# Patient Record
Sex: Male | Born: 1940 | Race: White | Hispanic: No | Smoking: Never smoker
Health system: Southern US, Community
[De-identification: ages and names within clinical notes are randomized; demographics above are authoritative.]

## PROBLEM LIST (undated history)

## (undated) DIAGNOSIS — I712 Thoracic aortic aneurysm, without rupture: Secondary | ICD-10-CM

## (undated) DIAGNOSIS — I7121 Aneurysm of the ascending aorta, without rupture: Secondary | ICD-10-CM

## (undated) DIAGNOSIS — Z8739 Personal history of other diseases of the musculoskeletal system and connective tissue: Secondary | ICD-10-CM

## (undated) DIAGNOSIS — R9431 Abnormal electrocardiogram [ECG] [EKG]: Secondary | ICD-10-CM

## (undated) DIAGNOSIS — Z87898 Personal history of other specified conditions: Secondary | ICD-10-CM

## (undated) DIAGNOSIS — F1020 Alcohol dependence, uncomplicated: Secondary | ICD-10-CM

## (undated) DIAGNOSIS — R600 Localized edema: Secondary | ICD-10-CM

## (undated) DIAGNOSIS — M109 Gout, unspecified: Secondary | ICD-10-CM

## (undated) DIAGNOSIS — I10 Essential (primary) hypertension: Secondary | ICD-10-CM

## (undated) HISTORY — DX: Personal history of other diseases of the musculoskeletal system and connective tissue: Z87.39

## (undated) HISTORY — DX: Essential (primary) hypertension: I10

## (undated) HISTORY — DX: Abnormal electrocardiogram (ECG) (EKG): R94.31

## (undated) HISTORY — DX: Personal history of other specified conditions: Z87.898

## (undated) HISTORY — PX: OTHER SURGICAL HISTORY: SHX169

## (undated) HISTORY — DX: Localized edema: R60.0

---

## 2004-01-09 ENCOUNTER — Ambulatory Visit: Payer: Self-pay | Admitting: Family Medicine

## 2004-01-13 ENCOUNTER — Ambulatory Visit: Payer: Self-pay | Admitting: Family Medicine

## 2004-02-12 ENCOUNTER — Ambulatory Visit: Payer: Self-pay | Admitting: Family Medicine

## 2004-04-09 ENCOUNTER — Ambulatory Visit: Payer: Self-pay | Admitting: *Deleted

## 2004-04-13 ENCOUNTER — Ambulatory Visit: Payer: Self-pay | Admitting: Family Medicine

## 2004-04-22 ENCOUNTER — Ambulatory Visit: Payer: Self-pay | Admitting: Family Medicine

## 2005-09-27 ENCOUNTER — Encounter: Admission: RE | Admit: 2005-09-27 | Discharge: 2005-09-27 | Payer: Self-pay | Admitting: Specialist

## 2015-06-24 DIAGNOSIS — Z961 Presence of intraocular lens: Secondary | ICD-10-CM | POA: Diagnosis not present

## 2015-06-24 DIAGNOSIS — H26493 Other secondary cataract, bilateral: Secondary | ICD-10-CM | POA: Diagnosis not present

## 2015-06-24 DIAGNOSIS — H353121 Nonexudative age-related macular degeneration, left eye, early dry stage: Secondary | ICD-10-CM | POA: Diagnosis not present

## 2015-06-24 DIAGNOSIS — H353111 Nonexudative age-related macular degeneration, right eye, early dry stage: Secondary | ICD-10-CM | POA: Diagnosis not present

## 2016-08-29 ENCOUNTER — Ambulatory Visit
Admission: RE | Admit: 2016-08-29 | Discharge: 2016-08-29 | Disposition: A | Discharge status: Home / Self Care | Payer: Self-pay | Source: Ambulatory Visit | Attending: Internal Medicine | Admitting: Internal Medicine

## 2016-08-29 ENCOUNTER — Other Ambulatory Visit: Payer: Self-pay | Admitting: Internal Medicine

## 2016-08-29 DIAGNOSIS — R52 Pain, unspecified: Secondary | ICD-10-CM

## 2016-09-13 DIAGNOSIS — L03119 Cellulitis of unspecified part of limb: Secondary | ICD-10-CM | POA: Diagnosis not present

## 2016-09-13 DIAGNOSIS — M109 Gout, unspecified: Secondary | ICD-10-CM | POA: Diagnosis not present

## 2016-09-15 ENCOUNTER — Encounter: Payer: PPO | Attending: Surgery | Admitting: Surgery

## 2016-09-15 DIAGNOSIS — I10 Essential (primary) hypertension: Secondary | ICD-10-CM | POA: Insufficient documentation

## 2016-09-15 DIAGNOSIS — M109 Gout, unspecified: Secondary | ICD-10-CM | POA: Insufficient documentation

## 2016-09-15 DIAGNOSIS — S9002XA Contusion of left ankle, initial encounter: Secondary | ICD-10-CM | POA: Diagnosis not present

## 2016-09-15 DIAGNOSIS — M70862 Other soft tissue disorders related to use, overuse and pressure, left lower leg: Secondary | ICD-10-CM | POA: Insufficient documentation

## 2016-09-15 DIAGNOSIS — Z87891 Personal history of nicotine dependence: Secondary | ICD-10-CM | POA: Diagnosis not present

## 2016-09-15 DIAGNOSIS — S81812A Laceration without foreign body, left lower leg, initial encounter: Secondary | ICD-10-CM | POA: Diagnosis not present

## 2016-09-15 DIAGNOSIS — L03116 Cellulitis of left lower limb: Secondary | ICD-10-CM | POA: Diagnosis not present

## 2016-09-15 DIAGNOSIS — L97322 Non-pressure chronic ulcer of left ankle with fat layer exposed: Secondary | ICD-10-CM | POA: Diagnosis not present

## 2016-09-15 DIAGNOSIS — X58XXXA Exposure to other specified factors, initial encounter: Secondary | ICD-10-CM | POA: Insufficient documentation

## 2016-09-15 DIAGNOSIS — I890 Lymphedema, not elsewhere classified: Secondary | ICD-10-CM | POA: Insufficient documentation

## 2016-09-17 NOTE — Progress Notes (Signed)
[name], [name] ([id number]) Visit Report for 09/15/2016 Abuse/Suicide Risk Screen Details Patient Name: [name], [name] Date of Service: 09/15/2016 10:30 AM Medical Record Number: [id number] Patient Account Number: [id number] Date of Birth/Sex: April 26, 1940 [age] y.o. Male) Treating RN: Phillis HaggisPinkerton, Debi Primary Care Pristine Gladhill: Georgann HousekeeperHUSAIN, KARRAR Other Clinician: Referring Gearldean Lomanto: Smiley HousemanMELOY, ALEXANDER Treating Batoul Limes/Extender: Rudene ReBritto, Errol Weeks in Treatment: 0 Abuse/Suicide Risk Screen Items Answer ABUSE/SUICIDE RISK SCREEN: Has anyone close to you tried to hurt or harm you recentlyo No Do you feel uncomfortable with anyone in your familyo No Has anyone forced you do things that you didnot want to doo No Do you have any thoughts of harming yourselfo No Patient displays signs or symptoms of abuse and/or neglect. No Electronic Signature(s) Signed: 09/15/2016 4:08:27 PM By: Alejandro MullingPinkerton, Debra Entered By: Alejandro MullingPinkerton, Debra on 09/15/2016 11:18:10 [place], [name] ([id number]) -------------------------------------------------------------------------------- Activities of Daily Living Details Patient Name: [name], [name] Date of Service: 09/15/2016 10:30 AM Medical Record Number: [id number] Patient Account Number: [id number] Date of Birth/Sex: April 26, 1940 [age] y.o. Male) Treating RN: Phillis HaggisPinkerton, Debi Primary Care Alannie Amodio: Georgann HousekeeperHUSAIN, KARRAR Other Clinician: Referring Logan Vegh: Smiley HousemanMELOY, ALEXANDER Treating Lynnwood Beckford/Extender: Rudene ReBritto, Errol Weeks in Treatment: 0 Activities of Daily Living Items Answer Activities of Daily Living (Please select one for each item) Drive Automobile Completely Able Take Medications Completely Able Use Telephone Completely Able Care for Appearance Completely Able Use Toilet Completely Able Bath / Shower Completely Able Dress Self Completely Able Feed Self Completely Able Walk Completely Able Get In / Out Bed Completely Able Housework Completely Able Prepare Meals Completely Able Handle  Money Completely Able Shop for Self Completely Able Electronic Signature(s) Signed: 09/15/2016 4:08:27 PM By: Alejandro MullingPinkerton, Debra Entered By: Alejandro MullingPinkerton, Debra on 09/15/2016 11:18:26 [place]GH[place], [name] ([id number]) -------------------------------------------------------------------------------- Education Assessment Details Patient Name: [name], [name] Date of Service: 09/15/2016 10:30 AM Medical Record Number: [id number] Patient Account Number: [id number] Date of Birth/Sex: April 26, 1940 [age] y.o. Male) Treating RN: Phillis HaggisPinkerton, Debi Primary Care Dayle Sherpa: Georgann HousekeeperHUSAIN, KARRAR Other Clinician: Referring Geron Mulford: Smiley HousemanMELOY, ALEXANDER Treating Cherilynn Schomburg/Extender: Rudene ReBritto, Errol Weeks in Treatment: 0 Primary Learner Assessed: Patient Learning Preferences/Education Level/Primary Language Learning Preference: Explanation, Printed Material Highest Education Level: College or Above Preferred Language: English Cognitive Barrier Assessment/Beliefs Language Barrier: No Translator Needed: No Memory Deficit: No Emotional Barrier: No Cultural/Religious Beliefs Affecting Medical No Care: Physical Barrier Assessment Impaired Vision: Yes Glasses Impaired Hearing: No Decreased Hand dexterity: No Knowledge/Comprehension Assessment Knowledge Level: Medium Comprehension Level: Medium Ability to understand written Medium instructions: Ability to understand verbal Medium instructions: Motivation Assessment Anxiety Level: Calm Cooperation: Cooperative Education Importance: Acknowledges Need Interest in Health Problems: Asks Questions Perception: Coherent Willingness to Engage in Self- Medium Management Activities: Readiness to Engage in Self- Medium Management Activities: Electronic Signature(s) [place]HUGHES, [place]MIKE ([id number]) Signed: 09/15/2016 4:08:27 PM By: Alejandro MullingPinkerton, Debra Entered By: Alejandro MullingPinkerton, Debra on 09/15/2016 11:19:01 [place]HUGHES, [name]  ([id number]) -------------------------------------------------------------------------------- Fall Risk Assessment Details Patient Name: [name], [name] Date of Service: 09/15/2016 10:30 AM Medical Record Number: [id number] Patient Account Number: [id number] Date of Birth/Sex: April 26, 1940 [age] y.o. Male) Treating RN: Phillis HaggisPinkerton, Debi Primary Care Eliese Kerwood: Georgann HousekeeperHUSAIN, KARRAR Other Clinician: Referring Hisae Decoursey: Smiley HousemanMELOY, ALEXANDER Treating Leylani Duley/Extender: Rudene ReBritto, Errol Weeks in Treatment: 0 Fall Risk Assessment Items Have you had 2 or more falls in the last 12 monthso 0 Yes Have you had any fall that resulted in injury in the last 12 monthso 0 Yes FALL RISK ASSESSMENT: History of falling - immediate or within 3 months 25 Yes Secondary diagnosis 0 No Ambulatory aid None/bed rest/wheelchair/nurse 0 No Crutches/cane/walker 0 No Furniture 0 No IV Access/Saline Lock 0 No  Gait/Training Normal/bed rest/immobile 0 No Weak 10 Yes Impaired 0 No Mental Status Oriented to own ability 0 No Electronic Signature(s) Signed: 09/15/2016 4:08:27 PM By: Alejandro Mulling Entered By: Alejandro Mulling on 09/15/2016 11:19:37 [place], [name] ([id number]) -------------------------------------------------------------------------------- Foot Assessment Details Patient Name: [name] Date of Service: 09/15/2016 10:30 AM Medical Record Number: [id number] Patient Account Number: [id number] Date of Birth/Sex: May 10, 1940 (76 y.o. Male) Treating RN: Phillis Haggis Primary Care Dalyn Kjos: Georgann Housekeeper Other Clinician: Referring Frantz Quattrone: Smiley Houseman Treating Esaias Cleavenger/Extender: Rudene Re in Treatment: 0 Foot Assessment Items Site Locations + = Sensation present, - = Sensation absent, C = Callus, U = Ulcer R = Redness, W = Warmth, M = Maceration, PU = Pre-ulcerative lesion F = Fissure, S = Swelling, D = Dryness Assessment Right: Left: Other Deformity: No No Prior Foot Ulcer: No No Prior Amputation:  No No Charcot Joint: No No Ambulatory Status: Ambulatory Without Help Gait: Steady Electronic Signature(s) Signed: 09/15/2016 4:08:27 PM By: Alejandro Mulling Entered By: Alejandro Mulling on 09/15/2016 11:22:50 [place], [name] ([id number]) -------------------------------------------------------------------------------- Nutrition Risk Assessment Details Patient Name: [name] Date of Service: 09/15/2016 10:30 AM Medical Record Number: [id number] Patient Account Number: [id number] Date of Birth/Sex: March 19, 1941 (76 y.o. Male) Treating RN: Phillis Haggis Primary Care Trystian Crisanto: Georgann Housekeeper Other Clinician: Referring Clancy Mullarkey: Smiley Houseman Treating Lyvonne Cassell/Extender: Rudene Re in Treatment: 0 Height (in): 72 Weight (lbs): 236 Body Mass Index (BMI): 32 Nutrition Risk Assessment Items NUTRITION RISK SCREEN: I have an illness or condition that made me change the kind and/or 2 Yes amount of food I eat I eat fewer than two meals per day 0 No I eat few fruits and vegetables, or milk products 0 No I have three or more drinks of beer, liquor or wine almost every day 0 No I have tooth or mouth problems that make it hard for me to eat 0 No I don't always have enough money to buy the food I need 0 No I eat alone most of the time 0 No I take three or more different prescribed or over-the-counter drugs a 0 No day Without wanting to, I have lost or gained 10 pounds in the last six 0 No months I am not always physically able to shop, cook and/or feed myself 0 No Nutrition Protocols Good Risk Protocol Moderate Risk Protocol Electronic Signature(s) Signed: 09/15/2016 4:08:27 PM By: Alejandro Mulling Entered By: Alejandro Mulling on 09/15/2016 11:20:06

## 2016-09-17 NOTE — Progress Notes (Signed)
VICTORY, STROLLO ([id number]) Visit Report for 09/15/2016 Chief Complaint Document Details Patient Name: [name], [name] Date of Service: 09/15/2016 10:30 AM Medical Record Number: [id number] Patient Account Number: [id number] Date of Birth/Sex: 09-Apr-1941 (75 y.o. Male) Treating RN: Phillis Haggis Primary Care Provider: Georgann Housekeeper Other Clinician: Referring Provider: Smiley Houseman Treating Provider/Extender: Rudene Re in Treatment: 0 Information Obtained from: Patient Chief Complaint Patient seen for complaints of Non-Healing Wound left lower lateral leg for 3 weeks Electronic Signature(s) Signed: 09/15/2016 12:43:58 PM By: Evlyn Kanner MD, FACS Entered By: Evlyn Kanner on 09/15/2016 12:43:58 [place], [name] ([id number]) -------------------------------------------------------------------------------- Debridement Details Patient Name: [name] Date of Service: 09/15/2016 10:30 AM Medical Record Number: [id number] Patient Account Number: [id number] Date of Birth/Sex: 1940-12-18 (76 y.o. Male) Treating RN: Phillis Haggis Primary Care Provider: Georgann Housekeeper Other Clinician: Referring Provider: Smiley Houseman Treating Provider/Extender: Rudene Re in Treatment: 0 Debridement Performed for Wound #1 Left,Lateral Lower Leg Assessment: Performed By: Physician Evlyn Kanner, MD Debridement: Debridement Pre-procedure Verification/Time Out Yes - 11:44 Taken: Start Time: 11:45 Pain Control: Lidocaine 4% Topical Solution Level: Skin/Subcutaneous Tissue Total Area Debrided (L x 2.5 (cm) x 1.7 (cm) = 4.25 (cm) W): Tissue and other Viable, Non-Viable, Exudate, Fibrin/Slough, Subcutaneous material debrided: Instrument: Forceps, Scissors Bleeding: Large Hemostasis Achieved: Silver Nitrate End Time: 11:51 Procedural Pain: 0 Post Procedural Pain: 0 Response to Treatment: Procedure was tolerated well Post Debridement Measurements of Total Wound Length: (cm)  2.5 Width: (cm) 1.7 Depth: (cm) 0.8 Volume: (cm) 2.67 Character of Wound/Ulcer Post Requires Further Debridement Debridement: Post Procedure Diagnosis Same as Pre-procedure Notes Sharp debridement was done with a forcep and scissors and the eschar was removed. Large amount of organized clot was removed and some brisk bleeding from the skin edges was controlled with Silver nitrate. The wound was packed with silver alginate rope and a light compression was applied in place Undermining 3cm at 12 o'clock. ESPEN, BETHEL ([id number]) Electronic Signature(s) Signed: 09/15/2016 12:43:40 PM By: Evlyn Kanner MD, FACS Signed: 09/15/2016 4:08:27 PM By: Alejandro Mulling Entered By: Evlyn Kanner on 09/15/2016 12:43:40 [place], [name] ([id number]) -------------------------------------------------------------------------------- HPI Details Patient Name: [name] Date of Service: 09/15/2016 10:30 AM Medical Record Number: [id number] Patient Account Number: [id number] Date of Birth/Sex: 1941-02-22 (76 y.o. Male) Treating RN: Phillis Haggis Primary Care Provider: Georgann Housekeeper Other Clinician: Referring Provider: Smiley Houseman Treating Provider/Extender: Rudene Re in Treatment: 0 History of Present Illness Location: left lower lateral leg Quality: Patient reports experiencing a sharp pain to affected area(s). Severity: Patient states wound are getting worse. Duration: Patient has had the wound for < 4 weeks prior to presenting for treatment Timing: Pain in wound is Intermittent (comes and goes Context: The wound occurred when the patient tripped and had a fall and injured his left leg Modifying Factors: Other treatment(s) tried include:had gone to an urgent care couple of times a day taken an x-ray and was put on antibiotics Associated Signs and Symptoms: Patient reports having increase swelling and redness to the leg HPI Description: 76 year old gentleman who had a injury to the  left ankle and foot 3 weeks ago was seen recently in the ER after he had swelling and redness of the left ankle. He is also known to have a flareup of gout recently. After he was examined in the urgent care he was placed on Keflex and Bactrim and referred to the wound center. A recent x-ray of the left ankle did not show any fractures or abnormality. The patient doesn't take very good care  of his health has never been worked up for chronic lymphedema which she's had and sees a physician about once a year. He has not been aware that he has bilateral lower extremity lymphedema. he does not smoke. He has never had a workup for his lymphedema Electronic Signature(s) Signed: 09/15/2016 12:46:20 PM By: Evlyn Kanner MD, FACS Previous Signature: 09/15/2016 11:18:09 AM Version By: Evlyn Kanner MD, FACS Entered By: Evlyn Kanner on 09/15/2016 12:46:20 [place], SEAMAN ([id number]) -------------------------------------------------------------------------------- Physical Exam Details Patient Name: [name] Date of Service: 09/15/2016 10:30 AM Medical Record Number: [id number] Patient Account Number: [id number] Date of Birth/Sex: August 10, 1940 (76 y.o. Male) Treating RN: Phillis Haggis Primary Care Provider: Georgann Housekeeper Other Clinician: Referring Provider: Smiley Houseman Treating Provider/Extender: Rudene Re in Treatment: 0 Constitutional . Pulse regular. Respirations normal and unlabored. Afebrile. . Eyes Nonicteric. Reactive to light. Ears, Nose, Mouth, and Throat Lips, teeth, and gums WNL.Marland Kitchen Moist mucosa without lesions. Neck supple and nontender. No palpable supraclavicular or cervical adenopathy. Normal sized without goiter. Respiratory WNL. No retractions.. Cardiovascular Pedal Pulses WNL. ABI was noncompressible. he has bilateral stage I lymphedema.. Chest Breasts symmetical and no nipple discharge.. Breast tissue WNL, no masses, lumps, or tenderness.. Gastrointestinal  (GI) Abdomen without masses or tenderness.. No liver or spleen enlargement or tenderness.. Lymphatic No adneopathy. No adenopathy. No adenopathy. Musculoskeletal Adexa without tenderness or enlargement.. Digits and nails w/o clubbing, cyanosis, infection, petechiae, ischemia, or inflammatory conditions.. Integumentary (Hair, Skin) No suspicious lesions. No crepitus or fluctuance. No peri-wound warmth or erythema. No masses.Marland Kitchen Psychiatric Judgement and insight Intact.. No evidence of depression, anxiety, or agitation.. Notes The left lower leg laterally, a bit superior to his lateral malleolus has a thick eschar with surrounding cellulitis. Sharp debridement was done with a forcep and scissors and the eschar was removed. Large amount of organized clot was removed and some brisk bleeding from the skin edges was controlled with Silver nitrate. The wound was packed with silver alginate rope and a light compression was applied in place Undermining 3cm at 12 o'clock. Electronic Signature(s) [place], [place] ([id number]) Signed: 09/15/2016 12:47:42 PM By: Evlyn Kanner MD, FACS Entered By: Evlyn Kanner on 09/15/2016 12:47:42 [place], [name] ([id number]) -------------------------------------------------------------------------------- Physician Orders Details Patient Name: [name] Date of Service: 09/15/2016 10:30 AM Medical Record Number: [id number] Patient Account Number: [id number] Date of Birth/Sex: Dec 22, 1940 (76 y.o. Male) Treating RN: Phillis Haggis Primary Care Provider: Georgann Housekeeper Other Clinician: Referring Provider: Smiley Houseman Treating Provider/Extender: Rudene Re in Treatment: 0 Verbal / Phone Orders: Yes ClinicianAshok Cordia, Debi Read Back and Verified: Yes Diagnosis Coding Wound Cleansing Wound #1 Left,Lateral Lower Leg o Clean wound with Normal Saline. o Cleanse wound with mild soap and water Anesthetic Wound #1 Left,Lateral Lower Leg o Topical  Lidocaine 4% cream applied to wound bed prior to debridement - for clinic use Primary Wound Dressing Wound #1 Left,Lateral Lower Leg o Aquacel Ag - rope, pack into undermining lightly Secondary Dressing Wound #1 Left,Lateral Lower Leg o ABD pad o Dry Gauze Dressing Change Frequency Wound #1 Left,Lateral Lower Leg o Three times weekly Follow-up Appointments Wound #1 Left,Lateral Lower Leg o Return Appointment in 1 week. Edema Control Wound #1 Left,Lateral Lower Leg o Kerlix and Coban - Left Lower Extremity - 3cm from toes to 3 cm from knee unna to anchor o Elevate legs to the level of the heart and pump ankles as often as possible Additional Orders / Instructions o Increase protein intake. o Other: - Make an appointment to see your  primary care physician. Trevor LackHUGHES, [name] ([id number]) Home Health Wound #1 Left,Lateral Lower Leg o Initiate Home Health for Skilled Nursing o Home Health Nurse may visit PRN to address patientos wound care needs. o FACE TO FACE ENCOUNTER: MEDICARE and MEDICAID PATIENTS: I certify that this patient is under my care and that I had a face-to-face encounter that meets the physician face-to-face encounter requirements with this patient on this date. The encounter with the patient was in whole or in part for the following MEDICAL CONDITION: (primary reason for Home Healthcare) MEDICAL NECESSITY: I certify, that based on my findings, NURSING services are a medically necessary home health service. HOME BOUND STATUS: I certify that my clinical findings support that this patient is homebound (i.e., Due to illness or injury, pt requires aid of supportive devices such as crutches, cane, wheelchairs, walkers, the use of special transportation or the assistance of another person to leave their place of residence. There is a normal inability to leave the home and doing so requires considerable and taxing effort. Other absences are for medical  reasons / religious services and are infrequent or of short duration when for other reasons). o If current dressing causes regression in wound condition, may D/C ordered dressing product/s and apply Normal Saline Moist Dressing daily until next Wound Healing Center / Other MD appointment. Notify Wound Healing Center of regression in wound condition at [phone]. o Please direct any NON-WOUND related issues/requests for orders to patient's Primary Care Physician Medications-please add to medication list. Wound #1 Left,Lateral Lower Leg o P.O. Antibiotics - Continue taking your oral antibiotics as prescribed. Electronic Signature(s) Signed: 09/15/2016 4:08:27 PM By: Alejandro MullingPinkerton, Debra Signed: 09/15/2016 4:20:02 PM By: Evlyn KannerBritto, Raymon Schlarb MD, FACS Entered By: Alejandro MullingPinkerton, Debra on 09/15/2016 12:38:44 [name] ([id number]) -------------------------------------------------------------------------------- Problem List Details Patient Name: Trevor LackHUGHES, [name] Date of Service: 09/15/2016 10:30 AM Medical Record Number: [id number] Patient Account Number: [id number] Date of Birth/Sex: 10/13/1940 [age] y.o. Male) Treating RN: Phillis HaggisPinkerton, Debi Primary Care Provider: Georgann HousekeeperHUSAIN, KARRAR Other Clinician: Referring Provider: Smiley HousemanMELOY, ALEXANDER Treating Provider/Extender: Rudene ReBritto, Zyan Coby Weeks in Treatment: 0 Active Problems ICD-10 Encounter Code Description Active Date Diagnosis [phone] Laceration without foreign body, left lower leg, initial 09/15/2016 Yes encounter M70.862 Other soft tissue disorders related to use, overuse and 09/15/2016 Yes pressure, left lower leg I89.0 Lymphedema, not elsewhere classified 09/15/2016 Yes L03.116 Cellulitis of left lower limb 09/15/2016 Yes 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 Inactive Problems Resolved Problems Electronic Signature(s) Signed: 09/15/2016 12:51:21 PM By: Evlyn KannerBritto, Emmalie Haigh MD, FACS Previous Signature: 09/15/2016  12:41:58 PM Version By: Evlyn KannerBritto, Millette Halberstam MD, FACS Entered By: Evlyn KannerBritto, Dulcie Gammon on 09/15/2016 12:51:21 [name] ([id number]) -------------------------------------------------------------------------------- Progress Note Details Patient Name: Trevor LackHUGHES, [name] Date of Service: 09/15/2016 10:30 AM Medical Record Number: [id number] Patient Account Number: [id number] Date of Birth/Sex: 10/13/1940 [age] y.o. Male) Treating RN: Phillis HaggisPinkerton, Debi Primary Care Provider: Georgann HousekeeperHUSAIN, KARRAR Other Clinician: Referring Provider: Smiley HousemanMELOY, ALEXANDER Treating Provider/Extender: Rudene ReBritto, Delbert Vu Weeks in Treatment: 0 Subjective Chief Complaint Information obtained from Patient Patient seen for complaints of Non-Healing Wound left lower lateral leg for 3 weeks History of Present Illness (HPI) The following HPI elements were documented for the patient's wound: Location: left lower lateral leg Quality: Patient reports experiencing a sharp pain to affected area(s). Severity: Patient states wound are getting worse. Duration: Patient has had the wound for < 4 weeks prior to presenting for treatment Timing: Pain in wound is Intermittent (comes and goes Context: The wound occurred when the patient tripped and had  a fall and injured his left leg Modifying Factors: Other treatment(s) tried include:had gone to an urgent care couple of times a day taken an x-ray and was put on antibiotics Associated Signs and Symptoms: Patient reports having increase swelling and redness to the leg 76 year old gentleman who had a injury to the left ankle and foot 3 weeks ago was seen recently in the ER after he had swelling and redness of the left ankle. He is also known to have a flareup of gout recently. After he was examined in the urgent care he was placed on Keflex and Bactrim and referred to the wound center. A recent x-ray of the left ankle did not show any fractures or abnormality. The patient doesn't take very good care of his health has  never been worked up for chronic lymphedema which she's had and sees a physician about once a year. He has not been aware that he has bilateral lower extremity lymphedema. he does not smoke. He has never had a workup for his lymphedema Wound History Patient presents with 1 open wound that has been present for approximately 3 weeks. Patient has been treating wound in the following manner: nothing. Laboratory tests have not been performed in the last month. Patient reportedly has not tested positive for an antibiotic resistant organism. Patient reportedly has not tested positive for osteomyelitis. Patient reportedly has not had testing performed to evaluate circulation in the legs. Patient experiences the following problems associated with their wounds: swelling. Patient History Information obtained from Patient. Allergies [place], [name] ([id number]) NKDA Family History Cancer - Siblings, Father, No family history of Diabetes, Heart Disease, Hypertension. Social History Former smoker - as a teenager, Marital Status - Married, Alcohol Use - Daily, Drug Use - No History, Caffeine Use - Daily. Medical History Eyes Patient has history of Cataracts - surgery Cardiovascular Patient has history of Hypertension Musculoskeletal Patient has history of Gout Review of Systems (ROS) Constitutional Symptoms (General Health) The patient has no complaints or symptoms. Eyes Complains or has symptoms of Glasses / Contacts. Ear/Nose/Mouth/Throat The patient has no complaints or symptoms. Hematologic/Lymphatic The patient has no complaints or symptoms. Respiratory The patient has no complaints or symptoms. Cardiovascular tachycardia Gastrointestinal The patient has no complaints or symptoms. Endocrine The patient has no complaints or symptoms. Genitourinary The patient has no complaints or symptoms. Immunological The patient has no complaints or symptoms. Integumentary (Skin) Complains or  has symptoms of Wounds. Neurologic The patient has no complaints or symptoms. Oncologic The patient has no complaints or symptoms. Psychiatric The patient has no complaints or symptoms. [place], [name] ([id number]) Medications Bactrim DS 800 mg-160 mg tablet oral 1 1 tablet oral every 12 hours for 10 days Keflex 500 mg capsule oral 1 1 capsule oral every six hours for 10 days Objective Constitutional Pulse regular. Respirations normal and unlabored. Afebrile. Vitals Time Taken: 11:11 AM, Height: 72 in, Source: Stated, Weight: 236 lbs, Source: Measured, BMI: 32, Temperature: 98.4 F, Pulse: 67 bpm, Respiratory Rate: 18 breaths/min, Blood Pressure: 155/77 mmHg. Eyes Nonicteric. Reactive to light. Ears, Nose, Mouth, and Throat Lips, teeth, and gums WNL.Marland Kitchen Moist mucosa without lesions. Neck supple and nontender. No palpable supraclavicular or cervical adenopathy. Normal sized without goiter. Respiratory WNL. No retractions.. Cardiovascular Pedal Pulses WNL. ABI was noncompressible. he has bilateral stage I lymphedema.. Chest Breasts symmetical and no nipple discharge.. Breast tissue WNL, no masses, lumps, or tenderness.. Gastrointestinal (GI) Abdomen without masses or tenderness.. No liver or spleen enlargement or tenderness.. Lymphatic No adneopathy.  No adenopathy. No adenopathy. Musculoskeletal Adexa without tenderness or enlargement.. Digits and nails w/o clubbing, cyanosis, infection, petechiae, ischemia, or inflammatory conditions.Marland Kitchen Psychiatric Judgement and insight Intact.. No evidence of depression, anxiety, or agitation.. General Notes: The left lower leg laterally, a bit superior to his lateral malleolus has a thick eschar with [place], [name] ([id number]) surrounding cellulitis. Sharp debridement was done with a forcep and scissors and the eschar was removed. Large amount of organized clot was removed and some brisk bleeding from the skin edges was controlled with Silver  nitrate. The wound was packed with silver alginate rope and a light compression was applied in place Undermining 3cm at 12 o'clock. Integumentary (Hair, Skin) No suspicious lesions. No crepitus or fluctuance. No peri-wound warmth or erythema. No masses.. Wound #1 status is Open. Original cause of wound was Trauma. The wound is located on the Left,Lateral Lower Leg. The wound measures 2.5cm length x 1.7cm width x 0.1cm depth; 3.338cm^2 area and 0.334cm^3 volume. There is no tunneling or undermining noted. There is a large amount of serosanguineous drainage noted. The wound margin is distinct with the outline attached to the wound base. There is no granulation within the wound bed. There is a large (67-100%) amount of necrotic tissue within the wound bed including Eschar and Adherent Slough. Periwound temperature was noted as No Abnormality. The periwound has tenderness on palpation. 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 An x-ray done earlier did not show any acute pathology but there was cellulitis on examination and he was treated with antibiotics which he is still taking orally. After a thorough review today I have recommended: 1. Packing the wound with silver alginate rope and applying a light Kerlix and Coban dressing 2. Elevation and exercise. 3. Complete his course of antibiotics 4. If discharge from the wound is perfuse either  blood or purulent material he should report to the ER immediately. 5. Regular visits to the wound center I have answered all his questions and he will be compliant [place], [name] ([id number]) Procedures Wound #1 Pre-procedure diagnosis of Wound #1 is a Trauma, Other located on the Left,Lateral Lower Leg . There was a Skin/Subcutaneous Tissue Debridement ([id number]) debridement with total area of 4.25 sq cm performed by Evlyn Kanner, MD. with the following instrument(s): Forceps and Scissors to remove Viable and Non-Viable tissue/material including Exudate, Fibrin/Slough, and Subcutaneous after achieving pain control using Lidocaine 4% Topical Solution. A time out was conducted at 11:44, prior to the start of the procedure. A Large amount of bleeding was controlled with Silver Nitrate. The procedure was tolerated well with a pain level of 0 throughout and a pain level of 0 following the procedure. Post Debridement Measurements: 2.5cm length x 1.7cm width x 0.8cm depth; 2.67cm^3 volume. Character of Wound/Ulcer Post Debridement requires further debridement. Post procedure Diagnosis Wound #1: Same as Pre-Procedure General Notes: Sharp debridement was done with a forcep and scissors and the eschar was removed. Large amount of organized clot was removed and some brisk  bleeding from the skin edges was controlled with Silver nitrate. The wound was packed with silver alginate rope and a light compression was applied in place Undermining 3cm at 12 o'clock.. Plan Wound Cleansing: Wound #1 Left,Lateral Lower Leg: Clean wound with Normal Saline. Cleanse wound with mild soap and water Anesthetic: Wound #1 Left,Lateral Lower Leg: Topical Lidocaine 4% cream applied to wound bed prior to debridement - for clinic use Primary Wound Dressing: Wound #1 Left,Lateral Lower Leg: Aquacel Ag - rope, pack into undermining lightly Secondary Dressing: Wound #1 Left,Lateral Lower Leg: ABD pad Dry  Gauze Dressing Change Frequency: Wound #1 Left,Lateral Lower Leg: Three times weekly Follow-up Appointments: Wound #1 Left,Lateral Lower Leg: Return Appointment in 1 week. Edema Control: Wound #1 Left,Lateral Lower Leg: Kerlix and Coban - Left Lower Extremity - 3cm from toes to 3 cm from knee unna to anchor Elevate legs to the level of the heart and pump ankles as often as possible Additional Orders / InstructionsZaquan Duffner, [name] ([id number]) Increase protein intake. Other: - Make an appointment to see your primary care physician. Home Health: Wound #1 Left,Lateral Lower Leg: Initiate Home Health for Skilled Nursing Home Health Nurse may visit PRN to address patient s wound care needs. FACE TO FACE ENCOUNTER: MEDICARE and MEDICAID PATIENTS: I certify that this patient is under my care and that I had a face-to-face encounter that meets the physician face-to-face encounter requirements with this patient on this date. The encounter with the patient was in whole or in part for the following MEDICAL CONDITION: (primary reason for Home Healthcare) MEDICAL NECESSITY: I certify, that based on my findings, NURSING services are a medically necessary home health service. HOME BOUND STATUS: I certify that my clinical findings support that this patient is homebound (i.e., Due to illness or injury, pt requires aid of supportive devices such as crutches, cane, wheelchairs, walkers, the use of special transportation or the assistance of another person to leave their place of residence. There is a normal inability to leave the home and doing so requires considerable and taxing effort. Other absences are for medical reasons / religious services and are infrequent or of short duration when for other reasons). If current dressing causes regression in wound condition, may D/C ordered dressing product/s and apply Normal Saline Moist Dressing daily until next Wound Healing Center / Other MD appointment. Notify  Wound Healing Center of regression in wound condition at [phone]. Please direct any NON-WOUND related issues/requests for orders to patient's Primary Care Physician Medications-please add to medication list.: Wound #1 Left,Lateral Lower Leg: P.O. Antibiotics - Continue taking your oral antibiotics as prescribed. this 76 year old gentleman who does not take very good care of his health has had a injury to his left lower extremity which after review today shows significant amount of soft tissue damage with a large hematoma and significant cellulitis. An x-ray done earlier did not show any acute pathology but there was cellulitis on examination and he was treated with antibiotics which he is still taking orally. After a thorough review today I have recommended: 1. Packing the wound with silver alginate rope and applying a light Kerlix and Coban dressing 2. Elevation and exercise. 3. Complete his course of antibiotics 4. If discharge from the wound is perfuse either blood or purulent material he should report to the ER immediately. 5. Regular visits to the wound center I have answered all his questions and he will be compliant Electronic Signature(s) Signed: 09/15/2016 12:52:23 PM By: Evlyn Kanner MD, FACS Previous  Signature: 09/15/2016 12:50:09 PM Version By: Evlyn Kanner MD, FACS [place], [name] ([id number]) Entered By: Evlyn Kanner on 09/15/2016 12:52:23 [place], Kathlene November ([id number]) -------------------------------------------------------------------------------- ROS/PFSH Details Patient Name: [name] Date of Service: 09/15/2016 10:30 AM Medical Record Number: [id number] Patient Account Number: [id number] Date of Birth/Sex: 1940/12/25 (76 y.o. Male) Treating RN: Phillis Haggis Primary Care Provider: Georgann Housekeeper Other Clinician: Referring Provider: Smiley Houseman Treating Provider/Extender: Rudene Re in Treatment: 0 Information Obtained From Patient Wound  History Do you currently have one or more open woundso Yes How many open wounds do you currently haveo 1 Approximately how long have you had your woundso 3 weeks How have you been treating your wound(s) until nowo nothing Has your wound(s) ever healed and then re-openedo No Have you had any lab work done in the past montho No Have you tested positive for an antibiotic resistant organism (MRSA, VRE)o No Have you tested positive for osteomyelitis (bone infection)o No Have you had any tests for circulation on your legso No Have you had other problems associated with your woundso Swelling Eyes Complaints and Symptoms: Positive for: Glasses / Contacts Medical History: Positive for: Cataracts - surgery Integumentary (Skin) Complaints and Symptoms: Positive for: Wounds Constitutional Symptoms (General Health) Complaints and Symptoms: No Complaints or Symptoms Ear/Nose/Mouth/Throat Complaints and Symptoms: No Complaints or Symptoms Hematologic/Lymphatic Complaints and Symptoms: No Complaints or Symptoms Gebert, [name] ([id number]) Respiratory Complaints and Symptoms: No Complaints or Symptoms Cardiovascular Complaints and Symptoms: Review of System Notes: tachycardia Medical History: Positive for: Hypertension Gastrointestinal Complaints and Symptoms: No Complaints or Symptoms Endocrine Complaints and Symptoms: No Complaints or Symptoms Genitourinary Complaints and Symptoms: No Complaints or Symptoms Immunological Complaints and Symptoms: No Complaints or Symptoms Musculoskeletal Medical History: Positive for: Gout Neurologic Complaints and Symptoms: No Complaints or Symptoms Oncologic Complaints and Symptoms: No Complaints or Symptoms Psychiatric Complaints and Symptoms: No Complaints or Symptoms Wooding, [name] ([id number]) HBO Extended History Items Eyes: Cataracts Immunizations Pneumococcal Vaccine: Received Pneumococcal Vaccination: Yes Family and Social  History Cancer: Yes - Siblings, Father; Diabetes: No; Heart Disease: No; Hypertension: No; Former smoker - as a teenager; Marital Status - Married; Alcohol Use: Daily; Drug Use: No History; Caffeine Use: Daily; Financial Concerns: No; Food, Clothing or Shelter Needs: No; Support System Lacking: No; Transportation Concerns: No; Advanced Directives: No; Patient does not want information on Advanced Directives; Do not resuscitate: No; Living Will: No; Medical Power of Attorney: No Physician Affirmation I have reviewed and agree with the above information. Electronic Signature(s) Signed: 09/15/2016 4:08:27 PM By: Alejandro Mulling Signed: 09/15/2016 4:20:02 PM By: Evlyn Kanner MD, FACS Entered By: Evlyn Kanner on 09/15/2016 12:39:10 [facility], [facility] ([id number]) -------------------------------------------------------------------------------- SuperBill Details Patient Name: [name] Date of Service: 09/15/2016 Medical Record Number: [id number] Patient Account Number: [id number] Date of Birth/Sex: 1941/01/24 (76 y.o. Male) Treating RN: Phillis Haggis Primary Care Provider: Georgann Housekeeper Other Clinician: Referring Provider: Smiley Houseman Treating Provider/Extender: Rudene Re in Treatment: 0 Diagnosis Coding ICD-10 Codes Code Description [phone] Laceration without foreign body, left lower leg, initial encounter [phone] Other soft tissue disorders related to use, overuse and pressure, left lower leg I89.0 Lymphedema, not elsewhere classified L03.116 Cellulitis of left lower limb L97.322 Non-pressure chronic ulcer of left ankle with fat layer exposed Facility Procedures CPT4: Description Modifier Quantity Code [id number] 99213 - WOUND CARE VISIT-LEV 3 EST PT 1 CPT4: 41660630 11042 - DEB SUBQ TISSUE 20 SQ CM/< 1 ICD-10 Description Diagnosis S81.812A Laceration without foreign body, left lower leg, initial encounter Z60.109 Other soft tissue disorders related  to use, overuse and  pressure, left lower leg I89.0  Lymphedema, not elsewhere classified L03.116 Cellulitis of left lower limb Physician Procedures CPT4: Description Modifier Quantity Code [id number] 99204 - WC PHYS LEVEL 4 - NEW PT 25 1 ICD-10 Description Diagnosis S81.812A Laceration without foreign body, left lower leg, initial encounter M70.862 Other soft tissue disorders related to use, overuse  and pressure, left lower leg I89.0 Lymphedema, not elsewhere classified L03.116 Cellulitis of left lower limb CPT4: 4782956 11042 - WC PHYS SUBQ TISS 20 SQ CM 1 Copland, [name] ([id number]) Electronic Signature(s) Signed: 09/15/2016 4:08:27 PM By: Alejandro Mulling Signed: 09/15/2016 4:20:02 PM By: Evlyn Kanner MD, FACS Previous Signature: 09/15/2016 12:52:43 PM Version By: Evlyn Kanner MD, FACS Previous Signature: 09/15/2016 12:50:35 PM Version By: Evlyn Kanner MD, FACS Entered By: Alejandro Mulling on 09/15/2016 14:19:40

## 2016-09-17 NOTE — Progress Notes (Signed)
[name], [name] ([id number]) Visit Report for 09/15/2016 Allergy List Details Patient Name: [name], [name] Date of Service: 09/15/2016 10:30 AM Medical Record Number: [id number] Patient Account Number: [id number] Date of Birth/Sex: 08-14-1940 (76 y.o. Male) Treating RN: Phillis Haggis Primary Care [name]: Georgann Housekeeper Other Clinician: Referring Dua Mehler: Smiley Houseman Treating Heaven Wandell/Extender: Rudene Re in Treatment: 0 Allergies Active Allergies NKDA Allergy Notes Electronic Signature(s) Signed: 09/15/2016 4:08:27 PM By: Alejandro Mulling Entered By: Alejandro Mulling on 09/15/2016 11:12:55 [name], [name] ([id number]) -------------------------------------------------------------------------------- Arrival Information Details Patient Name: [name] Date of Service: 09/15/2016 10:30 AM Medical Record Number: [id number] Patient Account Number: [id number] Date of Birth/Sex: [birth year] (76 y.o. Male) Treating RN: Phillis Haggis Primary Care Ely Spragg: Georgann Housekeeper Other Clinician: Referring Mercades Bajaj: Smiley Houseman Treating Nazaiah Navarrete/Extender: Rudene Re in Treatment: 0 Visit Information Patient Arrived: Ambulatory Arrival Time: 11:09 Accompanied By: self Transfer Assistance: None Patient Identification Verified: Yes Secondary Verification Process Yes Completed: Patient Requires Transmission- No Based Precautions: Patient Has Alerts: Yes Patient Alerts: L ABI non- compressible Electronic Signature(s) Signed: 09/15/2016 4:08:27 PM By: Alejandro Mulling Entered By: Alejandro Mulling on 09/15/2016 11:26:14 [name], [name] ([id number]) -------------------------------------------------------------------------------- Clinic Level of Care Assessment Details Patient Name: [name] Date of Service: 09/15/2016 10:30 AM Medical Record Number: [id number] Patient Account Number: [id number] Date of Birth/Sex: Sep 23, 1940 (77 y.o. Male) Treating RN: Phillis Haggis Primary Care Ulrick Methot: Georgann Housekeeper Other Clinician: Referring Nasif Bos: Smiley Houseman Treating Sumayyah Custodio/Extender: Rudene Re in Treatment: 0 Clinic Level of Care Assessment Items TOOL 1 Quantity Score X - Use when EandM and Procedure is performed on INITIAL visit 1 0 ASSESSMENTS - Nursing Assessment / Reassessment X - General Physical Exam (combine w/ comprehensive assessment (listed just 1 20 below) when performed on new pt. evals) X - Comprehensive Assessment (HX, ROS, Risk Assessments, Wounds Hx, etc.) 1 25 ASSESSMENTS - Wound and Skin Assessment / Reassessment []  - Dermatologic / Skin Assessment (not related to wound area) 0 ASSESSMENTS - Ostomy and/or Continence Assessment and Care []  - Incontinence Assessment and Management 0 []  - Ostomy Care Assessment and Management (repouching, etc.) 0 PROCESS - Coordination of Care []  - Simple Patient / Family Education for ongoing care 0 X - Complex (extensive) Patient / Family Education for ongoing care 1 20 X - Staff obtains Chiropractor, Records, Test Results / Process Orders 1 10 X - Staff telephones HHA, Nursing Homes / Clarify orders / etc 1 10 []  - Routine Transfer to another Facility (non-emergent condition) 0 []  - Routine Hospital Admission (non-emergent condition) 0 X - New Admissions / Manufacturing engineer / Ordering NPWT, Apligraf, etc. 1 15 []  - Emergency Hospital Admission (emergent condition) 0 PROCESS - Special Needs []  - Pediatric / Minor Patient Management 0 []  - Isolation Patient Management 0 [name], [name] ([id number]) []  - Hearing / Language / Visual special needs 0 []  - Assessment of Community assistance (transportation, D/C planning, etc.) 0 []  - Additional assistance / Altered mentation 0 []  - Support Surface(s) Assessment (bed, cushion, seat, etc.) 0 INTERVENTIONS - Miscellaneous []  - External ear exam 0 []  - Patient Transfer (multiple staff / Nurse, adult / Similar devices) 0 []  - Simple  Staple / Suture removal (25 or less) 0 []  - Complex Staple / Suture removal (26 or more) 0 []  - Hypo/Hyperglycemic Management (do not check if billed separately) 0 X - Ankle / Brachial Index (ABI) - do not check if billed separately 1 15 Has the patient been seen at the hospital within the last three years: Yes Total  Score: 115 Level Of Care: New/Established - Level 3 Electronic Signature(s) Signed: 09/15/2016 4:08:27 PM By: Alejandro MullingPinkerton, Debra Entered By: Alejandro MullingPinkerton, Debra on 09/15/2016 14:19:30 SheldonHUGHES, [name] ([id number]) -------------------------------------------------------------------------------- Encounter Discharge Information Details Patient Name: Trevor LackHUGHES, [name] Date of Service: 09/15/2016 10:30 AM Medical Record Number: [id number] Patient Account Number: [id number] Date of Birth/Sex: 11/08/1940 [age] y.o. Male) Treating RN: Phillis HaggisPinkerton, Debi Primary Care Hardeep Reetz: Georgann HousekeeperHUSAIN, KARRAR Other Clinician: Referring Tamrah Victorino: Smiley HousemanMELOY, ALEXANDER Treating Elsie Sakuma/Extender: Rudene ReBritto, Errol Weeks in Treatment: 0 Encounter Discharge Information Items Discharge Pain Level: 0 Discharge Condition: Stable Ambulatory Status: Ambulatory Discharge Destination: Home Transportation: Private Auto Accompanied By: self Schedule Follow-up Appointment: Yes Medication Reconciliation completed and provided to Patient/Care No Verlisa Vara: Provided on Clinical Summary of Care: 09/15/2016 Form Type [facility] Signed: 09/15/2016 12:06:03 PM By: Gwenlyn PerkingMoore, Shelia Entered By: Gwenlyn PerkingMoore, Shelia on 09/15/2016 12:06:03 Rich SquareHUGHES, [name] -------------------------------------------------------------------------------- Lower Extremity Assessment Details Patient Name: Trevor LackHUGHES, [name] Date of Service: 09/15/2016 10:30 AM Medical Record Number: [id number] Patient Account Number: [id number] Date of Birth/Sex: 11/08/1940 [age] y.o. Male) Treating RN: Phillis HaggisPinkerton, Debi Primary Care Eufemia Prindle: Georgann HousekeeperHUSAIN,  KARRAR Other Clinician: Referring Christene Pounds: Smiley HousemanMELOY, ALEXANDER Treating Zhana Jeangilles/Extender: Rudene ReBritto, Errol Weeks in Treatment: 0 Edema Assessment Assessed: [Left: No] [Right: No] E[Left: dema] [Right: :] Calf Left: Right: Point of Measurement: 35 cm From Medial Instep 41.7 cm cm Ankle Left: Right: Point of Measurement: 13 cm From Medial Instep 29 cm cm Vascular Assessment Pulses: Dorsalis Pedis Palpable: [Left:Yes] Doppler Audible: [Left:Yes] Posterior Tibial Extremity colors, hair growth, and conditions: Extremity Color: [Left:Red] Hair Growth on Extremity: [Left:Yes] Temperature of Extremity: [Left:Warm] Capillary Refill: [Left:< 3 seconds] Toe Nail Assessment Left: Right: Thick: No Discolored: No Deformed: No Improper Length and Hygiene: Yes Notes L ABI non-compressible Electronic Signature(s) Signed: 09/15/2016 4:08:27 PM By: Alfonso EllisPinkerton, Debra [name], [name] ([id number]) Entered By: Alejandro MullingPinkerton, Debra on 09/15/2016 11:27:31 [name], [name] ([id number]) -------------------------------------------------------------------------------- Multi Wound Chart Details Patient Name: Trevor LackHUGHES, [name] Date of Service: 09/15/2016 10:30 AM Medical Record Number: [id number] Patient Account Number: [id number] Date of Birth/Sex: 11/08/1940 [age] y.o. Male) Treating RN: Phillis HaggisPinkerton, Debi Primary Care Draxton Luu: Georgann HousekeeperHUSAIN, KARRAR Other Clinician: Referring Mykal Kirchman: Smiley HousemanMELOY, ALEXANDER Treating Demian Maisel/Extender: Rudene ReBritto, Errol Weeks in Treatment: 0 Vital Signs Height(in): 72 Pulse(bpm): 67 Weight(lbs): 236 Blood Pressure 155/77 (mmHg): Body Mass Index(BMI): 32 Temperature(F): 98.4 Respiratory Rate 18 (breaths/min): Photos: [1:No Photos] [N/A:N/A] Wound Location: [1:Left Lower Leg - Lateral] [N/A:N/A] Wounding Event: [1:Trauma] [N/A:N/A] Primary Etiology: [1:Trauma, Other] [N/A:N/A] Comorbid History: [1:Cataracts, Hypertension, Gout] [N/A:N/A] Date Acquired: [1:08/25/2016] [N/A:N/A] Weeks of  Treatment: [1:0] [N/A:N/A] Wound Status: [1:Open] [N/A:N/A] Measurements L x W x D 2.5x1.7x0.1 [N/A:N/A] (cm) Area (cm) : [1:3.338] [N/A:N/A] Volume (cm) : [1:0.334] [N/A:N/A] Classification: [1:Partial Thickness] [N/A:N/A] Exudate Amount: [1:Large] [N/A:N/A] Exudate Type: [1:Serosanguineous] [N/A:N/A] Exudate Color: [1:red, brown] [N/A:N/A] Wound Margin: [1:Distinct, outline attached] [N/A:N/A] Granulation Amount: [1:None Present (0%)] [N/A:N/A] Necrotic Amount: [1:Large (67-100%)] [N/A:N/A] Necrotic Tissue: [1:Eschar, Adherent Slough] [N/A:N/A] Epithelialization: [1:None] [N/A:N/A] Debridement: [1:Debridement (75643(11042- 11047)] [N/A:N/A] Pre-procedure [1:11:44] [N/A:N/A] Verification/Time Out Taken: Pain Control: [1:Lidocaine 4% Topical Solution] [N/A:N/A] Tissue Debrided: [N/A:N/A] Fibrin/Slough, Exudates, Subcutaneous Level: Skin/Subcutaneous N/A N/A Tissue Debridement Area (sq 4.25 N/A N/A cm): Instrument: Forceps, Scissors N/A N/A Bleeding: Large N/A N/A Hemostasis Achieved: Silver Nitrate N/A N/A Procedural Pain: 0 N/A N/A Post Procedural Pain: 0 N/A N/A Debridement Treatment Procedure was tolerated N/A N/A Response: well Post Debridement 2.5x1.7x0.8 N/A N/A Measurements L x W x D (cm) Post Debridement 2.67 N/A N/A Volume: (cm) Periwound Skin Texture: No Abnormalities Noted N/A N/A Periwound Skin No Abnormalities Noted  N/A N/A Moisture: Periwound Skin Color: No Abnormalities Noted N/A N/A Temperature: No Abnormality N/A N/A Tenderness on Yes N/A N/A Palpation: Wound Preparation: Ulcer Cleansing: N/A N/A Rinsed/Irrigated with Saline Topical Anesthetic Applied: Other: lidocaine 4% Procedures Performed: Debridement N/A N/A Treatment Notes Wound #1 (Left, Lateral Lower Leg) 1. Cleansed with: Clean wound with Normal Saline 2. Anesthetic Topical Lidocaine 4% cream to wound bed prior to debridement 4. Dressing Applied: Aquacel Ag 5. Secondary Dressing  Applied ABD Pad Dry Gauze 7. Secured with Tape Notes [place], [name] ([id number]) Roland Rack to anchor, kerlix, coban Electronic Signature(s) Signed: 09/15/2016 12:42:04 PM By: Evlyn Kanner MD, FACS Entered By: Evlyn Kanner on 09/15/2016 12:42:04 [name], [name] ([id number]) -------------------------------------------------------------------------------- Multi-Disciplinary Care Plan Details Patient Name: [name] Date of Service: 09/15/2016 10:30 AM Medical Record Number: [id number] Patient Account Number: [id number] Date of Birth/Sex: Jul 25, 1940 (76 y.o. Male) Treating RN: Phillis Haggis Primary Care Naven Giambalvo: Georgann Housekeeper Other Clinician: Referring Augustin Bun: Smiley Houseman Treating Linkon Siverson/Extender: Rudene Re in Treatment: 0 Active Inactive ` Abuse / Safety / Falls / Self Care Management Nursing Diagnoses: History of Falls Potential for falls Goals: Patient will remain injury free related to falls Date Initiated: 09/15/2016 Target Resolution Date: 11/26/2016 Goal Status: Active Interventions: Assess fall risk on admission and as needed Assess impairment of mobility on admission and as needed per policy Notes: ` Nutrition Nursing Diagnoses: Imbalanced nutrition Potential for alteratiion in Nutrition/Potential for imbalanced nutrition Goals: Patient/caregiver agrees to and verbalizes understanding of need to use nutritional supplements and/or vitamins as prescribed Date Initiated: 09/15/2016 Target Resolution Date: 12/31/2016 Goal Status: Active Interventions: Assess patient nutrition upon admission and as needed per policy Notes: ` Orientation to the Wound Care Program [place], [place] ([id number]) Nursing Diagnoses: Knowledge deficit related to the wound healing center program Goals: Patient/caregiver will verbalize understanding of the Wound Healing Center Program Date Initiated: 09/15/2016 Target Resolution Date: 10/01/2016 Goal Status:  Active Interventions: Provide education on orientation to the wound center Notes: ` Pain, Acute or Chronic Nursing Diagnoses: Pain, acute or chronic: actual or potential Potential alteration in comfort, pain Goals: Patient/caregiver will verbalize adequate pain control between visits Date Initiated: 09/15/2016 Target Resolution Date: 12/31/2016 Goal Status: Active Interventions: Complete pain assessment as per visit requirements Notes: ` Wound/Skin Impairment Nursing Diagnoses: Impaired tissue integrity Knowledge deficit related to ulceration/compromised skin integrity Goals: Ulcer/skin breakdown will have a volume reduction of 80% by week 12 Date Initiated: 09/15/2016 Target Resolution Date: 12/24/2016 Goal Status: Active Interventions: Assess patient/caregiver ability to perform ulcer/skin care regimen upon admission and as needed Notes: [name], [name] ([id number]) Electronic Signature(s) Signed: 09/15/2016 4:08:27 PM By: Alejandro Mulling Entered By: Alejandro Mulling on 09/15/2016 11:40:22 [name], [name] ([id number]) -------------------------------------------------------------------------------- Pain Assessment Details Patient Name: [name] Date of Service: 09/15/2016 10:30 AM Medical Record Number: [id number] Patient Account Number: [id number] Date of Birth/Sex: 24-Nov-1940 (76 y.o. Male) Treating RN: Phillis Haggis Primary Care Trevor Hilligoss: Georgann Housekeeper Other Clinician: Referring Alexandra Lipps: Smiley Houseman Treating Madisyn Mawhinney/Extender: Rudene Re in Treatment: 0 Active Problems Location of Pain Severity and Description of Pain Patient Has Paino Yes Site Locations Pain Location: Pain in Ulcers Rate the pain. Current Pain Level: 5 Character of Pain Describe the Pain: Stabbing, Tender, Throbbing Pain Management and Medication Current Pain Management: Electronic Signature(s) Signed: 09/15/2016 4:08:27 PM By: Alejandro Mulling Entered By: Alejandro Mulling on  09/15/2016 11:11:31 [name], [name] ([id number]) -------------------------------------------------------------------------------- Patient/Caregiver Education Details Patient Name: [name] Date of Service: 09/15/2016 10:30 AM Medical Record Number: [id number] Patient Account Number: [id number] Date of Birth/Gender:  [birth year] (76 y.o. Male) Treating RN: Phillis Haggis Primary Care Physician: Georgann Housekeeper Other Clinician: Referring Physician: Smiley Houseman Treating Physician/Extender: Rudene Re in Treatment: 0 Education Assessment Education Provided To: Patient Education Topics Provided Welcome To The [facility]: Handouts: Welcome To The [facility] Methods: Explain/Verbal Responses: State content correctly Wound/Skin Impairment: Handouts: Other: change dressing as ordered Methods: Demonstration, Explain/Verbal Responses: State content correctly Electronic Signature(s) Signed: 09/15/2016 4:08:27 PM By: Alejandro Mulling Entered By: Alejandro Mulling on 09/15/2016 11:41:30 [place], [name] ([id number]) -------------------------------------------------------------------------------- Wound Assessment Details Patient Name: [name] Date of Service: 09/15/2016 10:30 AM Medical Record Number: [id number] Patient Account Number: [id number] Date of Birth/Sex: 1940/07/17 (76 y.o. Male) Treating RN: Phillis Haggis Primary Care Symphanie Cederberg: Georgann Housekeeper Other Clinician: Referring Holy Battenfield: Smiley Houseman Treating Latysha Thackston/Extender: Rudene Re in Treatment: 0 Wound Status Wound Number: 1 Primary Etiology: Trauma, Other Wound Location: Left Lower Leg - Lateral Wound Status: Open Wounding Event: Trauma Comorbid History: Cataracts, Hypertension, Gout Date Acquired: 08/25/2016 Weeks Of Treatment: 0 Clustered Wound: No Photos Photo Uploaded By: Alejandro Mulling on 09/15/2016 16:02:01 Wound Measurements Length: (cm) 2.5 Width: (cm) 1.7 Depth: (cm)  0.1 Area: (cm) 3.338 Volume: (cm) 0.334 % Reduction in Area: % Reduction in Volume: Epithelialization: None Tunneling: No Undermining: No Wound Description Classification: Partial Thickness Foul Odor Afte Wound Margin: Distinct, outline attached Slough/Fibrino Exudate Amount: Large Exudate Type: Serosanguineous Exudate Color: red, brown r Cleansing: No No Wound Bed Granulation Amount: None Present (0%) Necrotic Amount: Large (67-100%) Necrotic Quality: Eschar, Adherent Slough Periwound Skin Texture [name], [name] ([id number]) Texture Color No Abnormalities Noted: No No Abnormalities Noted: No Moisture Temperature / Pain No Abnormalities Noted: No Temperature: No Abnormality Tenderness on Palpation: Yes Wound Preparation Ulcer Cleansing: Rinsed/Irrigated with Saline Topical Anesthetic Applied: Other: lidocaine 4%, Treatment Notes Wound #1 (Left, Lateral Lower Leg) 1. Cleansed with: Clean wound with Normal Saline 2. Anesthetic Topical Lidocaine 4% cream to wound bed prior to debridement 4. Dressing Applied: Aquacel Ag 5. Secondary Dressing Applied ABD Pad Dry Gauze 7. Secured with Tape Notes unna to anchor, kerlix, Theatre manager) Signed: 09/15/2016 4:08:27 PM By: Alejandro Mulling Entered By: Alejandro Mulling on 09/15/2016 11:30:30 [name], [name] ([id number]) -------------------------------------------------------------------------------- Vitals Details Patient Name: [name] Date of Service: 09/15/2016 10:30 AM Medical Record Number: [id number] Patient Account Number: [id number] Date of Birth/Sex: February 02, 1941 (76 y.o. Male) Treating RN: Phillis Haggis Primary Care Shawnia Vizcarrondo: Georgann Housekeeper Other Clinician: Referring Zayley Arras: Smiley Houseman Treating Yasheka Fossett/Extender: Rudene Re in Treatment: 0 Vital Signs Time Taken: 11:11 Temperature (F): 98.4 Height (in): 72 Pulse (bpm): 67 Source: Stated Respiratory Rate (breaths/min):  18 Weight (lbs): 236 Blood Pressure (mmHg): 155/77 Source: Measured Reference Range: 80 - 120 mg / dl Body Mass Index (BMI): 32 Electronic Signature(s) Signed: 09/15/2016 4:08:27 PM By: Alejandro Mulling Entered By: Alejandro Mulling on 09/15/2016 11:12:01

## 2016-09-20 DIAGNOSIS — L97923 Non-pressure chronic ulcer of unspecified part of left lower leg with necrosis of muscle: Secondary | ICD-10-CM | POA: Diagnosis not present

## 2016-09-20 DIAGNOSIS — M25572 Pain in left ankle and joints of left foot: Secondary | ICD-10-CM | POA: Diagnosis not present

## 2016-09-22 ENCOUNTER — Encounter: Payer: PPO | Admitting: Surgery

## 2016-09-22 DIAGNOSIS — S81802A Unspecified open wound, left lower leg, initial encounter: Secondary | ICD-10-CM | POA: Diagnosis not present

## 2016-09-22 DIAGNOSIS — S81812A Laceration without foreign body, left lower leg, initial encounter: Secondary | ICD-10-CM | POA: Diagnosis not present

## 2016-09-23 NOTE — Progress Notes (Signed)
[name], [name] ([id number]) Visit Report for 09/22/2016 Arrival Information Details Patient Name: [name], [name] Date of Service: 09/22/2016 12:30 PM Medical Record Number: [id number] Patient Account Number: [id number] Date of Birth/Sex: 02/12/1941 [age] y.o. Male) Treating RN: Phillis HaggisPinkerton, Debi Primary Care Damian Hofstra: Georgann HousekeeperHUSAIN, Trevor Other Clinician: Referring Italia Wolfert: Georgann HousekeeperHUSAIN, Trevor Treating Armistead Sult/Extender: Rudene ReBritto, Errol Weeks in Treatment: 1 Visit Information History Since Last Visit All ordered tests and consults were completed: No Patient Arrived: Ambulatory Added or deleted any medications: No Arrival Time: 12:36 Any new allergies or adverse reactions: No Accompanied By: self Had a fall or experienced change in No Transfer Assistance: None activities of daily living that may affect Patient Identification Verified: Yes risk of falls: Secondary Verification Process Yes Signs or symptoms of abuse/neglect since last No Completed: visito Patient Requires Transmission- No Hospitalized since last visit: No Based Precautions: Has Dressing in Place as Prescribed: Yes Patient Has Alerts: Yes Pain Present Now: No Patient Alerts: L ABI non- compressible Electronic Signature(s) Signed: 09/22/2016 4:16:11 PM By: Alejandro MullingPinkerton, Debra Entered By: Alejandro MullingPinkerton, Debra on 09/22/2016 12:37:10 [place] HeightsHUGHES, [name] -------------------------------------------------------------------------------- Clinic Level of Care Assessment Details Patient Name: [name], [name] Date of Service: 09/22/2016 12:30 PM Medical Record Number: [id number] Patient Account Number: [id number] Date of Birth/Sex: 02/12/1941 [age] y.o. Male) Treating RN: Phillis HaggisPinkerton, Debi Primary Care Mahima Hottle: Georgann HousekeeperHUSAIN, Trevor Other Clinician: Referring Kindsey Eblin: Georgann HousekeeperHUSAIN, Trevor Treating Cordell Guercio/Extender: Rudene ReBritto, Errol Weeks in Treatment: 1 Clinic Level of Care Assessment Items TOOL 4 Quantity Score X - Use when only an EandM is performed on  FOLLOW-UP visit 1 0 ASSESSMENTS - Nursing Assessment / Reassessment X - Reassessment of Co-morbidities (includes updates in patient status) 1 10 X - Reassessment of Adherence to Treatment Plan 1 5 ASSESSMENTS - Wound and Skin Assessment / Reassessment X - Simple Wound Assessment / Reassessment - one wound 1 5 []  - Complex Wound Assessment / Reassessment - multiple wounds 0 []  - Dermatologic / Skin Assessment (not related to wound area) 0 ASSESSMENTS - Focused Assessment X - Circumferential Edema Measurements - multi extremities 1 5 []  - Nutritional Assessment / Counseling / Intervention 0 []  - Lower Extremity Assessment (monofilament, tuning fork, pulses) 0 []  - Peripheral Arterial Disease Assessment (using hand held doppler) 0 ASSESSMENTS - Ostomy and/or Continence Assessment and Care []  - Incontinence Assessment and Management 0 []  - Ostomy Care Assessment and Management (repouching, etc.) 0 PROCESS - Coordination of Care []  - Simple Patient / Family Education for ongoing care 0 X - Complex (extensive) Patient / Family Education for ongoing care 1 20 X - Staff obtains ChiropractorConsents, Records, Test Results / Process Orders 1 10 X - Staff telephones HHA, Nursing Homes / Clarify orders / etc 1 10 []  - Routine Transfer to another Facility (non-emergent condition) 0 [name], [name] ([id number]) []  - Routine Hospital Admission (non-emergent condition) 0 []  - New Admissions / Manufacturing engineernsurance Authorizations / Ordering NPWT, Apligraf, etc. 0 []  - Emergency Hospital Admission (emergent condition) 0 X - Simple Discharge Coordination 1 10 []  - Complex (extensive) Discharge Coordination 0 PROCESS - Special Needs []  - Pediatric / Minor Patient Management 0 []  - Isolation Patient Management 0 []  - Hearing / Language / Visual special needs 0 []  - Assessment of Community assistance (transportation, D/C planning, etc.) 0 []  - Additional assistance / Altered mentation 0 []  - Support Surface(s) Assessment (bed,  cushion, seat, etc.) 0 INTERVENTIONS - Wound Cleansing / Measurement X - Simple Wound Cleansing - one wound 1 5 []  - Complex Wound Cleansing - multiple wounds 0 X -  Wound Imaging (photographs - any number of wounds) 1 5 []  - Wound Tracing (instead of photographs) 0 X - Simple Wound Measurement - one wound 1 5 []  - Complex Wound Measurement - multiple wounds 0 INTERVENTIONS - Wound Dressings []  - Small Wound Dressing one or multiple wounds 0 []  - Medium Wound Dressing one or multiple wounds 0 X - Large Wound Dressing one or multiple wounds 1 20 X - Application of Medications - topical 1 5 []  - Application of Medications - injection 0 INTERVENTIONS - Miscellaneous []  - External ear exam 0 [name], [name] ([id number]) []  - Specimen Collection (cultures, biopsies, blood, body fluids, etc.) 0 []  - Specimen(s) / Culture(s) sent or taken to Lab for analysis 0 []  - Patient Transfer (multiple staff / Michiel Sites Lift / Similar devices) 0 []  - Simple Staple / Suture removal (25 or less) 0 []  - Complex Staple / Suture removal (26 or more) 0 []  - Hypo / Hyperglycemic Management (close monitor of Blood Glucose) 0 []  - Ankle / Brachial Index (ABI) - do not check if billed separately 0 X - Vital Signs 1 5 Has the patient been seen at the hospital within the last three years: Yes Total Score: 120 Level Of Care: New/Established - Level 4 Electronic Signature(s) Signed: 09/22/2016 4:16:11 PM By: Alejandro Mulling Entered By: Alejandro Mulling on 09/22/2016 13:17:37 [name], [name] ([id number]) -------------------------------------------------------------------------------- Encounter Discharge Information Details Patient Name: [name] Date of Service: 09/22/2016 12:30 PM Medical Record Number: [id number] Patient Account Number: [id number] Date of Birth/Sex: Mar 17, 1941 (76 y.o. Male) Treating RN: Phillis Haggis Primary Care Monroe Toure: Georgann Housekeeper Other Clinician: Referring Yassin Scales: Georgann Housekeeper Treating  Lavelle Akel/Extender: Rudene Re in Treatment: 1 Encounter Discharge Information Items Discharge Pain Level: 0 Discharge Condition: Stable Ambulatory Status: Ambulatory Discharge Destination: Home Transportation: Private Auto Accompanied By: self Schedule Follow-up Appointment: Yes Medication Reconciliation completed and provided to Patient/Care No Om [place]: Provided on Clinical Summary of Care: 09/22/2016 Form Type Recipient Paper Patient [facility] Electronic Signature(s) Signed: 09/22/2016 1:03:48 PM By: Gwenlyn Perking Entered By: Gwenlyn Perking on 09/22/2016 13:03:48 [name], [name] ([id number]) -------------------------------------------------------------------------------- Lower Extremity Assessment Details Patient Name: [name] Date of Service: 09/22/2016 12:30 PM Medical Record Number: [id number] Patient Account Number: [id number] Date of Birth/Sex: 1940/06/11 (76 y.o. Male) Treating RN: Phillis Haggis Primary Care Estes Lehner: Georgann Housekeeper Other Clinician: Referring Mariaclara Spear: Georgann Housekeeper Treating Mylez Venable/Extender: Rudene Re in Treatment: 1 Edema Assessment Assessed: [Left: No] [Right: No] E[Left: dema] [Right: :] Calf Left: Right: Point of Measurement: 35 cm From Medial Instep 40.5 cm cm Ankle Left: Right: Point of Measurement: 13 cm From Medial Instep 27 cm cm Vascular Assessment Pulses: Dorsalis Pedis Palpable: [Left:Yes] Posterior Tibial Extremity colors, hair growth, and conditions: Extremity Color: [Left:Red] Temperature of Extremity: [Left:Warm] Capillary Refill: [Left:< 3 seconds] Electronic Signature(s) Signed: 09/22/2016 4:16:11 PM By: Alejandro Mulling Entered By: Alejandro Mulling on 09/22/2016 12:46:46 [name], [name] ([id number]) -------------------------------------------------------------------------------- Multi Wound Chart Details Patient Name: [name] Date of Service: 09/22/2016 12:30 PM Medical Record Number:  [id number] Patient Account Number: [id number] Date of Birth/Sex: Aug 17, 1940 (77 y.o. Male) Treating RN: Phillis Haggis Primary Care Andrienne Havener: Georgann Housekeeper Other Clinician: Referring Desare Duddy: Georgann Housekeeper Treating Deloyd Handy/Extender: Rudene Re in Treatment: 1 Vital Signs Height(in): 72 Pulse(bpm): 61 Weight(lbs): 236 Blood Pressure 134/61 (mmHg): Body Mass Index(BMI): 32 Temperature(F): 97.9 Respiratory Rate 18 (breaths/min): Photos: [1:No Photos] [N/A:N/A] Wound Location: [1:Left Lower Leg - Lateral] [N/A:N/A] Wounding Event: [1:Trauma] [N/A:N/A] Primary Etiology: [1:Trauma, Other] [N/A:N/A] Comorbid History: [1:Cataracts, Hypertension, Gout] [N/A:N/A] Date  Acquired: [1:08/25/2016] [N/A:N/A] Weeks of Treatment: [1:1] [N/A:N/A] Wound Status: [1:Open] [N/A:N/A] Measurements L x W x D 2.6x1.8x0.8 [N/A:N/A] (cm) Area (cm) : [1:3.676] [N/A:N/A] Volume (cm) : [1:2.941] [N/A:N/A] % Reduction in Area: [1:-10.10%] [N/A:N/A] % Reduction in Volume: -780.50% [N/A:N/A] Starting Position 1 11 (o'clock): Ending Position 1 [1:1] (o'clock): Maximum Distance 1 1.5 (cm): Undermining: [1:Yes] [N/A:N/A] Classification: [1:Partial Thickness] [N/A:N/A] Exudate Amount: [1:Large] [N/A:N/A] Exudate Type: [1:Serosanguineous] [N/A:N/A] Exudate Color: [1:red, brown] [N/A:N/A] Wound Margin: [1:Distinct, outline attached] [N/A:N/A] Granulation Amount: [1:Large (67-100%)] [N/A:N/A] Necrotic Amount: [1:Small (1-33%)] [N/A:N/A] Epithelialization: None N/A N/A Periwound Skin Texture: No Abnormalities Noted N/A N/A Periwound Skin No Abnormalities Noted N/A N/A Moisture: Periwound Skin Color: No Abnormalities Noted N/A N/A Temperature: No Abnormality N/A N/A Tenderness on Yes N/A N/A Palpation: Wound Preparation: Ulcer Cleansing: N/A N/A Rinsed/Irrigated with Saline Topical Anesthetic Applied: Other: lidocaine 4% Treatment Notes Wound #1 (Left, Lateral Lower Leg) 1.  Cleansed with: Clean wound with Normal Saline 2. Anesthetic Topical Lidocaine 4% cream to wound bed prior to debridement 3. Peri-wound Care: Moisturizing lotion 4. Dressing Applied: Aquacel Ag 5. Secondary Dressing Applied ABD Pad Dry Gauze 7. Secured with Tape Notes unna to anchor, kerlix, Theatre manager) Signed: 09/22/2016 1:11:11 PM By: Evlyn Kanner MD, FACS Entered By: Evlyn Kanner on 09/22/2016 13:11:11 [name], [name] ([id number]) -------------------------------------------------------------------------------- Multi-Disciplinary Care Plan Details Patient Name: [name] Date of Service: 09/22/2016 12:30 PM Medical Record Number: [id number] Patient Account Number: [id number] Date of Birth/Sex: May 18, 1940 (76 y.o. Male) Treating RN: Phillis Haggis Primary Care Eleni Frank: Georgann Housekeeper Other Clinician: Referring Sly Parlee: Georgann Housekeeper Treating Deana Krock/Extender: Rudene Re in Treatment: 1 Active Inactive ` Abuse / Safety / Falls / Self Care Management Nursing Diagnoses: History of Falls Potential for falls Goals: Patient will remain injury free related to falls Date Initiated: 09/15/2016 Target Resolution Date: 11/26/2016 Goal Status: Active Interventions: Assess fall risk on admission and as needed Assess impairment of mobility on admission and as needed per policy Notes: ` Nutrition Nursing Diagnoses: Imbalanced nutrition Potential for alteratiion in Nutrition/Potential for imbalanced nutrition Goals: Patient/caregiver agrees to and verbalizes understanding of need to use nutritional supplements and/or vitamins as prescribed Date Initiated: 09/15/2016 Target Resolution Date: 12/31/2016 Goal Status: Active Interventions: Assess patient nutrition upon admission and as needed per policy Notes: ` Orientation to the Wound Care Program [place], [place] ([id number]) Nursing Diagnoses: Knowledge deficit related to the wound healing center  program Goals: Patient/caregiver will verbalize understanding of the Wound Healing Center Program Date Initiated: 09/15/2016 Target Resolution Date: 10/01/2016 Goal Status: Active Interventions: Provide education on orientation to the wound center Notes: ` Pain, Acute or Chronic Nursing Diagnoses: Pain, acute or chronic: actual or potential Potential alteration in comfort, pain Goals: Patient/caregiver will verbalize adequate pain control between visits Date Initiated: 09/15/2016 Target Resolution Date: 12/31/2016 Goal Status: Active Interventions: Complete pain assessment as per visit requirements Notes: ` Wound/Skin Impairment Nursing Diagnoses: Impaired tissue integrity Knowledge deficit related to ulceration/compromised skin integrity Goals: Ulcer/skin breakdown will have a volume reduction of 80% by week 12 Date Initiated: 09/15/2016 Target Resolution Date: 12/24/2016 Goal Status: Active Interventions: Assess patient/caregiver ability to perform ulcer/skin care regimen upon admission and as needed Notes: [name], [name] ([id number]) Electronic Signature(s) Signed: 09/22/2016 4:16:11 PM By: Alejandro Mulling Entered By: Alejandro Mulling on 09/22/2016 12:48:38 [place], [name] ([id number]) -------------------------------------------------------------------------------- Pain Assessment Details Patient Name: [name] Date of Service: 09/22/2016 12:30 PM Medical Record Number: [id number] Patient Account Number: [id number] Date of Birth/Sex: [birth year] (76 y.o. Male) Treating RN: Ashok Cordia, Sinda Du  Primary Care Cay Kath: Georgann Housekeeper Other Clinician: Referring Renata Gambino: Georgann Housekeeper Treating Antaniya Venuti/Extender: Rudene Re in Treatment: 1 Active Problems Location of Pain Severity and Description of Pain Patient Has Paino No Site Locations With Dressing Change: No Pain Management and Medication Current Pain Management: Electronic Signature(s) Signed: 09/22/2016 4:16:11  PM By: Alejandro Mulling Entered By: Alejandro Mulling on 09/22/2016 12:37:17 [name], [name] ([id number]) -------------------------------------------------------------------------------- Patient/Caregiver Education Details Patient Name: [name] Date of Service: 09/22/2016 12:30 PM Medical Record Number: [id number] Patient Account Number: [id number] Date of Birth/Gender: July 01, 1940 (76 y.o. Male) Treating RN: Phillis Haggis Primary Care Physician: Georgann Housekeeper Other Clinician: Referring Physician: Georgann Housekeeper Treating Physician/Extender: Rudene Re in Treatment: 1 Education Assessment Education Provided To: Patient Education Topics Provided Wound/Skin Impairment: Handouts: Other: change dressing as ordered Methods: Demonstration, Explain/Verbal Responses: State content correctly Electronic Signature(s) Signed: 09/22/2016 4:16:11 PM By: Alejandro Mulling Entered By: Alejandro Mulling on 09/22/2016 12:55:28 [name], [name] ([id number]) -------------------------------------------------------------------------------- Wound Assessment Details Patient Name: [name] Date of Service: 09/22/2016 12:30 PM Medical Record Number: [id number] Patient Account Number: [id number] Date of Birth/Sex: 1940/11/18 (76 y.o. Male) Treating RN: Phillis Haggis Primary Care Shaana Acocella: Georgann Housekeeper Other Clinician: Referring Anie Juniel: Georgann Housekeeper Treating Jericha Bryden/Extender: Rudene Re in Treatment: 1 Wound Status Wound Number: 1 Primary Etiology: Trauma, Other Wound Location: Left Lower Leg - Lateral Wound Status: Open Wounding Event: Trauma Comorbid History: Cataracts, Hypertension, Gout Date Acquired: 08/25/2016 Weeks Of Treatment: 1 Clustered Wound: No Photos Photo Uploaded By: Alejandro Mulling on 09/22/2016 15:03:28 Wound Measurements Length: (cm) 2.6 Width: (cm) 1.8 Depth: (cm) 0.8 Area: (cm) 3.676 Volume: (cm) 2.941 % Reduction in Area: -10.1% % Reduction in  Volume: -780.5% Epithelialization: None Tunneling: No Undermining: Yes Starting Position (o'clock): 11 Ending Position (o'clock): 1 Maximum Distance: (cm) 1.5 Wound Description Classification: Partial Thickness Foul Odor Afte Wound Margin: Distinct, outline attached Slough/Fibrino Exudate Amount: Large Exudate Type: Serosanguineous Exudate Color: red, brown r Cleansing: No No Wound Bed Piechowski, [name] ([id number]) Granulation Amount: Large (67-100%) Necrotic Amount: Small (1-33%) Necrotic Quality: Adherent Slough Periwound Skin Texture Texture Color No Abnormalities Noted: No No Abnormalities Noted: No Moisture Temperature / Pain No Abnormalities Noted: No Temperature: No Abnormality Tenderness on Palpation: Yes Wound Preparation Ulcer Cleansing: Rinsed/Irrigated with Saline Topical Anesthetic Applied: Other: lidocaine 4%, Treatment Notes Wound #1 (Left, Lateral Lower Leg) 1. Cleansed with: Clean wound with Normal Saline 2. Anesthetic Topical Lidocaine 4% cream to wound bed prior to debridement 3. Peri-wound Care: Moisturizing lotion 4. Dressing Applied: Aquacel Ag 5. Secondary Dressing Applied ABD Pad Dry Gauze 7. Secured with Tape Notes unna to anchor, kerlix, coban Electronic Signature(s) Signed: 09/22/2016 4:16:11 PM By: Alejandro Mulling Entered By: Alejandro Mulling on 09/22/2016 12:44:39 [name], [name] ([id number]) -------------------------------------------------------------------------------- Vitals Details Patient Name: [name] Date of Service: 09/22/2016 12:30 PM Medical Record Number: [id number] Patient Account Number: [id number] Date of Birth/Sex: 1941/04/16 (76 y.o. Male) Treating RN: Phillis Haggis Primary Care Areli Jowett: Georgann Housekeeper Other Clinician: Referring Tarique Loveall: Georgann Housekeeper Treating Antoniette Peake/Extender: Rudene Re in Treatment: 1 Vital Signs Time Taken: 12:37 Temperature (F): 97.9 Height (in): 72 Pulse (bpm):  61 Weight (lbs): 236 Respiratory Rate (breaths/min): 18 Body Mass Index (BMI): 32 Blood Pressure (mmHg): 134/61 Reference Range: 80 - 120 mg / dl Electronic Signature(s) Signed: 09/22/2016 4:16:11 PM By: Alejandro Mulling Entered By: Alejandro Mulling on 09/22/2016 12:39:07

## 2016-09-24 NOTE — Progress Notes (Signed)
CORRIE, BRANNEN ([id number]) Visit Report for 09/22/2016 Chief Complaint Document Details Patient Name: LJ, MIYAMOTO Date of Service: 09/22/2016 12:30 PM Medical Record Number: [id number] Patient Account Number: [id number] Date of Birth/Sex: 08/22/1940 (76 y.o. Male) Treating RN: Phillis Haggis Primary Care Provider: Georgann Housekeeper Other Clinician: Referring Provider: Georgann Housekeeper Treating Provider/Extender: Rudene Re in Treatment: 1 Information Obtained from: Patient Chief Complaint Patient seen for complaints of Non-Healing Wound left lower lateral leg for 3 weeks Electronic Signature(s) Signed: 09/22/2016 1:11:23 PM By: Evlyn Kanner MD, FACS Entered By: Evlyn Kanner on 09/22/2016 13:11:23 [place], [name] ([id number]) -------------------------------------------------------------------------------- HPI Details Patient Name: [name] Date of Service: 09/22/2016 12:30 PM Medical Record Number: [id number] Patient Account Number: [id number] Date of Birth/Sex: 16-Aug-1940 (76 y.o. Male) Treating RN: Phillis Haggis Primary Care Provider: Georgann Housekeeper Other Clinician: Referring Provider: Georgann Housekeeper Treating Provider/Extender: Rudene Re in Treatment: 1 History of Present Illness Location: left lower lateral leg Quality: Patient reports experiencing a sharp pain to affected area(s). Severity: Patient states wound are getting worse. Duration: Patient has had the wound for < 4 weeks prior to presenting for treatment Timing: Pain in wound is Intermittent (comes and goes Context: The wound occurred when the patient tripped and had a fall and injured his left leg Modifying Factors: Other treatment(s) tried include:had gone to an urgent care couple of times a day taken an x-ray and was put on antibiotics Associated Signs and Symptoms: Patient reports having increase swelling and redness to the leg HPI Description: 76 year old gentleman who had a injury to the left ankle  and foot 3 weeks ago was seen recently in the ER after he had swelling and redness of the left ankle. He is also known to have a flareup of gout recently. After he was examined in the urgent care he was placed on Keflex and Bactrim and referred to the wound center. A recent x-ray of the left ankle did not show any fractures or abnormality. The patient doesn't take very good care of his health has never been worked up for chronic lymphedema which she's had and sees a physician about once a year. He has not been aware that he has bilateral lower extremity lymphedema. he does not smoke. He has never had a workup for his lymphedema Electronic Signature(s) Signed: 09/22/2016 1:11:28 PM By: Evlyn Kanner MD, FACS Entered By: Evlyn Kanner on 09/22/2016 13:11:28 BRANDAN, ROBICHEAUX ([id number]) -------------------------------------------------------------------------------- Physical Exam Details Patient Name: [name] Date of Service: 09/22/2016 12:30 PM Medical Record Number: [id number] Patient Account Number: [id number] Date of Birth/Sex: [birth year] (76 y.o. Male) Treating RN: Phillis Haggis Primary Care Provider: Georgann Housekeeper Other Clinician: Referring Provider: Georgann Housekeeper Treating Provider/Extender: Rudene Re in Treatment: 1 Constitutional . Pulse regular. Respirations normal and unlabored. Afebrile. . Eyes Nonicteric. Reactive to light. Ears, Nose, Mouth, and Throat Lips, teeth, and gums WNL.Marland Kitchen Moist mucosa without lesions. Neck supple and nontender. No palpable supraclavicular or cervical adenopathy. Normal sized without goiter. Respiratory WNL. No retractions.. Cardiovascular Pedal Pulses WNL. No clubbing, cyanosis or edema. Lymphatic No adneopathy. No adenopathy. No adenopathy. Musculoskeletal Adexa without tenderness or enlargement.. Digits and nails w/o clubbing, cyanosis, infection, petechiae, ischemia, or inflammatory conditions.. Integumentary (Hair, Skin) No  suspicious lesions. No crepitus or fluctuance. No peri-wound warmth or erythema. No masses.Marland Kitchen Psychiatric Judgement and insight Intact.. No evidence of depression, anxiety, or agitation.. Notes the wound has no active bleeding and there is healthy edges and base. The undermining continues to be about 2 cm at the 12:00 position.  No sharp debridement was required today. Electronic Signature(s) Signed: 09/22/2016 1:12:16 PM By: Evlyn Kanner MD, FACS Entered By: Evlyn Kanner on 09/22/2016 13:12:15 [place], Kathlene November ([id number]) -------------------------------------------------------------------------------- Physician Orders Details Patient Name: [name] Date of Service: 09/22/2016 12:30 PM Medical Record Number: [id number] Patient Account Number: [id number] Date of Birth/Sex: 02-20-1941 (76 y.o. Male) Treating RN: Phillis Haggis Primary Care Provider: Georgann Housekeeper Other Clinician: Referring Provider: Georgann Housekeeper Treating Provider/Extender: Rudene Re in Treatment: 1 Verbal / Phone Orders: Yes Clinician: Pinkerton, Debi Read Back and Verified: Yes Diagnosis Coding Wound Cleansing Wound #1 Left,Lateral Lower Leg o Clean wound with Normal Saline. o Cleanse wound with mild soap and water Anesthetic Wound #1 Left,Lateral Lower Leg o Topical Lidocaine 4% cream applied to wound bed prior to debridement - for clinic use Skin Barriers/Peri-Wound Care Wound #1 Left,Lateral Lower Leg o Moisturizing lotion - not on wound Primary Wound Dressing Wound #1 Left,Lateral Lower Leg o Aquacel Ag - rope, pack into undermining lightly Secondary Dressing Wound #1 Left,Lateral Lower Leg o ABD pad o Dry Gauze Dressing Change Frequency Wound #1 Left,Lateral Lower Leg o Three times weekly Follow-up Appointments Wound #1 Left,Lateral Lower Leg o Return Appointment in 1 week. Edema Control Wound #1 Left,Lateral Lower Leg o Kerlix and Coban - Left Lower Extremity -  3cm from toes to 3 cm from knee unna to anchor o Elevate legs to the level of the heart and pump ankles as often as possible Liera, [name] ([id number]) Additional Orders / Instructions o Increase protein intake. o Other: - Make an appointment to see your primary care physician. Home Health Wound #1 Left,Lateral Lower Leg o Initiate Home Health for Skilled Nursing o Home Health Nurse may visit PRN to address patientos wound care needs. o FACE TO FACE ENCOUNTER: MEDICARE and MEDICAID PATIENTS: I certify that this patient is under my care and that I had a face-to-face encounter that meets the physician face-to-face encounter requirements with this patient on this date. The encounter with the patient was in whole or in part for the following MEDICAL CONDITION: (primary reason for Home Healthcare) MEDICAL NECESSITY: I certify, that based on my findings, NURSING services are a medically necessary home health service. HOME BOUND STATUS: I certify that my clinical findings support that this patient is homebound (i.e., Due to illness or injury, pt requires aid of supportive devices such as crutches, cane, wheelchairs, walkers, the use of special transportation or the assistance of another person to leave their place of residence. There is a normal inability to leave the home and doing so requires considerable and taxing effort. Other absences are for medical reasons / religious services and are infrequent or of short duration when for other reasons). o If current dressing causes regression in wound condition, may D/C ordered dressing product/s and apply Normal Saline Moist Dressing daily until next Wound Healing Center / Other MD appointment. Notify Wound Healing Center of regression in wound condition at [phone]. o Please direct any NON-WOUND related issues/requests for orders to patient's Primary Care Physician Medications-please add to medication list. Wound #1 Left,Lateral  Lower Leg o P.O. Antibiotics - Continue taking your oral antibiotics as prescribed. Electronic Signature(s) Signed: 09/22/2016 4:16:11 PM By: Alejandro Mulling Signed: 09/22/2016 4:34:04 PM By: Evlyn Kanner MD, FACS Entered By: Alejandro Mulling on 09/22/2016 12:54:49 [place], [name] ([id number]) -------------------------------------------------------------------------------- Problem List Details Patient Name: [name] Date of Service: 09/22/2016 12:30 PM Medical Record Number: [id number] Patient Account Number: [id number] Date of Birth/Sex: Sep 03, 1940 (76 y.o. 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 Male) Treating RN: Phillis Haggis Primary Care Provider: Georgann Housekeeper Other Clinician: Referring Provider: Georgann Housekeeper Treating Provider/Extender: Rudene Re in Treatment: 1 Subjective Chief  Complaint Information obtained from Patient Patient seen for complaints of Non-Healing Wound left lower lateral leg for 3 weeks History of Present Illness (HPI) The following HPI elements were documented for the patient's wound: Location: left lower lateral leg Quality: Patient reports experiencing a sharp pain to affected area(s). Severity: Patient states wound are getting worse. Duration: Patient has had the wound for < 4 weeks prior to presenting for treatment Timing: Pain in wound is Intermittent (comes and goes Context: The wound occurred when the patient tripped and had a fall and injured his left leg Modifying Factors: Other treatment(s) tried include:had gone to an urgent care couple of times a day taken an x-ray and was put on antibiotics Associated Signs and Symptoms: Patient reports having increase swelling and redness to the leg 76 year old gentleman who had a injury to the left ankle and foot 3 weeks ago was seen recently in the ER after he had swelling and redness of the left ankle. He is also known to have a flareup of gout recently. After he was examined in the urgent care he was placed on Keflex and Bactrim and referred to the wound center. A recent x-ray of the left ankle did not show any fractures or abnormality. The patient doesn't take very good care of his health has never been worked up for chronic lymphedema which she's had and sees a physician about once a year. He has not been aware that he has bilateral lower extremity lymphedema. he does not smoke. He has never had a workup for his lymphedema Objective Constitutional Pulse regular. Respirations normal and unlabored. Afebrile. Vitals Time Taken: 12:37 PM, Height: 72 in, Weight: 236 lbs, BMI: 32, Temperature: 97.9 F, Pulse: 61 bpm, Respiratory Rate: 18 breaths/min, Blood Pressure: 134/61 mmHg. [place], [name] ([id number]) Eyes Nonicteric. Reactive to light. Ears, Nose, Mouth, and Throat Lips, teeth, and gums  WNL.Marland Kitchen Moist mucosa without lesions. Neck supple and nontender. No palpable supraclavicular or cervical adenopathy. Normal sized without goiter. Respiratory WNL. No retractions.. Cardiovascular Pedal Pulses WNL. No clubbing, cyanosis or edema. Lymphatic No adneopathy. No adenopathy. No adenopathy. Musculoskeletal Adexa without tenderness or enlargement.. Digits and nails w/o clubbing, cyanosis, infection, petechiae, ischemia, or inflammatory conditions.Marland Kitchen Psychiatric Judgement and insight Intact.. No evidence of depression, anxiety, or agitation.. General Notes: the wound has no active bleeding and there is healthy edges and base. The undermining continues to be about 2 cm at the 12:00 position. No sharp debridement was required today. Integumentary (Hair, Skin) No suspicious lesions. No crepitus or fluctuance. No  peri-wound warmth or erythema. No masses.. Wound #1 status is Open. Original cause of wound was Trauma. The wound is located on the Left,Lateral Lower Leg. The wound measures 2.6cm length x 1.8cm width x 0.8cm depth; 3.676cm^2 area and 2.941cm^3 volume. There is no tunneling noted, however, there is undermining starting at 11:00 and ending at 1:00 with a maximum distance of 1.5cm. There is a large amount of serosanguineous drainage noted. The wound margin is distinct with the outline attached to the wound base. There is large (67-100%) granulation within the wound bed. There is a small (1-33%) amount of necrotic tissue within the wound bed including Adherent Slough. Periwound temperature was noted as No Abnormality. The periwound has tenderness on palpation. 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 Cleanse wound with mild soap and water Anesthetic: Wound #1 Left,Lateral Lower Leg: Topical Lidocaine 4% cream applied to wound bed prior to debridement - for clinic use Skin Barriers/Peri-Wound Care: Wound #1 Left,Lateral Lower Leg: Moisturizing lotion - not on wound Primary Wound Dressing: Wound #1 Left,Lateral Lower Leg: Aquacel Ag - rope, pack into undermining lightly Secondary Dressing: Wound #1 Left,Lateral Lower Leg: ABD pad Dry Gauze Dressing Change Frequency: Wound #1 Left,Lateral Lower Leg: Three times weekly Follow-up Appointments: Wound #1 Left,Lateral Lower Leg: Return Appointment in 1 week. Edema Control: Wound #1 Left,Lateral Lower Leg: Kerlix and Coban - Left Lower Extremity - 3cm from toes to 3 cm from knee unna to anchor Elevate legs to the level of the heart and pump ankles as often as possible Additional Orders / Instructions: Increase protein intake. Other: - Make an appointment to see your primary care physician. Home Health: Wound #1 Left,Lateral Lower Leg: Initiate Home Health for Skilled Nursing Home Health Nurse may visit PRN to address patient s wound care needs. FACE TO FACE ENCOUNTER: MEDICARE and MEDICAID PATIENTS: I certify that this patient is under my care and that I had a face-to-face encounter that meets the physician face-to-face encounter NASSIR, NEIDERT ([id number]) requirements with this patient on this date. The encounter with the patient was in whole or in part for the following MEDICAL CONDITION: (primary reason for Home Healthcare) MEDICAL NECESSITY: I certify, that based on my findings, NURSING services are a medically necessary home health service. HOME BOUND STATUS: I certify that my clinical findings support that this patient is homebound (i.e., Due to illness or injury, pt requires  aid of supportive devices such as crutches, cane, wheelchairs, walkers, the use of special transportation or the assistance of another person to leave their place of residence. There is a normal inability to leave the home and doing so requires considerable and taxing effort. Other absences are for medical reasons / religious services and are infrequent or of short duration when for other reasons). If current dressing causes regression in wound condition, may D/C ordered dressing product/s and apply Normal Saline Moist Dressing daily until next Wound Healing Center / Other MD appointment. Notify Wound Healing Center of regression in wound condition at [phone]. Please direct any NON-WOUND related issues/requests for orders to patient's Primary Care Physician Medications-please add to medication list.: Wound #1  Left,Lateral Lower Leg: P.O. Antibiotics - Continue taking your oral antibiotics as prescribed. There is good resolution of his edema and his cellulitis and after reviewing the wound today I have recommended: 1. Packing the wound with silver alginate rope and applying a light Kerlix and Coban dressing 2. Elevation and exercise. 3. Complete his course of antibiotics 4. see his PCP as soon as possible as he is complaining of dyspnea and weakness 5. Regular visits to the wound center I have answered all his questions and he will be compliant Electronic Signature(s) Signed: 09/22/2016 1:13:29 PM By: Evlyn KannerBritto, Latoi Giraldo MD, FACS Entered By: Evlyn KannerBritto, Tanyiah Laurich on 09/22/2016 13:13:29 BancroftHUGHES, [name] ([id number]) -------------------------------------------------------------------------------- SuperBill Details Patient Name: Damita LackHUGHES, [name] Date of Service: 09/22/2016 Medical Record Number: [id number] Patient Account Number: [id number] Date of Birth/Sex: 05/04/1940 [age] y.o. Male) Treating RN: Phillis HaggisPinkerton, Debi Primary Care Provider: Georgann HousekeeperHUSAIN, KARRAR Other Clinician: Referring Provider: Georgann HousekeeperHUSAIN,  KARRAR Treating Provider/Extender: Rudene ReBritto, Ardie Mclennan Weeks in Treatment: 1 Diagnosis Coding ICD-10 Codes Code Description [id number] Laceration without foreign body, left lower leg, initial encounter [phone] Other soft tissue disorders related to use, overuse and pressure, left lower leg I89.0 Lymphedema, not elsewhere classified L03.116 Cellulitis of left lower limb L97.322 Non-pressure chronic ulcer of left ankle with fat layer exposed Facility Procedures CPT4 Code: [id number] Description: 99214 - WOUND CARE VISIT-LEV 4 EST PT Modifier: Quantity: 1 Physician Procedures CPT4: Description Modifier Quantity Code [id number] 99213 - WC PHYS LEVEL 3 - EST PT 1 ICD-10 Description Diagnosis S81.812A Laceration without foreign body, left lower leg, initial encounter M70.862 Other soft tissue disorders related to use, overuse and  pressure, left lower leg I89.0 Lymphedema, not elsewhere classified Electronic Signature(s) Signed: 09/22/2016 4:16:11 PM By: Alejandro MullingPinkerton, Debra Signed: 09/22/2016 4:34:04 PM By: Evlyn KannerBritto, Zaydenn Balaguer MD, FACS Previous Signature: 09/22/2016 1:13:45 PM Version By: Evlyn KannerBritto, Devonne Lalani MD, FACS Entered By: Alejandro MullingPinkerton, Debra on 09/22/2016 13:17:44

## 2016-09-26 DIAGNOSIS — Z791 Long term (current) use of non-steroidal anti-inflammatories (NSAID): Secondary | ICD-10-CM | POA: Diagnosis not present

## 2016-09-26 DIAGNOSIS — Z87891 Personal history of nicotine dependence: Secondary | ICD-10-CM | POA: Diagnosis not present

## 2016-09-26 DIAGNOSIS — M109 Gout, unspecified: Secondary | ICD-10-CM | POA: Diagnosis not present

## 2016-09-26 DIAGNOSIS — I890 Lymphedema, not elsewhere classified: Secondary | ICD-10-CM | POA: Diagnosis not present

## 2016-09-26 DIAGNOSIS — E6609 Other obesity due to excess calories: Secondary | ICD-10-CM | POA: Diagnosis not present

## 2016-09-26 DIAGNOSIS — L03116 Cellulitis of left lower limb: Secondary | ICD-10-CM | POA: Diagnosis not present

## 2016-09-26 DIAGNOSIS — Z6832 Body mass index (BMI) 32.0-32.9, adult: Secondary | ICD-10-CM | POA: Diagnosis not present

## 2016-09-26 DIAGNOSIS — I10 Essential (primary) hypertension: Secondary | ICD-10-CM | POA: Diagnosis not present

## 2016-09-26 DIAGNOSIS — S81812D Laceration without foreign body, left lower leg, subsequent encounter: Secondary | ICD-10-CM | POA: Diagnosis not present

## 2016-09-28 DIAGNOSIS — L03116 Cellulitis of left lower limb: Secondary | ICD-10-CM | POA: Diagnosis not present

## 2016-09-28 DIAGNOSIS — S81812D Laceration without foreign body, left lower leg, subsequent encounter: Secondary | ICD-10-CM | POA: Diagnosis not present

## 2016-09-28 DIAGNOSIS — I890 Lymphedema, not elsewhere classified: Secondary | ICD-10-CM | POA: Diagnosis not present

## 2016-09-29 ENCOUNTER — Encounter: Payer: PPO | Attending: Surgery | Admitting: Surgery

## 2016-09-29 DIAGNOSIS — I890 Lymphedema, not elsewhere classified: Secondary | ICD-10-CM | POA: Diagnosis not present

## 2016-09-29 DIAGNOSIS — S81812A Laceration without foreign body, left lower leg, initial encounter: Secondary | ICD-10-CM | POA: Insufficient documentation

## 2016-09-29 DIAGNOSIS — M109 Gout, unspecified: Secondary | ICD-10-CM | POA: Diagnosis not present

## 2016-09-29 DIAGNOSIS — M70862 Other soft tissue disorders related to use, overuse and pressure, left lower leg: Secondary | ICD-10-CM | POA: Diagnosis not present

## 2016-09-29 DIAGNOSIS — L03116 Cellulitis of left lower limb: Secondary | ICD-10-CM | POA: Insufficient documentation

## 2016-09-29 DIAGNOSIS — W010XXA Fall on same level from slipping, tripping and stumbling without subsequent striking against object, initial encounter: Secondary | ICD-10-CM | POA: Insufficient documentation

## 2016-09-29 DIAGNOSIS — L97322 Non-pressure chronic ulcer of left ankle with fat layer exposed: Secondary | ICD-10-CM | POA: Insufficient documentation

## 2016-09-29 DIAGNOSIS — S81802A Unspecified open wound, left lower leg, initial encounter: Secondary | ICD-10-CM | POA: Diagnosis not present

## 2016-09-30 NOTE — Progress Notes (Addendum)
VAUGHAN, GARFINKLE ([id number]) Visit Report for 09/29/2016 Chief Complaint Document Details Patient Name: [name], [name] Date of Service: 09/29/2016 2:30 PM Medical Record Number: [id number] Patient Account Number: [id number] Date of Birth/Sex: Dec 05, 1940 (76 y.o. Male) Treating RN: Phillis Haggis Primary Care Provider: Georgann Housekeeper Other Clinician: Referring Provider: Georgann Housekeeper Treating Provider/Extender: Rudene Re in Treatment: 2 Information Obtained from: Patient Chief Complaint Patient seen for complaints of Non-Healing Wound left lower lateral leg for 3 weeks Electronic Signature(s) Signed: 09/29/2016 2:59:07 PM By: Evlyn Kanner MD, FACS Entered By: Evlyn Kanner on 09/29/2016 14:59:06 [place], [name] ([id number]) -------------------------------------------------------------------------------- HPI Details Patient Name: [name] Date of Service: 09/29/2016 2:30 PM Medical Record Number: [id number] Patient Account Number: [id number] Date of Birth/Sex: [birth year] (76 y.o. Male) Treating RN: Phillis Haggis Primary Care Provider: Georgann Housekeeper Other Clinician: Referring Provider: Georgann Housekeeper Treating Provider/Extender: Rudene Re in Treatment: 2 History of Present Illness Location: left lower lateral leg Quality: Patient reports experiencing a sharp pain to affected area(s). Severity: Patient states wound are getting worse. Duration: Patient has had the wound for < 4 weeks prior to presenting for treatment Timing: Pain in wound is Intermittent (comes and goes Context: The wound occurred when the patient tripped and had a fall and injured his left leg Modifying Factors: Other treatment(s) tried include:had gone to an urgent care couple of times a day taken an x-ray and was put on antibiotics Associated Signs and Symptoms: Patient reports having increase swelling and redness to the leg HPI Description: 76 year old gentleman who had a injury to the left ankle and  foot 3 weeks ago was seen recently in the ER after he had swelling and redness of the left ankle. He is also known to have a flareup of gout recently. After he was examined in the urgent care he was placed on Keflex and Bactrim and referred to the wound center. A recent x-ray of the left ankle did not show any fractures or abnormality. The patient doesn't take very good care of his health has never been worked up for chronic lymphedema which she's had and sees a physician about once a year. He has not been aware that he has bilateral lower extremity lymphedema. he does not smoke. He has never had a workup for his lymphedema. 09/29/2016 -- the patient's insurance will not cover the snap vacuum system and he is finding it difficult to get home health as his copayment is $25 each time. He will not be able to afford the [employer]CI wound VAC either. Electronic Signature(s) Signed: 09/29/2016 3:00:27 PM By: Evlyn Kanner MD, FACS Entered By: Evlyn Kanner on 09/29/2016 15:00:26 BRAXTYN, BOJARSKI ([id number]) -------------------------------------------------------------------------------- Physical Exam Details Patient Name: [name] Date of Service: 09/29/2016 2:30 PM Medical Record Number: [id number] Patient Account Number: [id number] Date of Birth/Sex: [birth year] (76 y.o. Male) Treating RN: Phillis Haggis Primary Care Provider: Georgann Housekeeper Other Clinician: Referring Provider: Georgann Housekeeper Treating Provider/Extender: Rudene Re in Treatment: 2 Constitutional . Pulse regular. Respirations normal and unlabored. Afebrile. . Eyes Nonicteric. Reactive to light. Ears, Nose, Mouth, and Throat Lips, teeth, and gums WNL.Marland Kitchen Moist mucosa without lesions. Neck supple and nontender. No palpable supraclavicular or cervical adenopathy. Normal sized without goiter. Respiratory WNL. No retractions.. Cardiovascular Pedal Pulses WNL. No clubbing, cyanosis or edema. Lymphatic No adneopathy. No  adenopathy. No adenopathy. Musculoskeletal Adexa without tenderness or enlargement.. Digits and nails w/o clubbing, cyanosis, infection, petechiae, ischemia, or inflammatory conditions.. Integumentary (Hair, Skin) No suspicious lesions. No crepitus or fluctuance. No peri-wound warmth or  erythema. No masses.Marland Kitchen. Psychiatric Judgement and insight Intact.. No evidence of depression, anxiety, or agitation.. Notes the wound continues to have significant amount of depth especially up towards the 12:00 position and undermining continues between 2-3 cm. No sharp debridement was required today. Electronic Signature(s) Signed: 09/29/2016 3:01:25 PM By: Evlyn KannerBritto, Elcie Pelster MD, FACS Entered By: Evlyn KannerBritto, Antawn Sison on 09/29/2016 15:01:24 [place]HUGHES, Kathlene NovemberMIKE ([id number]) -------------------------------------------------------------------------------- Physician Orders Details Patient Name: Trevor LackHUGHES, [name] Date of Service: 09/29/2016 2:30 PM Medical Record Number: [id number] Patient Account Number: [id number] Date of Birth/Sex: [birth year](75 y.o. Male) Treating RN: Phillis HaggisPinkerton, Debi Primary Care Provider: Georgann HousekeeperHUSAIN, KARRAR Other Clinician: Referring Provider: Georgann HousekeeperHUSAIN, KARRAR Treating Provider/Extender: Rudene ReBritto, Charliene Inoue Weeks in Treatment: 2 Verbal / Phone Orders: Yes ClinicianAshok Cordia: Pinkerton, Debi Read Back and Verified: Yes Diagnosis Coding Wound Cleansing Wound #1 Left,Lateral Lower Leg o Clean wound with Normal Saline. o Cleanse wound with mild soap and water Anesthetic Wound #1 Left,Lateral Lower Leg o Topical Lidocaine 4% cream applied to wound bed prior to debridement - for clinic use Skin Barriers/Peri-Wound Care Wound #1 Left,Lateral Lower Leg o Moisturizing lotion - not on wound Primary Wound Dressing Wound #1 Left,Lateral Lower Leg o Aquacel Ag - rope, pack into undermining lightly Secondary Dressing Wound #1 Left,Lateral Lower Leg o ABD pad o Dry Gauze Dressing Change Frequency Wound #1 Left,Lateral  Lower Leg o Three times weekly Follow-up Appointments Wound #1 Left,Lateral Lower Leg o Return Appointment in 1 week. Edema Control Wound #1 Left,Lateral Lower Leg o Kerlix and Coban - Left Lower Extremity - 3cm from toes to 3 cm from knee unna to anchor o Elevate legs to the level of the heart and pump ankles as often as possible Foot, [name] ([id number]) Additional Orders / Instructions o Increase protein intake. o Other: - Make an appointment to see your primary care physician. Home Health Wound #1 Left,Lateral Lower Leg o Initiate Home Health for Skilled Nursing o Home Health Nurse may visit PRN to address patientos wound care needs. o FACE TO FACE ENCOUNTER: MEDICARE and MEDICAID PATIENTS: I certify that this patient is under my care and that I had a face-to-face encounter that meets the physician face-to-face encounter requirements with this patient on this date. The encounter with the patient was in whole or in part for the following MEDICAL CONDITION: (primary reason for Home Healthcare) MEDICAL NECESSITY: I certify, that based on my findings, NURSING services are a medically necessary home health service. HOME BOUND STATUS: I certify that my clinical findings support that this patient is homebound (i.e., Due to illness or injury, pt requires aid of supportive devices such as crutches, cane, wheelchairs, walkers, the use of special transportation or the assistance of another person to leave their place of residence. There is a normal inability to leave the home and doing so requires considerable and taxing effort. Other absences are for medical reasons / religious services and are infrequent or of short duration when for other reasons). o If current dressing causes regression in wound condition, may D/C ordered dressing product/s and apply Normal Saline Moist Dressing daily until next Wound Healing Center / Other MD appointment. Notify Wound Healing Center  of regression in wound condition at [phone]. o Please direct any NON-WOUND related issues/requests for orders to patient's Primary Care Physician Medications-please add to medication list. Wound #1 Left,Lateral Lower Leg o P.O. Antibiotics - Continue taking your oral antibiotics as prescribed. Electronic Signature(s) Signed: 10/05/2016 4:50:29 PM By: Alejandro MullingPinkerton, Debra Signed: 10/06/2016 8:11:19 AM By: Evlyn KannerBritto, Rondalyn Belford MD, FACS Previous Signature: 09/29/2016  3:31:51 PM Version By: Evlyn Kanner MD, FACS Previous Signature: 09/29/2016 4:11:28 PM Version By: Alejandro Mulling Entered By: Alejandro Mulling on 10/05/2016 13:21:34 [place], [name] ([id number]) -------------------------------------------------------------------------------- Problem List Details Patient Name: [name] Date of Service: 09/29/2016 2:30 PM Medical Record Number: [id number] Patient Account Number: [id number] Date of Birth/Sex: 07/16/1940 (76 y.o. 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 Male) Treating RN: Phillis Haggis Primary Care Provider: Georgann Housekeeper Other Clinician: Referring Provider: Georgann Housekeeper Treating Provider/Extender: Rudene Re in Treatment: 2 Subjective Chief Complaint Information obtained from Patient Patient seen for complaints of Non-Healing Wound left lower lateral leg for 3 weeks History of Present Illness (HPI) The following HPI elements were documented for the patient's wound: Location: left lower lateral leg Quality: Patient reports experiencing a sharp pain to affected area(s). Severity: Patient states wound are getting worse. Duration: Patient has had the wound for < 4 weeks prior to presenting for treatment Timing: Pain in wound is Intermittent (comes and goes Context: The wound occurred when the patient tripped and had a fall and injured his left leg Modifying Factors: Other treatment(s) tried include:had gone to an urgent care couple of times a day taken an x-ray and was put on antibiotics Associated Signs and Symptoms: Patient reports having increase swelling and redness to the leg 76 year old gentleman who had a injury to the left ankle and foot 3 weeks ago was seen recently in the ER after he had swelling and redness of the left ankle. He is also known to have a flareup of gout recently. After he was examined in the urgent care he was placed on Keflex and Bactrim and referred to the wound center. A recent x-ray of the left ankle did not show any fractures or abnormality. The patient doesn't take very good care of his health has never been worked up for chronic lymphedema which she's had and sees a physician about once a year. He has not been aware that he has bilateral lower extremity lymphedema. he does not smoke. He has never had a workup for his lymphedema. 09/29/2016 -- the patient's  insurance will not cover the snap vacuum system and he is finding it difficult to get home health as his copayment is $25 each time. He will not be able to afford the [employer] wound VAC either. Objective Constitutional Pulse regular. Respirations normal and unlabored. Afebrile. [place], [name] ([id number]) Vitals Time Taken: 2:39 PM, Height: 72 in, Weight: 236 lbs, BMI: 32, Temperature: 97.9 F, Pulse: 61 bpm, Respiratory Rate: 18 breaths/min, Blood Pressure: 137/61 mmHg. Eyes Nonicteric. Reactive to light. Ears, Nose, Mouth, and Throat Lips, teeth, and gums WNL.Marland Kitchen Moist mucosa without lesions. Neck supple and nontender. No palpable supraclavicular or cervical adenopathy.  Normal sized without goiter. Respiratory WNL. No retractions.. Cardiovascular Pedal Pulses WNL. No clubbing, cyanosis or edema. Lymphatic No adneopathy. No adenopathy. No adenopathy. Musculoskeletal Adexa without tenderness or enlargement.. Digits and nails w/o clubbing, cyanosis, infection, petechiae, ischemia, or inflammatory conditions.Marland Kitchen Psychiatric Judgement and insight Intact.. No evidence of depression, anxiety, or agitation.. General Notes: the wound continues to have significant amount of depth especially up towards the 12:00 position and undermining continues between 2-3 cm. No sharp debridement was required today. Integumentary (Hair, Skin) No suspicious lesions. No crepitus or fluctuance. No peri-wound warmth or erythema. No masses.. Wound #1 status is Open. Original cause of wound was Trauma. The wound is located on the Left,Lateral Lower Leg. The wound measures 2.4cm length x 2cm width x 0.6cm depth; 3.77cm^2 area and 2.262cm^3 volume. There is no tunneling noted, however, there is undermining starting at 11:00 and ending at 1:00 with a maximum distance of 1.4cm. There is a large amount of serosanguineous drainage noted. The wound margin is distinct with the outline attached to the wound base. There is large  (67-100%) granulation within the wound bed. There is a small (1-33%) amount of necrotic tissue within the wound bed including Adherent Slough. The periwound skin appearance exhibited: Erythema. The surrounding wound skin color is noted with erythema which is circumferential. Periwound temperature was noted as No Abnormality. The periwound has tenderness on palpation. 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 Cleanse wound with mild soap and water Anesthetic: Wound #1 Left,Lateral Lower Leg: Topical Lidocaine 4% cream applied to wound bed prior to debridement - for clinic use Skin Barriers/Peri-Wound Care: Wound #1 Left,Lateral Lower Leg: Moisturizing lotion - not on wound Primary Wound Dressing: Wound #1 Left,Lateral Lower Leg: Aquacel Ag - rope, pack into undermining lightly Secondary Dressing: Wound #1 Left,Lateral Lower Leg: ABD pad Dry Gauze Dressing Change Frequency: Wound #1 Left,Lateral Lower Leg: Three times weekly Follow-up Appointments: Wound #1 Left,Lateral Lower Leg: Return Appointment in 1 week. Edema Control: Wound #1 Left,Lateral Lower Leg: Kerlix and Coban - Left Lower Extremity - 3cm from toes to 3 cm from knee unna to anchor Elevate legs to the level of the heart and pump ankles as often as possible Additional Orders / Instructions: Increase protein intake. Other: - Make an appointment to see your primary care physician. Home Health: Wound #1 Left,Lateral Lower Leg: [facility] ARNOLDO, HILDRETH  ([id number]) Home Health Nurse may visit PRN to address patient s wound care needs. FACE TO FACE ENCOUNTER: MEDICARE and MEDICAID PATIENTS: I certify that this patient is under my care and that I had a face-to-face encounter that meets the physician face-to-face encounter requirements with this patient on this date. The encounter with the patient was in whole or in part for the following MEDICAL CONDITION: (primary reason for Home Healthcare) MEDICAL NECESSITY: I certify, that based on my findings, NURSING services are a medically necessary home health service. HOME BOUND STATUS: I certify that my clinical findings support that this patient is homebound (i.e., Due to illness or injury, pt requires aid of supportive devices such as crutches, cane, wheelchairs, walkers, the use  of special transportation or the assistance of another person to leave their place of residence. There is a normal inability to leave the home and doing so requires considerable and taxing effort. Other absences are for medical reasons / religious services and are infrequent or of short duration when for other reasons). If current dressing causes regression in wound condition, may D/C ordered dressing product/s and apply Normal Saline Moist Dressing daily until next Wound Healing Center / Other MD appointment. Notify Wound Healing Center of regression in wound condition at [phone]. Please direct any NON-WOUND related issues/requests for orders to patient's Primary Care Physician Medications-please add to medication list.: Wound #1 Left,Lateral Lower Leg: P.O. Antibiotics - Continue taking your oral antibiotics as prescribed. The patient's insurance will not cover the snap vacuum or the KCI vacuum as his copayment is rather high forvisits. There is good resolution of his edema and his cellulitis and after reviewing the wound today I have recommended: 1. Packing the wound with silver alginate rope and applying a light  Kerlix and Coban dressing 2. Elevation and exercise. 3. Regular visits to the wound center I have answered all his questions and he will be compliant -- I have asked him to bring a family member along so that we can place them about the wound dressing changes Electronic Signature(s) Signed: 10/06/2016 8:12:08 AM By: Evlyn Kanner MD, FACS Previous Signature: 09/29/2016 3:03:06 PM Version By: Evlyn Kanner MD, FACS Entered By: Evlyn Kanner on 10/06/2016 08:12:08 [place], Kathlene November ([id number]) -------------------------------------------------------------------------------- SuperBill Details Patient Name: [name] Date of Service: 09/29/2016 Medical Record Number: [id number] Patient Account Number: [id number] Date of Birth/Sex: May 18, 1940 (76 y.o. Male) Treating RN: Phillis Haggis Primary Care Provider: Georgann Housekeeper Other Clinician: Referring Provider: Georgann Housekeeper Treating Provider/Extender: Rudene Re in Treatment: 2 Diagnosis Coding ICD-10 Codes Code Description [phone] Laceration without foreign body, left lower leg, initial encounter [phone] Other soft tissue disorders related to use, overuse and pressure, left lower leg I89.0 Lymphedema, not elsewhere classified L03.116 Cellulitis of left lower limb L97.322 Non-pressure chronic ulcer of left ankle with fat layer exposed Facility Procedures CPT4 Code: [id number] Description: 99213 - WOUND CARE VISIT-LEV 3 EST PT Modifier: Quantity: 1 Physician Procedures CPT4: Description Modifier Quantity Code [id number] 99213 - WC PHYS LEVEL 3 - EST PT 1 ICD-10 Description Diagnosis S81.812A Laceration without foreign body, left lower leg, initial encounter M70.862 Other soft tissue disorders related to use, overuse and  pressure, left lower leg I89.0 Lymphedema, not elsewhere classified L97.322 Non-pressure chronic ulcer of left ankle with fat layer exposed Electronic Signature(s) Signed: 09/29/2016 3:31:51 PM By: Evlyn Kanner MD,  FACS Signed: 09/29/2016 4:11:28 PM By: Alejandro Mulling Previous Signature: 09/29/2016 3:03:21 PM Version By: Evlyn Kanner MD, FACS Entered By: Alejandro Mulling on 09/29/2016 15:08:38

## 2016-09-30 NOTE — Progress Notes (Signed)
STEPAN, VERRETTE ([id number]) Visit Report for 09/29/2016 Arrival Information Details Patient Name: [name], [name] Date of Service: 09/29/2016 2:30 PM Medical Record Number: [id number] Patient Account Number: [id number] Date of Birth/Sex: 1940/07/17 (76 y.o. Male) Treating RN: Phillis Haggis Primary Care Demorio Seeley: Georgann Housekeeper Other Clinician: Referring Breeonna Mone: Georgann Housekeeper Treating Dawsyn Zurn/Extender: Rudene Re in Treatment: 2 Visit Information History Since Last Visit All ordered tests and consults were completed: No Patient Arrived: [name] Added or deleted any medications: No Arrival Time: 14:35 Any new allergies or adverse reactions: No Accompanied By: self Had a fall or experienced change in No Transfer Assistance: EasyPivot Patient activities of daily living that may affect Lift risk of falls: Patient Identification Verified: Yes Signs or symptoms of abuse/neglect since last No Secondary Verification Process Yes visito Completed: Hospitalized since last visit: No Patient Requires Transmission- No Has Dressing in Place as Prescribed: Yes Based Precautions: Has Compression in Place as Prescribed: Yes Patient Has Alerts: Yes Pain Present Now: No Patient Alerts: L ABI non- compressible Electronic Signature(s) Signed: 09/29/2016 4:11:28 PM By: Alejandro Mulling Entered By: Alejandro Mulling on 09/29/2016 14:35:55 [place], [name] ([id number]) -------------------------------------------------------------------------------- Clinic Level of Care Assessment Details Patient Name: [name] Date of Service: 09/29/2016 2:30 PM Medical Record Number: [id number] Patient Account Number: [id number] Date of Birth/Sex: 1940/05/21 (76 y.o. Male) Treating RN: Phillis Haggis Primary Care Abria Vannostrand: Georgann Housekeeper Other Clinician: Referring Brette Cast: Georgann Housekeeper Treating Mariangela Heldt/Extender: Rudene Re in Treatment: 2 Clinic Level of Care Assessment Items TOOL 4 Quantity  Score X - Use when only an EandM is performed on FOLLOW-UP visit 1 0 ASSESSMENTS - Nursing Assessment / Reassessment X - Reassessment of Co-morbidities (includes updates in patient status) 1 10 X - Reassessment of Adherence to Treatment Plan 1 5 ASSESSMENTS - Wound and Skin Assessment / Reassessment X - Simple Wound Assessment / Reassessment - one wound 1 5 []  - Complex Wound Assessment / Reassessment - multiple wounds 0 []  - Dermatologic / Skin Assessment (not related to wound area) 0 ASSESSMENTS - Focused Assessment []  - Circumferential Edema Measurements - multi extremities 0 []  - Nutritional Assessment / Counseling / Intervention 0 []  - Lower Extremity Assessment (monofilament, tuning fork, pulses) 0 []  - Peripheral Arterial Disease Assessment (using hand held doppler) 0 ASSESSMENTS - Ostomy and/or Continence Assessment and Care []  - Incontinence Assessment and Management 0 []  - Ostomy Care Assessment and Management (repouching, etc.) 0 PROCESS - Coordination of Care []  - Simple Patient / Family Education for ongoing care 0 X - Complex (extensive) Patient / Family Education for ongoing care 1 20 X - Staff obtains Chiropractor, Records, Test Results / Process Orders 1 10 X - Staff telephones HHA, Nursing Homes / Clarify orders / etc 1 10 []  - Routine Transfer to another Facility (non-emergent condition) 0 [place], [name] ([id number]) []  - Routine Hospital Admission (non-emergent condition) 0 []  - New Admissions / Manufacturing engineer / Ordering NPWT, Apligraf, etc. 0 []  - Emergency Hospital Admission (emergent condition) 0 X - Simple Discharge Coordination 1 10 []  - Complex (extensive) Discharge Coordination 0 PROCESS - Special Needs []  - Pediatric / Minor Patient Management 0 []  - Isolation Patient Management 0 []  - Hearing / Language / Visual special needs 0 []  - Assessment of Community assistance (transportation, D/C planning, etc.) 0 []  - Additional assistance / Altered mentation  0 []  - Support Surface(s) Assessment (bed, cushion, seat, etc.) 0 INTERVENTIONS - Wound Cleansing / Measurement X - Simple Wound Cleansing - one wound 1 5 []  -  Complex Wound Cleansing - multiple wounds 0 X - Wound Imaging (photographs - any number of wounds) 1 5 []  - Wound Tracing (instead of photographs) 0 X - Simple Wound Measurement - one wound 1 5 []  - Complex Wound Measurement - multiple wounds 0 INTERVENTIONS - Wound Dressings []  - Small Wound Dressing one or multiple wounds 0 []  - Medium Wound Dressing one or multiple wounds 0 X - Large Wound Dressing one or multiple wounds 1 20 X - Application of Medications - topical 1 5 []  - Application of Medications - injection 0 INTERVENTIONS - Miscellaneous []  - External ear exam 0 [place], [name] ([id number]) []  - Specimen Collection (cultures, biopsies, blood, body fluids, etc.) 0 []  - Specimen(s) / Culture(s) sent or taken to Lab for analysis 0 []  - Patient Transfer (multiple staff / Michiel SitesHoyer Lift / Similar devices) 0 []  - Simple Staple / Suture removal (25 or less) 0 []  - Complex Staple / Suture removal (26 or more) 0 []  - Hypo / Hyperglycemic Management (close monitor of Blood Glucose) 0 []  - Ankle / Brachial Index (ABI) - do not check if billed separately 0 X - Vital Signs 1 5 Has the patient been seen at the hospital within the last three years: Yes Total Score: 115 Level Of Care: New/Established - Level 3 Electronic Signature(s) Signed: 09/29/2016 4:11:28 PM By: Alejandro MullingPinkerton, Debra Entered By: Alejandro MullingPinkerton, Debra on 09/29/2016 15:08:30 [place], [name] ([id number]) -------------------------------------------------------------------------------- Encounter Discharge Information Details Patient Name: Trevor LackHUGHES, [name] Date of Service: 09/29/2016 2:30 PM Medical Record Number: [id number] Patient Account Number: [id number] Date of Birth/Sex: 1941/03/31 40(76 y.o. Male) Treating RN: Phillis HaggisPinkerton, Debi Primary Care Cayden Rautio: Georgann HousekeeperHUSAIN, KARRAR Other  Clinician: Referring Correy Weidner: Georgann HousekeeperHUSAIN, KARRAR Treating Skyann Ganim/Extender: Rudene ReBritto, Errol Weeks in Treatment: 2 Encounter Discharge Information Items Discharge Pain Level: 0 Discharge Condition: Stable Ambulatory Status: Cane Discharge Destination: Home Private Transportation: Auto Accompanied By: self Schedule Follow-up Appointment: Yes Medication Reconciliation completed and No provided to Patient/Care Ameet Sandy: Clinical Summary of Care: Electronic Signature(s) Signed: 09/29/2016 4:11:28 PM By: Alejandro MullingPinkerton, Debra Entered By: Alejandro MullingPinkerton, Debra on 09/29/2016 14:50:17 [name] ([id number]) -------------------------------------------------------------------------------- Lower Extremity Assessment Details Patient Name: Trevor LackHUGHES, [name] Date of Service: 09/29/2016 2:30 PM Medical Record Number: [id number] Patient Account Number: [id number] Date of Birth/Sex: 1941/03/31 (75 y.o. Male) Treating RN: Phillis HaggisPinkerton, Debi Primary Care Sherrin Stahle: Georgann HousekeeperHUSAIN, KARRAR Other Clinician: Referring Bellah Alia: Georgann HousekeeperHUSAIN, KARRAR Treating Beryl Balz/Extender: Rudene ReBritto, Errol Weeks in Treatment: 2 Edema Assessment Assessed: [Left: No] [Right: No] E[Left: dema] [Right: :] Calf Left: Right: Point of Measurement: 35 cm From Medial Instep 40.5 cm cm Ankle Left: Right: Point of Measurement: 13 cm From Medial Instep 27 cm cm Vascular Assessment Pulses: Dorsalis Pedis Palpable: [Left:Yes] Posterior Tibial Extremity colors, hair growth, and conditions: Extremity Color: [Left:Red] Temperature of Extremity: [Left:Warm] Capillary Refill: [Left:< 3 seconds] Toe Nail Assessment Left: Right: Thick: No Discolored: No Deformed: No Improper Length and Hygiene: No Electronic Signature(s) Signed: 09/29/2016 4:11:28 PM By: Alejandro MullingPinkerton, Debra Entered By: Alejandro MullingPinkerton, Debra on 09/29/2016 14:49:24 [place], [name] ([id number]) -------------------------------------------------------------------------------- Multi Wound Chart  Details Patient Name: Trevor LackHUGHES, [name] Date of Service: 09/29/2016 2:30 PM Medical Record Number: [id number] Patient Account Number: [id number] Date of Birth/Sex: 1941/03/31 86(75 y.o. Male) Treating RN: Phillis HaggisPinkerton, Debi Primary Care Osmara Drummonds: Georgann HousekeeperHUSAIN, KARRAR Other Clinician: Referring Tiffanie Blassingame: Georgann HousekeeperHUSAIN, KARRAR Treating Reg Bircher/Extender: Rudene ReBritto, Errol Weeks in Treatment: 2 Vital Signs Height(in): 72 Pulse(bpm): 61 Weight(lbs): 236 Blood Pressure 137/61 (mmHg): Body Mass Index(BMI): 32 Temperature(F): 97.9 Respiratory Rate 18 (breaths/min): Photos: [1:No Photos] [N/A:N/A] Wound Location: [1:Left Lower Leg - Lateral] [N/A:N/A]  Wounding Event: [1:Trauma] [N/A:N/A] Primary Etiology: [1:Trauma, Other] [N/A:N/A] Comorbid History: [1:Cataracts, Hypertension, Gout] [N/A:N/A] Date Acquired: [1:08/25/2016] [N/A:N/A] Weeks of Treatment: [1:2] [N/A:N/A] Wound Status: [1:Open] [N/A:N/A] Measurements L x W x D 2.4x2x0.6 [N/A:N/A] (cm) Area (cm) : [1:3.77] [N/A:N/A] Volume (cm) : [1:2.262] [N/A:N/A] % Reduction in Area: [1:-12.90%] [N/A:N/A] % Reduction in Volume: -577.20% [N/A:N/A] Starting Position 1 11 (o'clock): Ending Position 1 [1:1] (o'clock): Maximum Distance 1 1.4 (cm): Undermining: [1:Yes] [N/A:N/A] Classification: [1:Partial Thickness] [N/A:N/A] Exudate Amount: [1:Large] [N/A:N/A] Exudate Type: [1:Serosanguineous] [N/A:N/A] Exudate Color: [1:red, brown] [N/A:N/A] Wound Margin: [1:Distinct, outline attached] [N/A:N/A] Granulation Amount: [1:Large (67-100%)] [N/A:N/A] Necrotic Amount: [1:Small (1-33%)] [N/A:N/A] Epithelialization: None N/A N/A Periwound Skin Texture: No Abnormalities Noted N/A N/A Periwound Skin No Abnormalities Noted N/A N/A Moisture: Periwound Skin Color: Erythema: Yes N/A N/A Erythema Location: Circumferential N/A N/A Temperature: No Abnormality N/A N/A Tenderness on Yes N/A N/A Palpation: Wound Preparation: Ulcer Cleansing: N/A N/A Rinsed/Irrigated  with Saline Topical Anesthetic Applied: Other: lidocaine 4% Treatment Notes Wound #1 (Left, Lateral Lower Leg) 1. Cleansed with: Clean wound with Normal Saline Cleanse wound with antibacterial soap and water 2. Anesthetic Topical Lidocaine 4% cream to wound bed prior to debridement 4. Dressing Applied: Aquacel Ag 5. Secondary Dressing Applied ABD Pad Dry Gauze 7. Secured with Tape Notes kerlix, Theatre manager) Signed: 09/29/2016 2:58:26 PM By: Evlyn Kanner MD, FACS Entered By: Evlyn Kanner on 09/29/2016 14:58:26 [place], [name] ([id number]) -------------------------------------------------------------------------------- Multi-Disciplinary Care Plan Details Patient Name: [name] Date of Service: 09/29/2016 2:30 PM Medical Record Number: [id number] Patient Account Number: [id number] Date of Birth/Sex: 1940/09/27 (76 y.o. Male) Treating RN: Phillis Haggis Primary Care Duha Abair: Georgann Housekeeper Other Clinician: Referring Narcissa Melder: Georgann Housekeeper Treating Abbygale Lapid/Extender: Rudene Re in Treatment: 2 Active Inactive ` Abuse / Safety / Falls / Self Care Management Nursing Diagnoses: History of Falls Potential for falls Goals: Patient will remain injury free related to falls Date Initiated: 09/15/2016 Target Resolution Date: 11/26/2016 Goal Status: Active Interventions: Assess fall risk on admission and as needed Assess impairment of mobility on admission and as needed per policy Notes: ` Nutrition Nursing Diagnoses: Imbalanced nutrition Potential for alteratiion in Nutrition/Potential for imbalanced nutrition Goals: Patient/caregiver agrees to and verbalizes understanding of need to use nutritional supplements and/or vitamins as prescribed Date Initiated: 09/15/2016 Target Resolution Date: 12/31/2016 Goal Status: Active Interventions: Assess patient nutrition upon admission and as needed per policy Notes: ` Orientation to the Wound Care  Program [place], [place] ([id number]) Nursing Diagnoses: Knowledge deficit related to the wound healing center program Goals: Patient/caregiver will verbalize understanding of the Wound Healing Center Program Date Initiated: 09/15/2016 Target Resolution Date: 10/01/2016 Goal Status: Active Interventions: Provide education on orientation to the wound center Notes: ` Pain, Acute or Chronic Nursing Diagnoses: Pain, acute or chronic: actual or potential Potential alteration in comfort, pain Goals: Patient/caregiver will verbalize adequate pain control between visits Date Initiated: 09/15/2016 Target Resolution Date: 12/31/2016 Goal Status: Active Interventions: Complete pain assessment as per visit requirements Notes: ` Wound/Skin Impairment Nursing Diagnoses: Impaired tissue integrity Knowledge deficit related to ulceration/compromised skin integrity Goals: Ulcer/skin breakdown will have a volume reduction of 80% by week 12 Date Initiated: 09/15/2016 Target Resolution Date: 12/24/2016 Goal Status: Active Interventions: Assess patient/caregiver ability to perform ulcer/skin care regimen upon admission and as needed Notes: JAYLAND, NULL ([id number]) Electronic Signature(s) Signed: 09/29/2016 4:11:28 PM By: Alejandro Mulling Entered By: Alejandro Mulling on 09/29/2016 14:49:48 [place], [name] ([id number]) -------------------------------------------------------------------------------- Pain Assessment Details Patient Name: [name] Date of Service: 09/29/2016 2:30 PM  Medical Record Number: [id number] Patient Account Number: [id number] Date of Birth/Sex: November 23, 1940 (76 y.o. Male) Treating RN: Ashok Cordia, Debi Primary Care Sherica Paternostro: Georgann Housekeeper Other Clinician: Referring Kayhan Boardley: Georgann Housekeeper Treating Justino Boze/Extender: Rudene Re in Treatment: 2 Active Problems Location of Pain Severity and Description of Pain Patient Has Paino No Site Locations With Dressing Change: No Pain  Management and Medication Current Pain Management: Electronic Signature(s) Signed: 09/29/2016 4:11:28 PM By: Alejandro Mulling Entered By: Alejandro Mulling on 09/29/2016 14:36:01 [place], [name] ([id number]) -------------------------------------------------------------------------------- Patient/Caregiver Education Details Patient Name: [name] Date of Service: 09/29/2016 2:30 PM Medical Record Number: [id number] Patient Account Number: [id number] Date of Birth/Gender: 1940-06-14 (76 y.o. Male) Treating RN: Phillis Haggis Primary Care Physician: Georgann Housekeeper Other Clinician: Referring Physician: Georgann Housekeeper Treating Physician/Extender: Rudene Re in Treatment: 2 Education Assessment Education Provided To: Patient Education Topics Provided Wound/Skin Impairment: Handouts: Other: change dressing as ordered Methods: Demonstration, Explain/Verbal Responses: State content correctly Electronic Signature(s) Signed: 09/29/2016 4:11:28 PM By: Alejandro Mulling Entered By: Alejandro Mulling on 09/29/2016 14:50:30 [place], [name] ([id number]) -------------------------------------------------------------------------------- Wound Assessment Details Patient Name: [name] Date of Service: 09/29/2016 2:30 PM Medical Record Number: [id number] Patient Account Number: [id number] Date of Birth/Sex: 1941/03/15 (76 y.o. Male) Treating RN: Phillis Haggis Primary Care Lilybeth Vien: Georgann Housekeeper Other Clinician: Referring Lexis Potenza: Georgann Housekeeper Treating Mylei Brackeen/Extender: Rudene Re in Treatment: 2 Wound Status Wound Number: 1 Primary Etiology: Trauma, Other Wound Location: Left Lower Leg - Lateral Wound Status: Open Wounding Event: Trauma Comorbid History: Cataracts, Hypertension, Gout Date Acquired: 08/25/2016 Weeks Of Treatment: 2 Clustered Wound: No Photos Photo Uploaded By: Alejandro Mulling on 09/29/2016 16:03:40 Wound Measurements Length: (cm) 2.4 Width: (cm) 2 Depth:  (cm) 0.6 Area: (cm) 3.77 Volume: (cm) 2.262 % Reduction in Area: -12.9% % Reduction in Volume: -577.2% Epithelialization: None Tunneling: No Undermining: Yes Starting Position (o'clock): 11 Ending Position (o'clock): 1 Maximum Distance: (cm) 1.4 Wound Description Classification: Partial Thickness Foul Odor Afte Wound Margin: Distinct, outline attached Slough/Fibrino Exudate Amount: Large Exudate Type: Serosanguineous Exudate Color: red, brown r Cleansing: No No Wound Bed [place], [name] ([id number]) Granulation Amount: Large (67-100%) Necrotic Amount: Small (1-33%) Necrotic Quality: Adherent Slough Periwound Skin Texture Texture Color No Abnormalities Noted: No No Abnormalities Noted: No Erythema: Yes Moisture Erythema Location: Circumferential No Abnormalities Noted: No Temperature / Pain Temperature: No Abnormality Tenderness on Palpation: Yes Wound Preparation Ulcer Cleansing: Rinsed/Irrigated with Saline Topical Anesthetic Applied: Other: lidocaine 4%, Treatment Notes Wound #1 (Left, Lateral Lower Leg) 1. Cleansed with: Clean wound with Normal Saline Cleanse wound with antibacterial soap and water 2. Anesthetic Topical Lidocaine 4% cream to wound bed prior to debridement 4. Dressing Applied: Aquacel Ag 5. Secondary Dressing Applied ABD Pad Dry Gauze 7. Secured with Tape Notes kerlix, coban Electronic Signature(s) Signed: 09/29/2016 4:11:28 PM By: Alejandro Mulling Entered By: Alejandro Mulling on 09/29/2016 14:45:23 [place], [name] ([id number]) -------------------------------------------------------------------------------- Vitals Details Patient Name: [name] Date of Service: 09/29/2016 2:30 PM Medical Record Number: [id number] Patient Account Number: [id number] Date of Birth/Sex: [birth year] (76 y.o. Male) Treating RN: Phillis Haggis Primary Care Kilie Rund: Georgann Housekeeper Other Clinician: Referring Ty Oshima: Georgann Housekeeper Treating Jaspreet Hollings/Extender:  Rudene Re in Treatment: 2 Vital Signs Time Taken: 14:39 Temperature (F): 97.9 Height (in): 72 Pulse (bpm): 61 Weight (lbs): 236 Respiratory Rate (breaths/min): 18 Body Mass Index (BMI): 32 Blood Pressure (mmHg): 137/61 Reference Range: 80 - 120 mg / dl Electronic Signature(s) Signed: 09/29/2016 4:11:28 PM By: Alejandro Mulling Entered By: Alejandro Mulling on 09/29/2016 14:39:29

## 2016-10-06 ENCOUNTER — Ambulatory Visit: Payer: Self-pay | Admitting: Surgery

## 2016-10-06 DIAGNOSIS — L03116 Cellulitis of left lower limb: Secondary | ICD-10-CM | POA: Diagnosis not present

## 2016-10-06 DIAGNOSIS — S81812D Laceration without foreign body, left lower leg, subsequent encounter: Secondary | ICD-10-CM | POA: Diagnosis not present

## 2016-10-06 DIAGNOSIS — I890 Lymphedema, not elsewhere classified: Secondary | ICD-10-CM | POA: Diagnosis not present

## 2016-10-07 ENCOUNTER — Encounter: Payer: PPO | Admitting: Surgery

## 2016-10-07 DIAGNOSIS — S81802A Unspecified open wound, left lower leg, initial encounter: Secondary | ICD-10-CM | POA: Diagnosis not present

## 2016-10-07 DIAGNOSIS — S81812A Laceration without foreign body, left lower leg, initial encounter: Secondary | ICD-10-CM | POA: Diagnosis not present

## 2016-10-10 DIAGNOSIS — L97329 Non-pressure chronic ulcer of left ankle with unspecified severity: Secondary | ICD-10-CM | POA: Diagnosis not present

## 2016-10-10 DIAGNOSIS — I10 Essential (primary) hypertension: Secondary | ICD-10-CM | POA: Diagnosis not present

## 2016-10-10 DIAGNOSIS — Z791 Long term (current) use of non-steroidal anti-inflammatories (NSAID): Secondary | ICD-10-CM | POA: Diagnosis not present

## 2016-10-10 DIAGNOSIS — Z87891 Personal history of nicotine dependence: Secondary | ICD-10-CM | POA: Diagnosis not present

## 2016-10-10 DIAGNOSIS — S81812D Laceration without foreign body, left lower leg, subsequent encounter: Secondary | ICD-10-CM | POA: Diagnosis not present

## 2016-10-10 DIAGNOSIS — Z87898 Personal history of other specified conditions: Secondary | ICD-10-CM | POA: Diagnosis not present

## 2016-10-10 DIAGNOSIS — R5381 Other malaise: Secondary | ICD-10-CM | POA: Diagnosis not present

## 2016-10-10 DIAGNOSIS — E6609 Other obesity due to excess calories: Secondary | ICD-10-CM | POA: Diagnosis not present

## 2016-10-10 DIAGNOSIS — I890 Lymphedema, not elsewhere classified: Secondary | ICD-10-CM | POA: Diagnosis not present

## 2016-10-10 DIAGNOSIS — Z125 Encounter for screening for malignant neoplasm of prostate: Secondary | ICD-10-CM | POA: Diagnosis not present

## 2016-10-10 DIAGNOSIS — Z7689 Persons encountering health services in other specified circumstances: Secondary | ICD-10-CM | POA: Diagnosis not present

## 2016-10-10 DIAGNOSIS — Z6832 Body mass index (BMI) 32.0-32.9, adult: Secondary | ICD-10-CM | POA: Diagnosis not present

## 2016-10-10 DIAGNOSIS — R5383 Other fatigue: Secondary | ICD-10-CM | POA: Diagnosis not present

## 2016-10-10 DIAGNOSIS — L03116 Cellulitis of left lower limb: Secondary | ICD-10-CM | POA: Diagnosis not present

## 2016-10-10 DIAGNOSIS — M109 Gout, unspecified: Secondary | ICD-10-CM | POA: Diagnosis not present

## 2016-10-10 DIAGNOSIS — Z8739 Personal history of other diseases of the musculoskeletal system and connective tissue: Secondary | ICD-10-CM | POA: Diagnosis not present

## 2016-10-10 DIAGNOSIS — R29898 Other symptoms and signs involving the musculoskeletal system: Secondary | ICD-10-CM | POA: Diagnosis not present

## 2016-10-10 NOTE — Progress Notes (Signed)
[name], [name] ([id number]) Visit Report for 10/07/2016 Chief Complaint Document Details Patient Name: [name], [name] Date of Service: 10/07/2016 12:30 PM Medical Record Number: [id number] Patient Account Number: [id number] Date of Birth/Sex: July 15, 1940 [age] y.o. Male) Treating RN: Phillis HaggisPinkerton, Debi Primary Care Provider: Georgann HousekeeperHUSAIN, KARRAR Other Clinician: Referring Provider: Georgann HousekeeperHUSAIN, KARRAR Treating Provider/Extender: Rudene ReBritto, Clerence Gubser Weeks in Treatment: 3 Information Obtained from: Patient Chief Complaint Patient seen for complaints of Non-Healing Wound left lower lateral leg for 3 weeks Electronic Signature(s) Signed: 10/07/2016 1:11:06 PM By: Evlyn KannerBritto, Edit Ricciardelli MD, FACS Entered By: Evlyn KannerBritto, Daren Yeagle on 10/07/2016 13:11:05 [name], [name] ([id number]) -------------------------------------------------------------------------------- Debridement Details Patient Name: [name], [name] Date of Service: 10/07/2016 12:30 PM Medical Record Number: [id number] Patient Account Number: [id number] Date of Birth/Sex: July 15, 1940 [age] y.o. Male) Treating RN: Phillis HaggisPinkerton, Debi Primary Care Provider: Georgann HousekeeperHUSAIN, KARRAR Other Clinician: Referring Provider: Georgann HousekeeperHUSAIN, KARRAR Treating Provider/Extender: Rudene ReBritto, Bernardina Cacho Weeks in Treatment: 3 Debridement Performed for Wound #1 Left,Lateral Lower Leg Assessment: Performed By: Physician Evlyn KannerBritto, Aashi Derrington, MD Debridement: Debridement Pre-procedure Verification/Time Out Yes - 12:51 Taken: Start Time: 12:52 Pain Control: Lidocaine 4% Topical Solution Level: Skin/Subcutaneous Tissue Total Area Debrided (L x 2.3 (cm) x 2 (cm) = 4.6 (cm) W): Tissue and other Viable, Non-Viable, Exudate, Fibrin/Slough, Subcutaneous material debrided: Instrument: Curette Bleeding: Minimum Hemostasis Achieved: Pressure End Time: 12:54 Procedural Pain: 0 Post Procedural Pain: 0 Response to Treatment: Procedure was tolerated well Post Debridement Measurements of Total Wound Length: (cm) 2.3 Width: (cm)  2 Depth: (cm) 0.6 Volume: (cm) 2.168 Character of Wound/Ulcer Post Requires Further Debridement Debridement: Post Procedure Diagnosis Same as Pre-procedure Electronic Signature(s) Signed: 10/07/2016 1:10:59 PM By: Evlyn KannerBritto, Cordel Drewes MD, FACS Signed: 10/10/2016 3:47:26 PM By: Alejandro MullingPinkerton, Debra Entered By: Evlyn KannerBritto, Juanna Pudlo on 10/07/2016 13:10:58 [name], [name] ([id number]) -------------------------------------------------------------------------------- HPI Details Patient Name: [name], [name] Date of Service: 10/07/2016 12:30 PM Medical Record Number: [id number] Patient Account Number: [id number] Date of Birth/Sex: July 15, 1940 [age] y.o. Male) Treating RN: Phillis HaggisPinkerton, Debi Primary Care Provider: Georgann HousekeeperHUSAIN, KARRAR Other Clinician: Referring Provider: Georgann HousekeeperHUSAIN, KARRAR Treating Provider/Extender: Rudene ReBritto, Bruna Dills Weeks in Treatment: 3 History of Present Illness Location: left lower lateral leg Quality: Patient reports experiencing a sharp pain to affected area(s). Severity: Patient states wound are getting worse. Duration: Patient has had the wound for < 4 weeks prior to presenting for treatment Timing: Pain in wound is Intermittent (comes and goes Context: The wound occurred when the patient tripped and had a fall and injured his left leg Modifying Factors: Other treatment(s) tried include:had gone to an urgent care couple of times a day taken an x-ray and was put on antibiotics Associated Signs and Symptoms: Patient reports having increase swelling and redness to the leg HPI Description: 76 year old gentleman who had a injury to the left ankle and foot 3 weeks ago was seen recently in the ER after he had swelling and redness of the left ankle. He is also known to have a flareup of gout recently. After he was examined in the urgent care he was placed on Keflex and Bactrim and referred to the wound center. A recent x-ray of the left ankle did not show any fractures or abnormality. The patient doesn't take very  good care of his health has never been worked up for chronic lymphedema which she's had and sees a physician about once a year. He has not been aware that he has bilateral lower extremity lymphedema. he does not smoke. He has never had a workup for his lymphedema. 09/29/2016 -- the patient's insurance will not cover the snap vacuum system and he  is finding it difficult to get home health as his copayment is $25 each time. He will not be able to afford the [employer] wound VAC either. Electronic Signature(s) Signed: 10/07/2016 1:11:12 PM By: Evlyn Kanner MD, FACS Entered By: Evlyn Kanner on 10/07/2016 13:11:12 [name], [name] ([id number]) -------------------------------------------------------------------------------- Physical Exam Details Patient Name: [name] Date of Service: 10/07/2016 12:30 PM Medical Record Number: [id number] Patient Account Number: [id number] Date of Birth/Sex: 1941-02-13 (76 y.o. Male) Treating RN: Phillis Haggis Primary Care Provider: Georgann Housekeeper Other Clinician: Referring Provider: Georgann Housekeeper Treating Provider/Extender: Rudene Re in Treatment: 3 Constitutional . Pulse regular. Respirations normal and unlabored. Afebrile. . Eyes Nonicteric. Reactive to light. Ears, Nose, Mouth, and Throat Lips, teeth, and gums WNL.Marland Kitchen Moist mucosa without lesions. Neck supple and nontender. No palpable supraclavicular or cervical adenopathy. Normal sized without goiter. Respiratory WNL. No retractions.. Breath sounds WNL, No rubs, rales, rhonchi, or wheeze.. Cardiovascular Heart rhythm and rate regular, no murmur or gallop.. Pedal Pulses WNL. No clubbing, cyanosis or edema. Chest Breasts symmetical and no nipple discharge.. Breast tissue WNL, no masses, lumps, or tenderness.. Lymphatic No adneopathy. No adenopathy. No adenopathy. Musculoskeletal Adexa without tenderness or enlargement.. Digits and nails w/o clubbing, cyanosis, infection, petechiae, ischemia, or  inflammatory conditions.. Integumentary (Hair, Skin) No suspicious lesions. No crepitus or fluctuance. No peri-wound warmth or erythema. No masses.Marland Kitchen Psychiatric Judgement and insight Intact.. No evidence of depression, anxiety, or agitation.. Notes the wound it is sharp debridement today with a #3 curet and bleeding controlled with pressure. The depth at the 12:00 position is coming in nicely. Electronic Signature(s) Signed: 10/07/2016 1:11:38 PM By: Evlyn Kanner MD, FACS Entered By: Evlyn Kanner on 10/07/2016 13:11:37 [name], [name] ([id number]) -------------------------------------------------------------------------------- Physician Orders Details Patient Name: [name] Date of Service: 10/07/2016 12:30 PM Medical Record Number: [id number] Patient Account Number: [id number] Date of Birth/Sex: [birth year] (76 y.o. Male) Treating RN: Phillis Haggis Primary Care Provider: Georgann Housekeeper Other Clinician: Referring Provider: Georgann Housekeeper Treating Provider/Extender: Rudene Re in Treatment: 3 Verbal / Phone Orders: Yes Clinician: Pinkerton, Debi Read Back and Verified: Yes Diagnosis Coding Wound Cleansing Wound #1 Left,Lateral Lower Leg o Clean wound with Normal Saline. o Cleanse wound with mild soap and water Anesthetic Wound #1 Left,Lateral Lower Leg o Topical Lidocaine 4% cream applied to wound bed prior to debridement - for clinic use Skin Barriers/Peri-Wound Care Wound #1 Left,Lateral Lower Leg o Moisturizing lotion - not on wound Primary Wound Dressing Wound #1 Left,Lateral Lower Leg o Aquacel Ag - rope, pack into undermining lightly Secondary Dressing Wound #1 Left,Lateral Lower Leg o ABD pad o Dry Gauze Dressing Change Frequency Wound #1 Left,Lateral Lower Leg o Three times weekly Follow-up Appointments Wound #1 Left,Lateral Lower Leg o Return Appointment in 1 week. Edema Control Wound #1 Left,Lateral Lower Leg o Kerlix and Coban  - Left Lower Extremity - 3cm from toes to 3 cm from knee unna to anchor o Elevate legs to the level of the heart and pump ankles as often as possible [name], [name] ([id number]) Additional Orders / Instructions o Increase protein intake. o Other: - Make an appointment to see your primary care physician. Home Health Wound #1 Left,Lateral Lower Leg o Continue Home Health Visits o Home Health Nurse may visit PRN to address patientos wound care needs. o FACE TO FACE ENCOUNTER: MEDICARE and MEDICAID PATIENTS: I certify that this patient is under my care and that I had a face-to-face encounter that meets the physician face-to-face encounter requirements with this patient on  this date. The encounter with the patient was in whole or in part for the following MEDICAL CONDITION: (primary reason for Home Healthcare) MEDICAL NECESSITY: I certify, that based on my findings, NURSING services are a medically necessary home health service. HOME BOUND STATUS: I certify that my clinical findings support that this patient is homebound (i.e., Due to illness or injury, pt requires aid of supportive devices such as crutches, cane, wheelchairs, walkers, the use of special transportation or the assistance of another person to leave their place of residence. There is a normal inability to leave the home and doing so requires considerable and taxing effort. Other absences are for medical reasons / religious services and are infrequent or of short duration when for other reasons). o If current dressing causes regression in wound condition, may D/C ordered dressing product/s and apply Normal Saline Moist Dressing daily until next Wound Healing Center / Other MD appointment. Notify Wound Healing Center of regression in wound condition at [phone]. o Please direct any NON-WOUND related issues/requests for orders to patient's Primary Care Physician Electronic Signature(s) Signed: 10/07/2016 4:13:16 PM  By: Evlyn Kanner MD, FACS Signed: 10/10/2016 3:47:26 PM By: Alejandro Mulling Entered By: Alejandro Mulling on 10/07/2016 13:10:38 [name], [name] ([id number]) -------------------------------------------------------------------------------- Problem List Details Patient Name: [name] Date of Service: 10/07/2016 12:30 PM Medical Record Number: [id number] Patient Account Number: [id number] Date of Birth/Sex: 1940-05-24 (76 y.o. 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 Male) Treating RN: Phillis Haggis Primary Care Provider: Georgann Housekeeper Other Clinician: Referring Provider: Georgann Housekeeper Treating Provider/Extender: Rudene Re in Treatment: 3 Subjective Chief Complaint Information obtained from Patient Patient seen for complaints of Non-Healing Wound left lower lateral leg for 3 weeks History of Present Illness  (HPI) The following HPI elements were documented for the patient's wound: Location: left lower lateral leg Quality: Patient reports experiencing a sharp pain to affected area(s). Severity: Patient states wound are getting worse. Duration: Patient has had the wound for < 4 weeks prior to presenting for treatment Timing: Pain in wound is Intermittent (comes and goes Context: The wound occurred when the patient tripped and had a fall and injured his left leg Modifying Factors: Other treatment(s) tried include:had gone to an urgent care couple of times a day taken an x-ray and was put on antibiotics Associated Signs and Symptoms: Patient reports having increase swelling and redness to the leg 76 year old gentleman who had a injury to the left ankle and foot 3 weeks ago was seen recently in the ER after he had swelling and redness of the left ankle. He is also known to have a flareup of gout recently. After he was examined in the urgent care he was placed on Keflex and  Bactrim and referred to the wound center. A recent x-ray of the left ankle did not show any fractures or abnormality. The patient doesn't take very good care of his health has never been worked up for chronic lymphedema which she's had and sees a physician about once a year. He has not been aware that he has bilateral lower extremity lymphedema. he does not smoke. He has never had a workup for his lymphedema. 09/29/2016 -- the patient's insurance will not cover the snap vacuum system and he is finding it difficult to get home health as his copayment is $25 each time. He will not be able to afford the [employer] wound VAC either. Objective Constitutional Pulse regular. Respirations normal and unlabored. Afebrile. [name], [name] ([id number]) Vitals Time Taken: 12:38 PM, Height: 72 in, Weight: 236 lbs, BMI: 32, Temperature: 97.7 F, Pulse: 62 bpm, Respiratory Rate: 18 breaths/min, Blood Pressure: 141/60 mmHg. Eyes Nonicteric. Reactive to  light. Ears, Nose, Mouth, and Throat Lips, teeth, and gums WNL.Marland Kitchen Moist mucosa without lesions. Neck supple and nontender. No palpable supraclavicular or cervical adenopathy. Normal sized without goiter. Respiratory WNL. No retractions.. Breath sounds WNL, No rubs, rales, rhonchi, or wheeze.. Cardiovascular Heart rhythm and rate regular, no murmur or gallop.. Pedal Pulses WNL. No clubbing, cyanosis or edema. Chest Breasts symmetical and no nipple discharge.. Breast tissue WNL, no masses, lumps, or tenderness.. Lymphatic No adneopathy. No adenopathy. No adenopathy. Musculoskeletal Adexa without tenderness or enlargement.. Digits and nails w/o clubbing, cyanosis, infection, petechiae, ischemia, or inflammatory conditions.Marland Kitchen Psychiatric Judgement and insight Intact.. No evidence of depression, anxiety, or agitation.. General Notes: the wound it is sharp debridement today with a #3 curet and bleeding controlled with pressure. The depth at the 12:00 position is coming in nicely. Integumentary (Hair, Skin) No suspicious lesions. No crepitus or fluctuance. No peri-wound warmth or erythema. No masses.. Wound #1 status is Open. Original cause of wound was Trauma. The wound is located on the Left,Lateral Lower Leg. The wound measures 2.3cm length x 2cm width x 0.6cm depth; 3.613cm^2 area and 2.168cm^3 volume. There is no tunneling noted, however, there is undermining starting at 12:00 and ending at :00 with a maximum distance of 0.7cm. There is a large amount of serosanguineous drainage noted. The wound margin is distinct with the outline attached to the wound base. There is large (67-100%) granulation within the wound bed. There is a small (1-33%) amount of necrotic tissue within the wound bed including Adherent Slough. The periwound skin appearance exhibited: Erythema. The surrounding wound skin color is noted with erythema which is circumferential. Periwound temperature was noted as No  Abnormality. The periwound has tenderness on palpation. 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 There was a Skin/Subcutaneous Tissue Debridement ([id number]) debridement with total area of 4.6 sq cm performed by Evlyn Kanner, MD. with the following instrument(s): Curette to remove Viable and Non-Viable tissue/material including Exudate, Fibrin/Slough, and Subcutaneous after achieving pain control using Lidocaine 4% Topical Solution. A time out was conducted at 12:51, prior to the start of the procedure. A Minimum amount of bleeding was controlled with Pressure. The  procedure was tolerated well with a pain level of 0 throughout and a pain level of 0 following the procedure. Post Debridement Measurements: 2.3cm length x 2cm width x 0.6cm depth; 2.168cm^3 volume. Character of Wound/Ulcer Post Debridement requires further debridement. Post procedure Diagnosis Wound #1: Same as Pre-Procedure Plan Wound Cleansing: Wound #1 Left,Lateral Lower Leg: Clean wound with Normal Saline. Cleanse wound with mild soap and water Anesthetic: Wound #1 Left,Lateral Lower Leg: Topical Lidocaine 4% cream applied to wound bed prior to debridement - for clinic use Skin Barriers/Peri-Wound Care: Wound #1 Left,Lateral Lower Leg: Moisturizing lotion - not on wound [name], [name] ([id number]) Primary Wound Dressing: Wound #1 Left,Lateral Lower Leg: Aquacel Ag - rope, pack into undermining lightly Secondary Dressing: Wound #1 Left,Lateral Lower Leg: ABD  pad Dry Gauze Dressing Change Frequency: Wound #1 Left,Lateral Lower Leg: Three times weekly Follow-up Appointments: Wound #1 Left,Lateral Lower Leg: Return Appointment in 1 week. Edema Control: Wound #1 Left,Lateral Lower Leg: Kerlix and Coban - Left Lower Extremity - 3cm from toes to 3 cm from knee unna to anchor Elevate legs to the level of the heart and pump ankles as often as possible Additional Orders / Instructions: Increase protein intake. Other: - Make an appointment to see your primary care physician. Home Health: Wound #1 Left,Lateral Lower Leg: Continue Home Health Visits Home Health Nurse may visit PRN to address patient s wound care needs. FACE TO FACE ENCOUNTER: MEDICARE and MEDICAID PATIENTS: I certify that this patient is under my care and that I had a face-to-face encounter that meets the physician face-to-face encounter requirements with this patient on this date. The encounter with the patient was in whole or in part for the following MEDICAL CONDITION: (primary reason for Home Healthcare) MEDICAL NECESSITY: I certify, that based on my findings, NURSING services are a medically necessary home health service. HOME BOUND STATUS: I certify that my clinical findings support that this patient is homebound (i.e., Due to illness or injury, pt requires aid of supportive devices such as crutches, cane, wheelchairs, walkers, the use of special transportation or the assistance of another person to leave their place of residence. There is a normal inability to leave the home and doing so requires considerable and taxing effort. Other absences are for medical reasons / religious services and are infrequent or of short duration when for other reasons). If current dressing causes regression in wound condition, may D/C ordered dressing product/s and apply Normal Saline Moist Dressing daily until next Wound Healing Center / Other MD appointment. Notify Wound Healing Center of  regression in wound condition at [phone]. Please direct any NON-WOUND related issues/requests for orders to patient's Primary Care Physician he has sorted out hisvisits at home and has been having regular dressing changes. I have recommended: 1. Packing the wound with silver alginate rope and applying a light Kerlix and Coban dressing 2. Elevation and exercise. 3. Regular visits to the wound center [name], [name] ([id number]) Electronic Signature(s) Signed: 10/07/2016 1:12:38 PM By: Evlyn Kanner MD, FACS Entered By: Evlyn Kanner on 10/07/2016 13:12:38 [place], [name] ([id number]) -------------------------------------------------------------------------------- SuperBill Details Patient Name: [name] Date of Service: 10/07/2016 Medical Record Number: [id number] Patient Account Number: [id number] Date of Birth/Sex: November 11, 1940 (76 y.o. Male) Treating RN: Phillis Haggis Primary Care Provider: Georgann Housekeeper Other Clinician: Referring Provider: Georgann Housekeeper Treating Provider/Extender: Rudene Re in Treatment: 3 Diagnosis Coding ICD-10 Codes Code Description [phone] Laceration without foreign body, left lower leg, initial encounter [phone] Other soft tissue disorders related to use, overuse  and pressure, left lower leg I89.0 Lymphedema, not elsewhere classified L03.116 Cellulitis of left lower limb L97.322 Non-pressure chronic ulcer of left ankle with fat layer exposed Facility Procedures CPT4: Description Modifier Quantity Code [id number] 11042 - DEB SUBQ TISSUE 20 SQ CM/< 1 ICD-10 Description Diagnosis S81.812A Laceration without foreign body, left lower leg, initial encounter M70.862 Other soft tissue disorders related to use, overuse  and pressure, left lower leg I89.0 Lymphedema, not elsewhere classified Physician Procedures CPT4: Description Modifier Quantity Code [id number] 11042 - WC PHYS SUBQ TISS 20 SQ CM 1 ICD-10 Description Diagnosis S81.812A Laceration without  foreign body, left lower leg, initial encounter M70.862 Other soft tissue disorders related to use, overuse and  pressure, left lower leg I89.0 Lymphedema, not elsewhere classified Electronic Signature(s) Signed: 10/07/2016 1:12:51 PM By: Evlyn Kanner MD, FACS Entered By: Evlyn Kanner on 10/07/2016 13:12:51

## 2016-10-10 NOTE — Progress Notes (Signed)
GAREY, ALLEVA ([id number]) Visit Report for 10/07/2016 Arrival Information Details Patient Name: [name], [name] Date of Service: 10/07/2016 12:30 PM Medical Record Number: [id number] Patient Account Number: [id number] Date of Birth/Sex: 04/29/1940 (76 y.o. Male) Treating RN: Phillis Haggis Primary Care Clever Geraldo: Georgann Housekeeper Other Clinician: Referring Rebekha Diveley: Georgann Housekeeper Treating Melvia Matousek/Extender: Rudene Re in Treatment: 3 Visit Information History Since Last Visit All ordered tests and consults were completed: No Patient Arrived: [name] Added or deleted any medications: No Arrival Time: 12:36 Any new allergies or adverse reactions: No Accompanied By: self Had a fall or experienced change in No Transfer Assistance: EasyPivot Patient activities of daily living that may affect Lift risk of falls: Patient Identification Verified: Yes Signs or symptoms of abuse/neglect since last No Secondary Verification Process Yes visito Completed: Hospitalized since last visit: No Patient Requires Transmission- No Has Dressing in Place as Prescribed: Yes Based Precautions: Has Compression in Place as Prescribed: Yes Patient Has Alerts: Yes Pain Present Now: No Patient Alerts: L ABI non- compressible Electronic Signature(s) Signed: 10/10/2016 3:47:26 PM By: Alejandro Mulling Entered By: Alejandro Mulling on 10/07/2016 12:38:24 [place], [name] ([id number]) -------------------------------------------------------------------------------- Encounter Discharge Information Details Patient Name: [name] Date of Service: 10/07/2016 12:30 PM Medical Record Number: [id number] Patient Account Number: [id number] Date of Birth/Sex: 19-Jun-1940 (76 y.o. Male) Treating RN: Phillis Haggis Primary Care Bransen Fassnacht: Georgann Housekeeper Other Clinician: Referring Langston Summerfield: Georgann Housekeeper Treating Shiro Ellerman/Extender: Rudene Re in Treatment: 3 Encounter Discharge Information Items Discharge Pain  Level: 0 Discharge Condition: Stable Ambulatory Status: Cane Discharge Destination: Home Transportation: Private Auto Accompanied By: self Schedule Follow-up Appointment: Yes Medication Reconciliation completed No and provided to Patient/Care Elige Shouse: Provided on Clinical Summary of Care: 10/07/2016 Form Type Recipient Paper Patient MH Electronic Signature(s) Signed: 10/07/2016 1:04:37 PM By: Gwenlyn Perking Entered By: Gwenlyn Perking on 10/07/2016 13:04:36 [place], [name] ([id number]) -------------------------------------------------------------------------------- Lower Extremity Assessment Details Patient Name: [name] Date of Service: 10/07/2016 12:30 PM Medical Record Number: [id number] Patient Account Number: [id number] Date of Birth/Sex: [birth year] (75 y.o. Male) Treating RN: Phillis Haggis Primary Care Billie Trager: Georgann Housekeeper Other Clinician: Referring Matheu Ploeger: Georgann Housekeeper Treating Geet Hosking/Extender: Rudene Re in Treatment: 3 Edema Assessment Assessed: [Left: No] [Right: No] E[Left: dema] [Right: :] Calf Left: Right: Point of Measurement: 35 cm From Medial Instep 40.4 cm cm Ankle Left: Right: Point of Measurement: 13 cm From Medial Instep 27 cm cm Vascular Assessment Pulses: Dorsalis Pedis Palpable: [Left:Yes] Posterior Tibial Extremity colors, hair growth, and conditions: Extremity Color: [Left:Normal] Temperature of Extremity: [Left:Warm] Capillary Refill: [Left:< 3 seconds] Toe Nail Assessment Left: Right: Thick: No Discolored: No Deformed: No Improper Length and Hygiene: No Electronic Signature(s) Signed: 10/10/2016 3:47:26 PM By: Alejandro Mulling Entered By: Alejandro Mulling on 10/07/2016 12:48:06 [place], [name] ([id number]) -------------------------------------------------------------------------------- Multi Wound Chart Details Patient Name: [name] Date of Service: 10/07/2016 12:30 PM Medical Record Number: [id number] Patient  Account Number: [id number] Date of Birth/Sex: May 07, 1940 (76 y.o. Male) Treating RN: Phillis Haggis Primary Care Aarik Blank: Georgann Housekeeper Other Clinician: Referring Kraig Genis: Georgann Housekeeper Treating Enyah Moman/Extender: Rudene Re in Treatment: 3 Vital Signs Height(in): 72 Pulse(bpm): 62 Weight(lbs): 236 Blood Pressure 141/60 (mmHg): Body Mass Index(BMI): 32 Temperature(F): 97.7 Respiratory Rate 18 (breaths/min): Photos: [1:No Photos] [N/A:N/A] Wound Location: [1:Left Lower Leg - Lateral] [N/A:N/A] Wounding Event: [1:Trauma] [N/A:N/A] Primary Etiology: [1:Trauma, Other] [N/A:N/A] Comorbid History: [1:Cataracts, Hypertension, Gout] [N/A:N/A] Date Acquired: [1:08/25/2016] [N/A:N/A] Weeks of Treatment: [1:3] [N/A:N/A] Wound Status: [1:Open] [N/A:N/A] Measurements L x W x D 2.3x2x0.6 [N/A:N/A] (cm) Area (cm) : [1:3.613] [N/A:N/A] Volume (  cm) : [1:2.168] [N/A:N/A] % Reduction in Area: [1:-8.20%] [N/A:N/A] % Reduction in Volume: -549.10% [N/A:N/A] Starting Position 1 12 (o'clock): Ending Position 1 (o'clock): Maximum Distance 1 0.7 (cm): Undermining: [1:Yes] [N/A:N/A] Classification: [1:Partial Thickness] [N/A:N/A] Exudate Amount: [1:Large] [N/A:N/A] Exudate Type: [1:Serosanguineous] [N/A:N/A] Exudate Color: [1:red, brown] [N/A:N/A] Wound Margin: [1:Distinct, outline attached] [N/A:N/A] Granulation Amount: [1:Large (67-100%)] [N/A:N/A] Necrotic Amount: [1:Small (1-33%)] [N/A:N/A] Epithelialization: None N/A N/A Debridement: Debridement ([id number](11042- N/A N/A 11047) Pre-procedure 12:51 N/A N/A Verification/Time Out Taken: Pain Control: Lidocaine 4% Topical N/A N/A Solution Tissue Debrided: Fibrin/Slough, Exudates, N/A N/A Subcutaneous Level: Skin/Subcutaneous N/A N/A Tissue Debridement Area (sq 4.6 N/A N/A cm): Instrument: Curette N/A N/A Bleeding: Minimum N/A N/A Hemostasis Achieved: Pressure N/A N/A Procedural Pain: 0 N/A N/A Post Procedural Pain: 0 N/A  N/A Debridement Treatment Procedure was tolerated N/A N/A Response: well Post Debridement 2.3x2x0.6 N/A N/A Measurements L x W x D (cm) Post Debridement 2.168 N/A N/A Volume: (cm) Periwound Skin Texture: No Abnormalities Noted N/A N/A Periwound Skin No Abnormalities Noted N/A N/A Moisture: Periwound Skin Color: Erythema: Yes N/A N/A Erythema Location: Circumferential N/A N/A Temperature: No Abnormality N/A N/A Tenderness on Yes N/A N/A Palpation: Wound Preparation: Ulcer Cleansing: N/A N/A Rinsed/Irrigated with Saline Topical Anesthetic Applied: Other: lidocaine 4% Procedures Performed: Debridement N/A N/A Treatment Notes Wound #1 (Left, Lateral Lower Leg) 1. Cleansed with: Clean wound with Normal Saline Cleanse wound with antibacterial soap and water 2. Anesthetic [place]ar[place], [name] ([id number]) Topical Lidocaine 4% cream to wound bed prior to debridement 4. Dressing Applied: Aquacel Ag 5. Secondary Dressing Applied ABD Pad Dry Gauze 7. Secured with Tape Notes kerlix, coban, unna to Ecologistanchor Electronic Signature(s) Signed: 10/07/2016 1:10:50 PM By: Evlyn KannerBritto, Errol MD, FACS Entered By: Evlyn KannerBritto, Errol on 10/07/2016 13:10:50 [place], [name] ([id number]) -------------------------------------------------------------------------------- Multi-Disciplinary Care Plan Details Patient Name: Trevor LackHUGHES, [name] Date of Service: 10/07/2016 12:30 PM Medical Record Number: [id number] Patient Account Number: [id number] Date of Birth/Sex: [birth year](75 y.o. Male) Treating RN: Phillis HaggisPinkerton, Debi Primary Care [place] Wanner: Georgann HousekeeperHUSAIN, KARRAR Other Clinician: Referring Delawrence Fridman: Georgann HousekeeperHUSAIN, KARRAR Treating Kiriana Worthington/Extender: Rudene ReBritto, Errol Weeks in Treatment: 3 Active Inactive ` Abuse / Safety / Falls / Self Care Management Nursing Diagnoses: History of Falls Potential for falls Goals: Patient will remain injury free related to falls Date Initiated: 09/15/2016 Target Resolution Date: 11/26/2016 Goal Status:  Active Interventions: Assess fall risk on admission and as needed Assess impairment of mobility on admission and as needed per policy Notes: ` Nutrition Nursing Diagnoses: Imbalanced nutrition Potential for alteratiion in Nutrition/Potential for imbalanced nutrition Goals: Patient/caregiver agrees to and verbalizes understanding of need to use nutritional supplements and/or vitamins as prescribed Date Initiated: 09/15/2016 Target Resolution Date: 12/31/2016 Goal Status: Active Interventions: Assess patient nutrition upon admission and as needed per policy Notes: ` Orientation to the Wound Care Program [place], [place] ([id number]) Nursing Diagnoses: Knowledge deficit related to the wound healing center program Goals: Patient/caregiver will verbalize understanding of the Wound Healing Center Program Date Initiated: 09/15/2016 Target Resolution Date: 10/01/2016 Goal Status: Active Interventions: Provide education on orientation to the wound center Notes: ` Pain, Acute or Chronic Nursing Diagnoses: Pain, acute or chronic: actual or potential Potential alteration in comfort, pain Goals: Patient/caregiver will verbalize adequate pain control between visits Date Initiated: 09/15/2016 Target Resolution Date: 12/31/2016 Goal Status: Active Interventions: Complete pain assessment as per visit requirements Notes: ` Wound/Skin Impairment Nursing Diagnoses: Impaired tissue integrity Knowledge deficit related to ulceration/compromised skin integrity Goals: Ulcer/skin breakdown will have a volume reduction of 80% by week 12 Date Initiated:  09/15/2016 Target Resolution Date: 12/24/2016 Goal Status: Active Interventions: Assess patient/caregiver ability to perform ulcer/skin care regimen upon admission and as needed Notes: OUMAR, MARCOTT ([id number]) Electronic Signature(s) Signed: 10/10/2016 3:47:26 PM By: Alejandro Mulling Entered By: Alejandro Mulling on 10/07/2016 12:50:42 [place], [name]  ([id number]) -------------------------------------------------------------------------------- Pain Assessment Details Patient Name: [name] Date of Service: 10/07/2016 12:30 PM Medical Record Number: [id number] Patient Account Number: [id number] Date of Birth/Sex: 1940-09-13 (75 y.o. Male) Treating RN: Phillis Haggis Primary Care Henriette Hesser: Georgann Housekeeper Other Clinician: Referring Marvene Strohm: Georgann Housekeeper Treating Korbin Notaro/Extender: Rudene Re in Treatment: 3 Active Problems Location of Pain Severity and Description of Pain Patient Has Paino No Site Locations With Dressing Change: No Pain Management and Medication Current Pain Management: Electronic Signature(s) Signed: 10/10/2016 3:47:26 PM By: Alejandro Mulling Entered By: Alejandro Mulling on 10/07/2016 12:38:30 [place], [name] ([id number]) -------------------------------------------------------------------------------- Patient/Caregiver Education Details Patient Name: [name] Date of Service: 10/07/2016 12:30 PM Medical Record Number: [id number] Patient Account Number: [id number] Date of Birth/Gender: [birth year] (76 y.o. Male) Treating RN: Phillis Haggis Primary Care Physician: Georgann Housekeeper Other Clinician: Referring Physician: Georgann Housekeeper Treating Physician/Extender: Rudene Re in Treatment: 3 Education Assessment Education Provided To: Patient Education Topics Provided Wound/Skin Impairment: Handouts: Other: change dressing as ordered Methods: Demonstration, Explain/Verbal Responses: State content correctly Electronic Signature(s) Signed: 10/10/2016 3:47:26 PM By: Alejandro Mulling Entered By: Alejandro Mulling on 10/07/2016 12:54:40 [place], [name] ([id number]) -------------------------------------------------------------------------------- Wound Assessment Details Patient Name: [name] Date of Service: 10/07/2016 12:30 PM Medical Record Number: [id number] Patient Account Number:  [id number] Date of Birth/Sex: August 17, 1940 (76 y.o. Male) Treating RN: Phillis Haggis Primary Care Amma Crear: Georgann Housekeeper Other Clinician: Referring Melton Walls: Georgann Housekeeper Treating Edelmiro Innocent/Extender: Rudene Re in Treatment: 3 Wound Status Wound Number: 1 Primary Etiology: Trauma, Other Wound Location: Left Lower Leg - Lateral Wound Status: Open Wounding Event: Trauma Comorbid History: Cataracts, Hypertension, Gout Date Acquired: 08/25/2016 Weeks Of Treatment: 3 Clustered Wound: No Photos Photo Uploaded By: Alejandro Mulling on 10/07/2016 13:57:03 Wound Measurements Length: (cm) 2.3 Width: (cm) 2 Depth: (cm) 0.6 Area: (cm) 3.613 Volume: (cm) 2.168 % Reduction in Area: -8.2% % Reduction in Volume: -549.1% Epithelialization: None Tunneling: No Undermining: Yes Starting Position (o'clock): 12 Maximum Distance: (cm) 0.7 Wound Description Classification: Partial Thickness Foul Odor Aft Wound Margin: Distinct, outline attached Slough/Fibrin Exudate Amount: Large Exudate Type: Serosanguineous Exudate Color: red, brown er Cleansing: No o No Wound Bed Granulation Amount: Large (67-100%) [place], [name] ([id number]) Necrotic Amount: Small (1-33%) Necrotic Quality: Adherent Slough Periwound Skin Texture Texture Color No Abnormalities Noted: No No Abnormalities Noted: No Erythema: Yes Moisture Erythema Location: Circumferential No Abnormalities Noted: No Temperature / Pain Temperature: No Abnormality Tenderness on Palpation: Yes Wound Preparation Ulcer Cleansing: Rinsed/Irrigated with Saline Topical Anesthetic Applied: Other: lidocaine 4%, Treatment Notes Wound #1 (Left, Lateral Lower Leg) 1. Cleansed with: Clean wound with Normal Saline Cleanse wound with antibacterial soap and water 2. Anesthetic Topical Lidocaine 4% cream to wound bed prior to debridement 4. Dressing Applied: Aquacel Ag 5. Secondary Dressing Applied ABD Pad Dry Gauze 7. Secured  with Tape Notes kerlix, coban, unna to Ecologist) Signed: 10/10/2016 3:47:26 PM By: Alejandro Mulling Entered By: Alejandro Mulling on 10/07/2016 12:44:32 [place], [name] ([id number]) -------------------------------------------------------------------------------- Vitals Details Patient Name: [name] Date of Service: 10/07/2016 12:30 PM Medical Record Number: [id number] Patient Account Number: [id number] Date of Birth/Sex: Jun 04, 1940 (77 y.o. Male) Treating RN: Phillis Haggis Primary Care Toma Erichsen: Georgann Housekeeper Other Clinician: Referring Beryl Balz: Georgann Housekeeper Treating Naylani Bradner/Extender: Rudene Re in Treatment: 3  Vital Signs Time Taken: 12:38 Temperature (F): 97.7 Height (in): 72 Pulse (bpm): 62 Weight (lbs): 236 Respiratory Rate (breaths/min): 18 Body Mass Index (BMI): 32 Blood Pressure (mmHg): 141/60 Reference Range: 80 - 120 mg / dl Electronic Signature(s) Signed: 10/10/2016 3:47:26 PM By: Alejandro Mulling Entered By: Alejandro Mulling on 10/07/2016 12:41:05

## 2016-10-11 DIAGNOSIS — L03116 Cellulitis of left lower limb: Secondary | ICD-10-CM | POA: Diagnosis not present

## 2016-10-11 DIAGNOSIS — I890 Lymphedema, not elsewhere classified: Secondary | ICD-10-CM | POA: Diagnosis not present

## 2016-10-11 DIAGNOSIS — S81812D Laceration without foreign body, left lower leg, subsequent encounter: Secondary | ICD-10-CM | POA: Diagnosis not present

## 2016-10-12 DIAGNOSIS — Z87891 Personal history of nicotine dependence: Secondary | ICD-10-CM | POA: Diagnosis not present

## 2016-10-12 DIAGNOSIS — Z6832 Body mass index (BMI) 32.0-32.9, adult: Secondary | ICD-10-CM | POA: Diagnosis not present

## 2016-10-12 DIAGNOSIS — I890 Lymphedema, not elsewhere classified: Secondary | ICD-10-CM | POA: Diagnosis not present

## 2016-10-12 DIAGNOSIS — E6609 Other obesity due to excess calories: Secondary | ICD-10-CM | POA: Diagnosis not present

## 2016-10-12 DIAGNOSIS — S81812D Laceration without foreign body, left lower leg, subsequent encounter: Secondary | ICD-10-CM | POA: Diagnosis not present

## 2016-10-12 DIAGNOSIS — M109 Gout, unspecified: Secondary | ICD-10-CM | POA: Diagnosis not present

## 2016-10-12 DIAGNOSIS — I10 Essential (primary) hypertension: Secondary | ICD-10-CM | POA: Diagnosis not present

## 2016-10-12 DIAGNOSIS — Z791 Long term (current) use of non-steroidal anti-inflammatories (NSAID): Secondary | ICD-10-CM | POA: Diagnosis not present

## 2016-10-12 DIAGNOSIS — L03116 Cellulitis of left lower limb: Secondary | ICD-10-CM | POA: Diagnosis not present

## 2016-10-14 ENCOUNTER — Encounter: Payer: PPO | Admitting: Physician Assistant

## 2016-10-14 DIAGNOSIS — S91002A Unspecified open wound, left ankle, initial encounter: Secondary | ICD-10-CM | POA: Diagnosis not present

## 2016-10-14 DIAGNOSIS — S81812A Laceration without foreign body, left lower leg, initial encounter: Secondary | ICD-10-CM | POA: Diagnosis not present

## 2016-10-14 DIAGNOSIS — S81811A Laceration without foreign body, right lower leg, initial encounter: Secondary | ICD-10-CM | POA: Diagnosis not present

## 2016-10-17 DIAGNOSIS — Z6832 Body mass index (BMI) 32.0-32.9, adult: Secondary | ICD-10-CM | POA: Diagnosis not present

## 2016-10-17 DIAGNOSIS — M109 Gout, unspecified: Secondary | ICD-10-CM | POA: Diagnosis not present

## 2016-10-17 DIAGNOSIS — S81812D Laceration without foreign body, left lower leg, subsequent encounter: Secondary | ICD-10-CM | POA: Diagnosis not present

## 2016-10-17 DIAGNOSIS — Z791 Long term (current) use of non-steroidal anti-inflammatories (NSAID): Secondary | ICD-10-CM | POA: Diagnosis not present

## 2016-10-17 DIAGNOSIS — Z87891 Personal history of nicotine dependence: Secondary | ICD-10-CM | POA: Diagnosis not present

## 2016-10-17 DIAGNOSIS — L03116 Cellulitis of left lower limb: Secondary | ICD-10-CM | POA: Diagnosis not present

## 2016-10-17 DIAGNOSIS — E6609 Other obesity due to excess calories: Secondary | ICD-10-CM | POA: Diagnosis not present

## 2016-10-17 DIAGNOSIS — I10 Essential (primary) hypertension: Secondary | ICD-10-CM | POA: Diagnosis not present

## 2016-10-17 DIAGNOSIS — I890 Lymphedema, not elsewhere classified: Secondary | ICD-10-CM | POA: Diagnosis not present

## 2016-10-18 DIAGNOSIS — S81812D Laceration without foreign body, left lower leg, subsequent encounter: Secondary | ICD-10-CM | POA: Diagnosis not present

## 2016-10-18 DIAGNOSIS — L03116 Cellulitis of left lower limb: Secondary | ICD-10-CM | POA: Diagnosis not present

## 2016-10-18 DIAGNOSIS — I890 Lymphedema, not elsewhere classified: Secondary | ICD-10-CM | POA: Diagnosis not present

## 2016-10-18 NOTE — Progress Notes (Signed)
[name], [name] ([id number]) Visit Report for 10/14/2016 Arrival Information Details Patient Name: [name], [name] Date of Service: 10/14/2016 2:30 PM Medical Record Number: [id number] Patient Account Number: [id number] Date of Birth/Sex: Dec 20, 1940 (76 y.o. Male) Treating RN: Phillis Haggis Primary Care Lysha Schrade: Georgann Housekeeper Other Clinician: Referring Simrat Kendrick: Georgann Housekeeper Treating Khaleel Beckom/Extender: Linwood Dibbles, HOYT Weeks in Treatment: 4 Visit Information History Since Last Visit All ordered tests and consults were completed: No Patient Arrived: [name] Added or deleted any medications: No Arrival Time: 14:40 Any new allergies or adverse reactions: No Accompanied By: self Had a fall or experienced change in No Transfer Assistance: None activities of daily living that may affect Patient Identification Verified: Yes risk of falls: Secondary Verification Process Yes Signs or symptoms of abuse/neglect since last No Completed: visito Patient Requires Transmission- No Hospitalized since last visit: No Based Precautions: Has Dressing in Place as Prescribed: Yes Patient Has Alerts: Yes Has Compression in Place as Prescribed: Yes Patient Alerts: L ABI non- Pain Present Now: No compressible R ABI non- compressible Electronic Signature(s) Signed: 10/17/2016 4:29:00 PM By: Alejandro Mulling Entered By: Alejandro Mulling on 10/14/2016 14:40:48 [name], [name] ([id number]) -------------------------------------------------------------------------------- Clinic Level of Care Assessment Details Patient Name: [name] Date of Service: 10/14/2016 2:30 PM Medical Record Number: [id number] Patient Account Number: [id number] Date of Birth/Sex: Sep 10, 1940 (76 y.o. Male) Treating RN: Phillis Haggis Primary Care Naketa Daddario: Georgann Housekeeper Other Clinician: Referring Wilma Michaelson: Georgann Housekeeper Treating Jackey Housey/Extender: Linwood Dibbles, HOYT Weeks in Treatment: 4 Clinic Level of Care Assessment Items TOOL  4 Quantity Score X - Use when only an EandM is performed on FOLLOW-UP visit 1 0 ASSESSMENTS - Nursing Assessment / Reassessment X - Reassessment of Co-morbidities (includes updates in patient status) 1 10 X - Reassessment of Adherence to Treatment Plan 1 5 ASSESSMENTS - Wound and Skin Assessment / Reassessment []  - Simple Wound Assessment / Reassessment - one wound 0 X - Complex Wound Assessment / Reassessment - multiple wounds 2 5 []  - Dermatologic / Skin Assessment (not related to wound area) 0 ASSESSMENTS - Focused Assessment []  - Circumferential Edema Measurements - multi extremities 0 []  - Nutritional Assessment / Counseling / Intervention 0 []  - Lower Extremity Assessment (monofilament, tuning fork, pulses) 0 []  - Peripheral Arterial Disease Assessment (using hand held doppler) 0 ASSESSMENTS - Ostomy and/or Continence Assessment and Care []  - Incontinence Assessment and Management 0 []  - Ostomy Care Assessment and Management (repouching, etc.) 0 PROCESS - Coordination of Care []  - Simple Patient / Family Education for ongoing care 0 X - Complex (extensive) Patient / Family Education for ongoing care 1 20 X - Staff obtains Chiropractor, Records, Test Results / Process Orders 1 10 X - Staff telephones HHA, Nursing Homes / Clarify orders / etc 1 10 []  - Routine Transfer to another Facility (non-emergent condition) 0 [name], [name] ([id number]) []  - Routine Hospital Admission (non-emergent condition) 0 []  - New Admissions / Manufacturing engineer / Ordering NPWT, Apligraf, etc. 0 []  - Emergency Hospital Admission (emergent condition) 0 X - Simple Discharge Coordination 1 10 []  - Complex (extensive) Discharge Coordination 0 PROCESS - Special Needs []  - Pediatric / Minor Patient Management 0 []  - Isolation Patient Management 0 []  - Hearing / Language / Visual special needs 0 []  - Assessment of Community assistance (transportation, D/C planning, etc.) 0 []  - Additional assistance /  Altered mentation 0 []  - Support Surface(s) Assessment (bed, cushion, seat, etc.) 0 INTERVENTIONS - Wound Cleansing / Measurement []  - Simple Wound Cleansing - one wound  0 X - Complex Wound Cleansing - multiple wounds 2 5 []  - Wound Imaging (photographs - any number of wounds) 0 []  - Wound Tracing (instead of photographs) 0 X - Simple Wound Measurement - one wound 1 5 []  - Complex Wound Measurement - multiple wounds 0 INTERVENTIONS - Wound Dressings []  - Small Wound Dressing one or multiple wounds 0 []  - Medium Wound Dressing one or multiple wounds 0 X - Large Wound Dressing one or multiple wounds 2 20 X - Application of Medications - topical 1 5 []  - Application of Medications - injection 0 INTERVENTIONS - Miscellaneous []  - External ear exam 0 [name], [name] ([id number]) []  - Specimen Collection (cultures, biopsies, blood, body fluids, etc.) 0 []  - Specimen(s) / Culture(s) sent or taken to Lab for analysis 0 []  - Patient Transfer (multiple staff / Nurse, adult / Similar devices) 0 []  - Simple Staple / Suture removal (25 or less) 0 []  - Complex Staple / Suture removal (26 or more) 0 []  - Hypo / Hyperglycemic Management (close monitor of Blood Glucose) 0 []  - Ankle / Brachial Index (ABI) - do not check if billed separately 0 X - Vital Signs 1 5 Has the patient been seen at the hospital within the last three years: Yes Total Score: 140 Level Of Care: New/Established - Level 4 Electronic Signature(s) Signed: 10/17/2016 4:29:00 PM By: Alejandro Mulling Entered By: Alejandro Mulling on 10/14/2016 16:53:11 [name], [name] ([id number]) -------------------------------------------------------------------------------- Encounter Discharge Information Details Patient Name: [name] Date of Service: 10/14/2016 2:30 PM Medical Record Number: [id number] Patient Account Number: [id number] Date of Birth/Sex: 1940/11/26 (76 y.o. Male) Treating RN: Phillis Haggis Primary Care Carrington Olazabal: Georgann Housekeeper Other Clinician: Referring Raymundo Rout: Georgann Housekeeper Treating Ayce Pietrzyk/Extender: Linwood Dibbles, HOYT Weeks in Treatment: 4 Encounter Discharge Information Items Discharge Pain Level: 0 Discharge Condition: Stable Ambulatory Status: Cane Discharge Destination: Home Transportation: Private Auto Accompanied By: self Schedule Follow-up Appointment: Yes Medication Reconciliation completed No and provided to Patient/Care Damonique Brunelle: Provided on Clinical Summary of Care: 10/14/2016 Form Type Recipient Paper Patient [facility] Electronic Signature(s) Signed: 10/14/2016 3:26:11 PM By: Gwenlyn Perking Entered By: Gwenlyn Perking on 10/14/2016 15:26:11 Holly, [name] ([id number]) -------------------------------------------------------------------------------- Lower Extremity Assessment Details Patient Name: [name] Date of Service: 10/14/2016 2:30 PM Medical Record Number: [id number] Patient Account Number: [id number] Date of Birth/Sex: [birth year] (76 y.o. Male) Treating RN: Phillis Haggis Primary Care Darlisha Kelm: Georgann Housekeeper Other Clinician: Referring Kilynn Fitzsimmons: Georgann Housekeeper Treating Aiko Belko/Extender: Linwood Dibbles, HOYT Weeks in Treatment: 4 Edema Assessment Assessed: [Left: No] [Right: No] Edema: [Left: Yes] [Right: Yes] Calf Left: Right: Point of Measurement: 35 cm From Medial Instep 40.3 cm 40 cm Ankle Left: Right: Point of Measurement: 13 cm From Medial Instep 27 cm 32.1 cm Vascular Assessment Pulses: Dorsalis Pedis Palpable: [Left:Yes] [Right:Yes] Doppler Audible: [Right:Yes] Posterior Tibial Extremity colors, hair growth, and conditions: Extremity Color: [Left:Normal] [Right:Normal] Temperature of Extremity: [Left:Warm] Capillary Refill: [Left:< 3 seconds] Toe Nail Assessment Left: Right: Thick: No No Discolored: No No Deformed: No No Improper Length and Hygiene: No No Electronic Signature(s) Signed: 10/17/2016 4:29:00 PM By: Alejandro Mulling Entered By: Alejandro Mulling  on 10/14/2016 14:56:25 [place], [name] ([id number]) -------------------------------------------------------------------------------- Multi Wound Chart Details Patient Name: [name] Date of Service: 10/14/2016 2:30 PM Medical Record Number: [id number] Patient Account Number: [id number] Date of Birth/Sex: 1940/05/23 (76 y.o. Male) Treating RN: Phillis Haggis Primary Care Alaster Asfaw: Georgann Housekeeper Other Clinician: Referring Donye Dauenhauer: Georgann Housekeeper Treating Sunaina Ferrando/Extender: STONE III, HOYT Weeks in Treatment: 4 Vital Signs Height(in): 72 Pulse(bpm): 57 Weight(lbs):  236 Blood Pressure 114/57 (mmHg): Body Mass Index(BMI): 32 Temperature(F): 97.5 Respiratory Rate 18 (breaths/min): Photos: [1:No Photos] [2:No Photos] [N/A:N/A] Wound Location: [1:Left Lower Leg - Lateral] [2:Right Lower Leg - Anterior N/A] Wounding Event: [1:Trauma] [2:Trauma] [N/A:N/A] Primary Etiology: [1:Trauma, Other] [2:Venous Leg Ulcer] [N/A:N/A] Secondary Etiology: [1:N/A] [2:Trauma, Other] [N/A:N/A] Comorbid History: [1:Cataracts, Hypertension, Gout] [2:Cataracts, Hypertension, N/A Gout] Date Acquired: [1:08/25/2016] [2:10/10/2016] [N/A:N/A] Weeks of Treatment: [1:4] [2:0] [N/A:N/A] Wound Status: [1:Open] [2:Open] [N/A:N/A] Measurements L x W x D 2.7x1.8x0.7 [2:4.8x0.6x0.1] [N/A:N/A] (cm) Area (cm) : [1:3.817] [2:2.262] [N/A:N/A] Volume (cm) : [1:2.672] [2:0.226] [N/A:N/A] % Reduction in Area: [1:-14.30%] [2:N/A] [N/A:N/A] % Reduction in Volume: -700.00% [2:N/A] [N/A:N/A] Classification: [1:Partial Thickness] [2:Partial Thickness] [N/A:N/A] Exudate Amount: [1:Large] [2:Large] [N/A:N/A] Exudate Type: [1:Serosanguineous] [2:Serosanguineous] [N/A:N/A] Exudate Color: [1:red, brown] [2:red, brown] [N/A:N/A] Wound Margin: [1:Distinct, outline attached] [2:Distinct, outline attached] [N/A:N/A] Granulation Amount: [1:Large (67-100%)] [2:None Present (0%)] [N/A:N/A] Necrotic Amount: [1:Small (1-33%)] [2:Large  (67-100%)] [N/A:N/A] Epithelialization: [1:None] [2:None] [N/A:N/A] Periwound Skin Texture: No Abnormalities Noted [2:No Abnormalities Noted] [N/A:N/A] Periwound Skin [1:No Abnormalities Noted] [2:No Abnormalities Noted] [N/A:N/A] Moisture: Periwound Skin Color: Erythema: Yes [2:Erythema: Yes] [N/A:N/A] Erythema Location: [1:Circumferential] [2:Circumferential] [N/A:N/A] Temperature: No Abnormality No Abnormality N/A Tenderness on Yes Yes N/A Palpation: Wound Preparation: Ulcer Cleansing: Ulcer Cleansing: N/A Rinsed/Irrigated with Rinsed/Irrigated with Saline Saline Topical Anesthetic Topical Anesthetic Applied: Other: lidocaine Applied: Other: lidocaine 4% 4% Treatment Notes Electronic Signature(s) Signed: 10/17/2016 4:29:00 PM By: Alejandro Mulling Entered By: Alejandro Mulling on 10/14/2016 15:07:46 [place], [name] ([id number]) -------------------------------------------------------------------------------- Multi-Disciplinary Care Plan Details Patient Name: [name] Date of Service: 10/14/2016 2:30 PM Medical Record Number: [id number] Patient Account Number: [id number] Date of Birth/Sex: 1941/04/20 (76 y.o. Male) Treating RN: Phillis Haggis Primary Care Sydelle Sherfield: Georgann Housekeeper Other Clinician: Referring Marcea Rojek: Georgann Housekeeper Treating Nikolay Demetriou/Extender: Linwood Dibbles, HOYT Weeks in Treatment: 4 Active Inactive ` Abuse / Safety / Falls / Self Care Management Nursing Diagnoses: History of Falls Potential for falls Goals: Patient will remain injury free related to falls Date Initiated: 09/15/2016 Target Resolution Date: 11/26/2016 Goal Status: Active Interventions: Assess fall risk on admission and as needed Assess impairment of mobility on admission and as needed per policy Notes: ` Nutrition Nursing Diagnoses: Imbalanced nutrition Potential for alteratiion in Nutrition/Potential for imbalanced nutrition Goals: Patient/caregiver agrees to and verbalizes understanding  of need to use nutritional supplements and/or vitamins as prescribed Date Initiated: 09/15/2016 Target Resolution Date: 12/31/2016 Goal Status: Active Interventions: Assess patient nutrition upon admission and as needed per policy Notes: ` Orientation to the Wound Care Program [place], [place] ([id number]) Nursing Diagnoses: Knowledge deficit related to the wound healing center program Goals: Patient/caregiver will verbalize understanding of the Wound Healing Center Program Date Initiated: 09/15/2016 Target Resolution Date: 10/01/2016 Goal Status: Active Interventions: Provide education on orientation to the wound center Notes: ` Pain, Acute or Chronic Nursing Diagnoses: Pain, acute or chronic: actual or potential Potential alteration in comfort, pain Goals: Patient/caregiver will verbalize adequate pain control between visits Date Initiated: 09/15/2016 Target Resolution Date: 12/31/2016 Goal Status: Active Interventions: Complete pain assessment as per visit requirements Notes: ` Wound/Skin Impairment Nursing Diagnoses: Impaired tissue integrity Knowledge deficit related to ulceration/compromised skin integrity Goals: Ulcer/skin breakdown will have a volume reduction of 80% by week 12 Date Initiated: 09/15/2016 Target Resolution Date: 12/24/2016 Goal Status: Active Interventions: Assess patient/caregiver ability to perform ulcer/skin care regimen upon admission and as needed Notes: DANYELL, AWBREY ([id number]) Electronic Signature(s) Signed: 10/17/2016 4:29:00 PM By: Alejandro Mulling Entered By: Alejandro Mulling on 10/14/2016 15:07:37 Greggs, [name] ([id number]) --------------------------------------------------------------------------------  Pain Assessment Details Patient Name: [name], [name] Date of Service: 10/14/2016 2:30 PM Medical Record Number: [id number] Patient Account Number: [id number] Date of Birth/Sex: 07/26/1940 (76 y.o. Male) Treating RN: Phillis Haggis Primary Care  Morganna Styles: Georgann Housekeeper Other Clinician: Referring Tonyetta Berko: Georgann Housekeeper Treating Madyn Ivins/Extender: Linwood Dibbles, HOYT Weeks in Treatment: 4 Active Problems Location of Pain Severity and Description of Pain Patient Has Paino No Site Locations With Dressing Change: No Pain Management and Medication Current Pain Management: Electronic Signature(s) Signed: 10/17/2016 4:29:00 PM By: Alejandro Mulling Entered By: Alejandro Mulling on 10/14/2016 14:40:54 [place], [name] ([id number]) -------------------------------------------------------------------------------- Patient/Caregiver Education Details Patient Name: [name] Date of Service: 10/14/2016 2:30 PM Medical Record Number: [id number] Patient Account Number: [id number] Date of Birth/Gender: Jul 03, 1940 (76 y.o. Male) Treating RN: Phillis Haggis Primary Care Physician: Georgann Housekeeper Other Clinician: Referring Physician: Georgann Housekeeper Treating Physician/Extender: Skeet Simmer in Treatment: 4 Education Assessment Education Provided To: Patient Education Topics Provided Wound/Skin Impairment: Handouts: Other: change dressing as ordered Methods: Demonstration, Explain/Verbal Responses: State content correctly Electronic Signature(s) Signed: 10/17/2016 4:29:00 PM By: Alejandro Mulling Entered By: Alejandro Mulling on 10/14/2016 14:59:11 [place], [name] ([id number]) -------------------------------------------------------------------------------- Wound Assessment Details Patient Name: [name] Date of Service: 10/14/2016 2:30 PM Medical Record Number: [id number] Patient Account Number: [id number] Date of Birth/Sex: 24-Oct-1940 (76 y.o. Male) Treating RN: Phillis Haggis Primary Care Jyaire Koudelka: Georgann Housekeeper Other Clinician: Referring Gedalia Mcmillon: Georgann Housekeeper Treating Brad Mcgaughy/Extender: Linwood Dibbles, HOYT Weeks in Treatment: 4 Wound Status Wound Number: 1 Primary Etiology: Trauma, Other Wound Location: Left Lower Leg -  Lateral Wound Status: Open Wounding Event: Trauma Comorbid History: Cataracts, Hypertension, Gout Date Acquired: 08/25/2016 Weeks Of Treatment: 4 Clustered Wound: No Photos Photo Uploaded By: Alejandro Mulling on 10/14/2016 16:49:39 Wound Measurements Length: (cm) 2.7 Width: (cm) 1.8 Depth: (cm) 0.7 Area: (cm) 3.817 Volume: (cm) 2.672 % Reduction in Area: -14.3% % Reduction in Volume: -700% Epithelialization: None Tunneling: No Undermining: No Wound Description Classification: Partial Thickness Foul Odor Afte Wound Margin: Distinct, outline attached Slough/Fibrino Exudate Amount: Large Exudate Type: Serosanguineous Exudate Color: red, brown r Cleansing: No Yes Wound Bed Granulation Amount: Large (67-100%) Necrotic Amount: Small (1-33%) Necrotic Quality: Adherent Slough Periwound Skin Texture Halberg, [name] ([id number]) Texture Color No Abnormalities Noted: No No Abnormalities Noted: No Erythema: Yes Moisture Erythema Location: Circumferential No Abnormalities Noted: No Temperature / Pain Temperature: No Abnormality Tenderness on Palpation: Yes Wound Preparation Ulcer Cleansing: Rinsed/Irrigated with Saline Topical Anesthetic Applied: Other: lidocaine 4%, Treatment Notes Wound #1 (Left, Lateral Lower Leg) 1. Cleansed with: Clean wound with Normal Saline Cleanse wound with antibacterial soap and water 2. Anesthetic Topical Lidocaine 4% cream to wound bed prior to debridement 4. Dressing Applied: Aquacel Ag 5. Secondary Dressing Applied ABD Pad Dry Gauze 7. Secured with Tape Notes kerlix, Theatre manager) Signed: 10/17/2016 4:29:00 PM By: Alejandro Mulling Entered By: Alejandro Mulling on 10/14/2016 14:54:00 [place], [name] ([id number]) -------------------------------------------------------------------------------- Wound Assessment Details Patient Name: [name] Date of Service: 10/14/2016 2:30 PM Medical Record Number: [id number] Patient  Account Number: [id number] Date of Birth/Sex: July 01, 1940 (76 y.o. Male) Treating RN: Phillis Haggis Primary Care Bird Swetz: Georgann Housekeeper Other Clinician: Referring Jude Linck: Georgann Housekeeper Treating Clemence Stillings/Extender: Linwood Dibbles, HOYT Weeks in Treatment: 4 Wound Status Wound Number: 2 Primary Etiology: Venous Leg Ulcer Wound Location: Right Lower Leg - Anterior Secondary Etiology: Trauma, Other Wounding Event: Trauma Wound Status: Open Date Acquired: 10/10/2016 Comorbid History: Cataracts, Hypertension, Gout Weeks Of Treatment: 0 Clustered Wound: No Photos Photo Uploaded By: Alejandro Mulling on 10/14/2016 16:49:40 Wound Measurements  Length: (cm) 4.8 Width: (cm) 0.6 Depth: (cm) 0.1 Area: (cm) 2.262 Volume: (cm) 0.226 % Reduction in Area: % Reduction in Volume: Epithelialization: None Tunneling: No Undermining: No Wound Description Classification: Partial Thickness Foul Odor Afte Wound Margin: Distinct, outline attached Slough/Fibrino Exudate Amount: Large Exudate Type: Serosanguineous Exudate Color: red, brown r Cleansing: No Yes Wound Bed Granulation Amount: None Present (0%) Necrotic Amount: Large (67-100%) Necrotic Quality: Adherent Slough Periwound Skin Texture [place]udtson, [name] ([id number]) Texture Color No Abnormalities Noted: No No Abnormalities Noted: No Erythema: Yes Moisture Erythema Location: Circumferential No Abnormalities Noted: No Temperature / Pain Temperature: No Abnormality Tenderness on Palpation: Yes Wound Preparation Ulcer Cleansing: Rinsed/Irrigated with Saline Topical Anesthetic Applied: Other: lidocaine 4%, Treatment Notes Wound #2 (Right, Anterior Lower Leg) 1. Cleansed with: Clean wound with Normal Saline Cleanse wound with antibacterial soap and water 2. Anesthetic Topical Lidocaine 4% cream to wound bed prior to debridement 4. Dressing Applied: Aquacel Ag 5. Secondary Dressing Applied ABD Pad Dry Gauze 7. Secured  with Tape Notes kerlix, coban, drawtex Electronic Signature(s) Signed: 10/17/2016 4:29:00 PM By: Alejandro MullingPinkerton, Debra Entered By: Alejandro MullingPinkerton, Debra on 10/14/2016 14:51:43 ChiltonHUGHES, [name] ([id number]) -------------------------------------------------------------------------------- Vitals Details Patient Name: Trevor LackHUGHES, [name] Date of Service: 10/14/2016 2:30 PM Medical Record Number: [id number] Patient Account Number: [id number] Date of Birth/Sex: 04-30-1940 [age] y.o. Male) Treating RN: Phillis HaggisPinkerton, Debi Primary Care Dalis Beers: Georgann HousekeeperHUSAIN, [name] Other Clinician: Referring Keelia Graybill: Georgann HousekeeperHUSAIN, [name] Treating Valita Righter/Extender: Linwood DibblesSTONE III, HOYT Weeks in Treatment: 4 Vital Signs Time Taken: 14:42 Temperature (F): 97.5 Height (in): 72 Pulse (bpm): 57 Weight (lbs): 236 Respiratory Rate (breaths/min): 18 Body Mass Index (BMI): 32 Blood Pressure (mmHg): 114/57 Reference Range: 80 - 120 mg / dl Electronic Signature(s) Signed: 10/17/2016 4:29:00 PM By: Alejandro MullingPinkerton, Debra Entered By: Alejandro MullingPinkerton, Debra on 10/14/2016 14:42:22

## 2016-10-18 NOTE — Progress Notes (Signed)
KAO, CONRY ([id number]) Visit Report for 10/14/2016 Chief Complaint Document Details Patient Name: [name], [name] Date of Service: 10/14/2016 2:30 PM Medical Record Number: [id number] Patient Account Number: [id number] Date of Birth/Sex: [birth year] (76 y.o. Male) Treating RN: Phillis Haggis Primary Care Provider: Georgann Housekeeper Other Clinician: Referring Provider: Georgann Housekeeper Treating Provider/Extender: Linwood Dibbles, Daily Crate Weeks in Treatment: 4 Information Obtained from: Patient Chief Complaint Patient seen for complaints of Non-Healing Wound left lower lateral leg Electronic Signature(s) Signed: 10/14/2016 5:31:15 PM By: Lenda Kelp PA-C Entered By: Lenda Kelp on 10/14/2016 15:14:46 [place], [name] ([id number]) -------------------------------------------------------------------------------- HPI Details Patient Name: [name] Date of Service: 10/14/2016 2:30 PM Medical Record Number: [id number] Patient Account Number: [id number] Date of Birth/Sex: [birth year] (76 y.o. Male) Treating RN: Phillis Haggis Primary Care Provider: Georgann Housekeeper Other Clinician: Referring Provider: Georgann Housekeeper Treating Provider/Extender: Linwood Dibbles, Skilynn Durney Weeks in Treatment: 4 History of Present Illness Location: left lower lateral leg Quality: Patient reports experiencing a sharp pain to affected area(s). Severity: Patient states wound are getting worse. Duration: Patient has had the wound for < 4 weeks prior to presenting for treatment Timing: Pain in wound is Intermittent (comes and goes Context: The wound occurred when the patient tripped and had a fall and injured his left leg Modifying Factors: Other treatment(s) tried include:had gone to an urgent care couple of times a day taken an x-ray and was put on antibiotics Associated Signs and Symptoms: Patient reports having increase swelling and redness to the leg HPI Description: 76 year old gentleman who had a injury to the left ankle and foot  3 weeks ago was seen recently in the ER after he had swelling and redness of the left ankle. He is also known to have a flareup of gout recently. After he was examined in the urgent care he was placed on Keflex and Bactrim and referred to the wound center. A recent x-ray of the left ankle did not show any fractures or abnormality. The patient doesn't take very good care of his health has never been worked up for chronic lymphedema which she's had and sees a physician about once a year. He has not been aware that he has bilateral lower extremity lymphedema. he does not smoke. He has never had a workup for his lymphedema. 09/29/2016 -- the patient's insurance will not cover the snap vacuum system and he is finding it difficult to get home health as his copayment is $25 each time. He will not be able to afford the [employer] wound VAC either. 10/14/16 patient presents today for fault evaluation concerning his left lateral ankle wound. Unfortunately he has a skin tear which occurred on Monday of this week over the interior shin of his right lower extremity. Fortunately he did we approximate the flap as well as she could and I think this has done fairly well. Nonetheless he continues to have some discomfort at this site which is completely understandable. He is concerned about how well this is going to heal hopefully being that he did catch it early this will heal up quickly. He did question how long this would take I explained that I cannot give him an exact time but hopefully it will not be too long especially since the undermining seems to be filling in rapidly. Electronic Signature(s) Signed: 10/14/2016 5:31:15 PM By: Lenda Kelp PA-C Entered By: Lenda Kelp on 10/14/2016 15:17:11 [place], [name] ([id number]) -------------------------------------------------------------------------------- Physical Exam Details Patient Name: [name] Date of Service: 10/14/2016 2:30 PM Medical Record Number:  [id number] Patient Account Number: [id number] Date of Birth/Sex: [birth year] (76 y.o. Male) Treating RN: Phillis Haggis Primary Care Provider: Georgann Housekeeper Other Clinician: Referring Provider: Georgann Housekeeper Treating Provider/Extender: Linwood Dibbles, Ema Hebner Weeks in Treatment: 4 Constitutional Obese and well-hydrated in no acute distress. Respiratory normal breathing without difficulty. clear to auscultation bilaterally. Cardiovascular regular rate and rhythm with normal S1, S2. 3+ pitting edema of the right lower extremity. The left LE is improved and trace. Psychiatric this patient is able to make decisions and demonstrates good insight into disease process. Alert and Oriented x 3. pleasant and cooperative. Notes Patient's left lateral ankle wound shows decreased undermining compared to last week's evaluation. He also has a fairly good granulation bed. The new skin tear on the right anterior lower extremity appears to be fairly well approximated and there is no evidence of significant necrotic slough. He does have some discomfort however. Electronic Signature(s) Signed: 10/14/2016 5:31:15 PM By: Lenda Kelp PA-C Entered By: Lenda Kelp on 10/14/2016 15:20:49 [place], [name] ([id number]) -------------------------------------------------------------------------------- Physician Orders Details Patient Name: [name] Date of Service: 10/14/2016 2:30 PM Medical Record Number: [id number] Patient Account Number: [id number] Date of Birth/Sex: 1940/11/22 (76 y.o. Male) Treating RN: Phillis Haggis Primary Care Provider: Georgann Housekeeper Other Clinician: Referring Provider: Georgann Housekeeper Treating Provider/Extender: Linwood Dibbles, Shakerria Parran Weeks in Treatment: 4 Verbal / Phone Orders: Yes ClinicianAshok Cordia, Debi Read Back and Verified: Yes Diagnosis Coding ICD-10 Coding Code Description [phone] Laceration without foreign body, left lower leg, initial encounter [phone] Other soft tissue  disorders related to use, overuse and pressure, left lower leg I89.0 Lymphedema, not elsewhere classified L03.116 Cellulitis of left lower limb L97.322 Non-pressure chronic ulcer of left ankle with fat layer exposed Wound Cleansing Wound #1 Left,Lateral Lower Leg o Clean wound with Normal Saline. o Cleanse wound with mild soap and water Wound #2 Right,Anterior Lower Leg o Clean wound with Normal Saline. o Cleanse wound with mild soap and water Anesthetic Wound #1 Left,Lateral Lower Leg o Topical Lidocaine 4% cream applied to wound bed prior to debridement - for clinic use Wound #2 Right,Anterior Lower Leg o Topical Lidocaine 4% cream applied to wound bed prior to debridement - for clinic use Skin Barriers/Peri-Wound Care Wound #1 Left,Lateral Lower Leg o Moisturizing lotion - not on wound Wound #2 Right,Anterior Lower Leg o Moisturizing lotion - not on wound Primary Wound Dressing Wound #1 Left,Lateral Lower Leg o Aquacel Ag - rope, pack into undermining lightly [place], [name] ([id number]) Wound #2 Right,Anterior Lower Leg o Aquacel Ag Secondary Dressing Wound #1 Left,Lateral Lower Leg o ABD pad o Dry Gauze Wound #2 Right,Anterior Lower Leg o ABD pad o Dry Gauze o Drawtex Dressing Change Frequency Wound #1 Left,Lateral Lower Leg o Three times weekly Wound #2 Right,Anterior Lower Leg o Three times weekly Follow-up Appointments Wound #1 Left,Lateral Lower Leg o Return Appointment in 1 week. Wound #2 Right,Anterior Lower Leg o Return Appointment in 1 week. Edema Control Wound #1 Left,Lateral Lower Leg o Kerlix and Coban - Left Lower Extremity - 3cm from toes to 3 cm from knee unna to anchor o Elevate legs to the level of the heart and pump ankles as often as possible Wound #2 Right,Anterior Lower Leg o Kerlix and Coban - Left Lower Extremity - 3cm from toes to 3 cm from knee unna to anchor o Elevate legs to the level of the  heart and pump ankles as often as possible Additional Orders / Instructions Wound #2 Right,Anterior Lower Leg o Increase protein  intake. o Other: - Make an appointment to see your primary care physician. Home Health Wound #1 Left,Lateral Lower Leg o Continue Home Health Visits [place], [place] ([id number]) o Home Health Nurse may visit PRN to address patientos wound care needs. o FACE TO FACE ENCOUNTER: MEDICARE and MEDICAID PATIENTS: I certify that this patient is under my care and that I had a face-to-face encounter that meets the physician face-to-face encounter requirements with this patient on this date. The encounter with the patient was in whole or in part for the following MEDICAL CONDITION: (primary reason for Home Healthcare) MEDICAL NECESSITY: I certify, that based on my findings, NURSING services are a medically necessary home health service. HOME BOUND STATUS: I certify that my clinical findings support that this patient is homebound (i.e., Due to illness or injury, pt requires aid of supportive devices such as crutches, cane, wheelchairs, walkers, the use of special transportation or the assistance of another person to leave their place of residence. There is a normal inability to leave the home and doing so requires considerable and taxing effort. Other absences are for medical reasons / religious services and are infrequent or of short duration when for other reasons). o If current dressing causes regression in wound condition, may D/C ordered dressing product/s and apply Normal Saline Moist Dressing daily until next Wound Healing Center / Other MD appointment. Notify Wound Healing Center of regression in wound condition at [phone]. o Please direct any NON-WOUND related issues/requests for orders to patient's Primary Care Physician Wound #2 Right,Anterior Lower Leg o Continue Home Health Visits o Home Health Nurse may visit PRN to address patientos wound  care needs. o FACE TO FACE ENCOUNTER: MEDICARE and MEDICAID PATIENTS: I certify that this patient is under my care and that I had a face-to-face encounter that meets the physician face-to-face encounter requirements with this patient on this date. The encounter with the patient was in whole or in part for the following MEDICAL CONDITION: (primary reason for Home Healthcare) MEDICAL NECESSITY: I certify, that based on my findings, NURSING services are a medically necessary home health service. HOME BOUND STATUS: I certify that my clinical findings support that this patient is homebound (i.e., Due to illness or injury, pt requires aid of supportive devices such as crutches, cane, wheelchairs, walkers, the use of special transportation or the assistance of another person to leave their place of residence. There is a normal inability to leave the home and doing so requires considerable and taxing effort. Other absences are for medical reasons / religious services and are infrequent or of short duration when for other reasons). o If current dressing causes regression in wound condition, may D/C ordered dressing product/s and apply Normal Saline Moist Dressing daily until next Wound Healing Center / Other MD appointment. Notify Wound Healing Center of regression in wound condition at [phone]. o Please direct any NON-WOUND related issues/requests for orders to patient's Primary Care Physician Electronic Signature(s) Signed: 10/14/2016 5:31:15 PM By: Lenda Kelp PA-C Signed: 10/17/2016 4:29:00 PM By: Alejandro Mulling Entered By: Alejandro Mulling on 10/14/2016 15:22:18 [place], [name] ([id number]) -------------------------------------------------------------------------------- Problem List Details Patient Name: [name] Date of Service: 10/14/2016 2:30 PM Medical Record Number: [id number] Patient Account Number: [id number] Date of Birth/Sex: 02-13-1941 (76 y.o. Male) Treating RN:  Phillis Haggis Primary Care Provider: Georgann Housekeeper Other Clinician: Referring Provider: Georgann Housekeeper Treating Provider/Extender: Linwood Dibbles, Kaitrin Seybold Weeks in Treatment: 4 Active Problems ICD-10 Encounter Code Description Active Date Diagnosis S81.812A Laceration without foreign body, left  lower leg, initial 09/15/2016 Yes encounter M70.862 Other soft tissue disorders related to use, overuse and 09/15/2016 Yes pressure, left lower leg I89.0 Lymphedema, not elsewhere classified 09/15/2016 Yes L03.116 Cellulitis of left lower limb 09/15/2016 Yes 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 Inactive Problems Resolved Problems Electronic Signature(s) Signed: 10/14/2016 5:31:15 PM By: Lenda Kelp PA-C Entered By: Lenda Kelp on 10/14/2016 15:14:17 [place], [name] ([id number]) -------------------------------------------------------------------------------- Progress Note Details Patient Name: [name] Date of Service: 10/14/2016 2:30 PM Medical Record Number: [id number] Patient Account Number: [id number] Date of Birth/Sex: May 01, 1940 (76 y.o. Male) Treating RN: Phillis Haggis Primary Care Provider: Georgann Housekeeper Other Clinician: Referring Provider: Georgann Housekeeper Treating Provider/Extender: Linwood Dibbles, Raneem Mendolia Weeks in Treatment: 4 Subjective Chief Complaint Information obtained from Patient Patient seen for complaints of Non-Healing Wound left lower lateral leg History of Present Illness (HPI) The following HPI elements were documented for the patient's wound: Location: left lower lateral leg Quality: Patient reports experiencing a sharp pain to affected area(s). Severity: Patient states wound are getting worse. Duration: Patient has had the wound for < 4 weeks prior to presenting for treatment Timing: Pain in wound is Intermittent (comes and goes Context: The wound occurred when the patient tripped and had a fall and injured his left  leg Modifying Factors: Other treatment(s) tried include:had gone to an urgent care couple of times a day taken an x-ray and was put on antibiotics Associated Signs and Symptoms: Patient reports having increase swelling and redness to the leg 76 year old gentleman who had a injury to the left ankle and foot 3 weeks ago was seen recently in the ER after he had swelling and redness of the left ankle. He is also known to have a flareup of gout recently. After he was examined in the urgent care he was placed on Keflex and Bactrim and referred to the wound center. A recent x-ray of the left ankle did not show any fractures or abnormality. The patient doesn't take very good care of his health has never been worked up for chronic lymphedema which she's had and sees a physician about once a year. He has not been aware that he has bilateral lower extremity lymphedema. he does not smoke. He has never had a workup for his lymphedema. 09/29/2016 -- the patient's insurance will not cover the snap vacuum system and he is finding it difficult to get home health as his copayment is $25 each time. He will not be able to afford the [employer] wound VAC either. 10/14/16 patient presents today for fault evaluation concerning his left lateral ankle wound. Unfortunately he has a skin tear which occurred on Monday of this week over the interior shin of his right lower extremity. Fortunately he did we approximate the flap as well as she could and I think this has done fairly well. Nonetheless he continues to have some discomfort at this site which is completely understandable. He is concerned about how well this is going to heal hopefully being that he did catch it early this will heal up quickly. He did question how long this would take I explained that I cannot give him an exact time but hopefully it will not be too long especially since the undermining seems to be filling in rapidly. [place], [name]  ([id number]) Objective Constitutional Obese and well-hydrated in no acute distress. Vitals Time Taken: 2:42 PM, Height: 72 in, Weight: 236 lbs, BMI: 32, Temperature: 97.5 F, Pulse: 57 bpm, Respiratory Rate: 18  breaths/min, Blood Pressure: 114/57 mmHg. Respiratory normal breathing without difficulty. clear to auscultation bilaterally. Cardiovascular regular rate and rhythm with normal S1, S2. 3+ pitting edema of the right lower extremity. The left LE is improved and trace. Psychiatric this patient is able to make decisions and demonstrates good insight into disease process. Alert and Oriented x 3. pleasant and cooperative. General Notes: Patient's left lateral ankle wound shows decreased undermining compared to last week's evaluation. He also has a fairly good granulation bed. The new skin tear on the right anterior lower extremity appears to be fairly well approximated and there is no evidence of significant necrotic slough. He does have some discomfort however. Integumentary (Hair, Skin) Wound #1 status is Open. Original cause of wound was Trauma. The wound is located on the Left,Lateral Lower Leg. The wound measures 2.7cm length x 1.8cm width x 0.7cm depth; 3.817cm^2 area and 2.672cm^3 volume. There is no tunneling or undermining noted. There is a large amount of serosanguineous drainage noted. The wound margin is distinct with the outline attached to the wound base. There is large (67-100%) granulation within the wound bed. There is a small (1-33%) amount of necrotic tissue within the wound bed including Adherent Slough. The periwound skin appearance exhibited: Erythema. The surrounding wound skin color is noted with erythema which is circumferential. Periwound temperature was noted as No Abnormality. The periwound has tenderness on palpation. Wound #2 status is Open. Original cause of wound was Trauma. The wound is located on the Right,Anterior Lower Leg. The wound measures 4.8cm  length x 0.6cm width x 0.1cm depth; 2.262cm^2 area and 0.226cm^3 volume. There is no tunneling or undermining noted. There is a large amount of serosanguineous drainage noted. The wound margin is distinct with the outline attached to the wound base. There is no granulation within the wound bed. There is a large (67-100%) amount of necrotic tissue within the wound bed including Adherent Slough. The periwound skin appearance exhibited: Erythema. The surrounding wound skin color is noted with erythema which is circumferential. Periwound temperature was noted as No Abnormality. The periwound has tenderness on palpation. [place], [name] ([id number]) Assessment Active Problems ICD-10 [phone] - Laceration without foreign body, left lower leg, initial encounter [phone] - Other soft tissue disorders related to use, overuse and pressure, left lower leg I89.0 - Lymphedema, not elsewhere classified L03.116 - Cellulitis of left lower limb L97.322 - Non-pressure chronic ulcer of left ankle with fat layer exposed Plan Wound Cleansing: Wound #1 Left,Lateral Lower Leg: Clean wound with Normal Saline. Cleanse wound with mild soap and water Wound #2 Right,Anterior Lower Leg: Clean wound with Normal Saline. Cleanse wound with mild soap and water Anesthetic: Wound #1 Left,Lateral Lower Leg: Topical Lidocaine 4% cream applied to wound bed prior to debridement - for clinic use Wound #2 Right,Anterior Lower Leg: Topical Lidocaine 4% cream applied to wound bed prior to debridement - for clinic use Skin Barriers/Peri-Wound Care: Wound #1 Left,Lateral Lower Leg: Moisturizing lotion - not on wound Wound #2 Right,Anterior Lower Leg: Moisturizing lotion - not on wound Primary Wound Dressing: Wound #1 Left,Lateral Lower Leg: Aquacel Ag - rope, pack into undermining lightly Wound #2 Right,Anterior Lower Leg: Aquacel Ag Secondary Dressing: Wound #1 Left,Lateral Lower Leg: ABD pad Dry Gauze Wound #2  Right,Anterior Lower Leg: ABD pad Dry Gauze Drawtex [place], [name] ([id number]) Dressing Change Frequency: Wound #1 Left,Lateral Lower Leg: Three times weekly Wound #2 Right,Anterior Lower Leg: Three times weekly Follow-up Appointments: Wound #1 Left,Lateral Lower Leg: Return Appointment in 1 week.  Wound #2 Right,Anterior Lower Leg: Return Appointment in 1 week. Edema Control: Wound #1 Left,Lateral Lower Leg: Kerlix and Coban - Left Lower Extremity - 3cm from toes to 3 cm from knee unna to anchor Elevate legs to the level of the heart and pump ankles as often as possible Wound #2 Right,Anterior Lower Leg: Kerlix and Coban - Left Lower Extremity - 3cm from toes to 3 cm from knee unna to anchor Elevate legs to the level of the heart and pump ankles as often as possible Additional Orders / Instructions: Wound #2 Right,Anterior Lower Leg: Increase protein intake. Other: - Make an appointment to see your primary care physician. Home Health: Wound #1 Left,Lateral Lower Leg: Continue Home Health Visits Home Health Nurse may visit PRN to address patient s wound care needs. FACE TO FACE ENCOUNTER: MEDICARE and MEDICAID PATIENTS: I certify that this patient is under my care and that I had a face-to-face encounter that meets the physician face-to-face encounter requirements with this patient on this date. The encounter with the patient was in whole or in part for the following MEDICAL CONDITION: (primary reason for Home Healthcare) MEDICAL NECESSITY: I certify, that based on my findings, NURSING services are a medically necessary home health service. HOME BOUND STATUS: I certify that my clinical findings support that this patient is homebound (i.e., Due to illness or injury, pt requires aid of supportive devices such as crutches, cane, wheelchairs, walkers, the use of special transportation or the assistance of another person to leave their place of residence. There is a normal inability to  leave the home and doing so requires considerable and taxing effort. Other absences are for medical reasons / religious services and are infrequent or of short duration when for other reasons). If current dressing causes regression in wound condition, may D/C ordered dressing product/s and apply Normal Saline Moist Dressing daily until next Wound Healing Center / Other MD appointment. Notify Wound Healing Center of regression in wound condition at [phone]. Please direct any NON-WOUND related issues/requests for orders to patient's Primary Care Physician Wound #2 Right,Anterior Lower Leg: Continue Home Health Visits Home Health Nurse may visit PRN to address patient s wound care needs. FACE TO FACE ENCOUNTER: MEDICARE and MEDICAID PATIENTS: I certify that this patient is under my care and that I had a face-to-face encounter that meets the physician face-to-face encounter requirements with this patient on this date. The encounter with the patient was in whole or in part for the following MEDICAL CONDITION: (primary reason for Home Healthcare) MEDICAL NECESSITY: I certify, that based on my findings, NURSING services are a medically necessary home health service. HOME BOUND STATUS: I certify that my clinical findings support that this patient is homebound (i.e., Due to illness or injury, pt requires aid of supportive devices such as crutches, cane, wheelchairs, walkers, the use of special transportation or the assistance of another person to leave their place of residence. There is a normal inability to leave the home and doing so requires considerable and taxing effort. Other absences are [place], [name] ([id number]) for medical reasons / religious services and are infrequent or of short duration when for other reasons). If current dressing causes regression in wound condition, may D/C ordered dressing product/s and apply Normal Saline Moist Dressing daily until next Wound Healing Center / Other  MD appointment. Notify Wound Healing Center of regression in wound condition at [phone]. Please direct any NON-WOUND related issues/requests for orders to patient's Primary Care Physician I am going to  recommend that we continue with the Current wound care measures for the left lateral ankle wound and we will begin the silver alginate dressing for the right anterior shin as well as performing Kerlex in Coban wraps for this wound as well. We will see were things stand in one week. If anything worsens in the interim will contact our office for additional recommendations. Electronic Signature(s) Signed: 10/14/2016 5:31:15 PM By: Lenda KelpStone III, Marlys Stegmaier PA-C Entered By: Lenda KelpStone III, Bristyn Kulesza on 10/14/2016 16:54:42 [place], [name] ([id number]) -------------------------------------------------------------------------------- SuperBill Details Patient Name: Trevor LackHUGHES, [name] Date of Service: 10/14/2016 Medical Record Number: [id number] Patient Account Number: [id number] Date of Birth/Sex: 14-Nov-1940 [age] y.o. Male) Treating RN: Phillis HaggisPinkerton, Debi Primary Care Provider: Georgann HousekeeperHUSAIN, KARRAR Other Clinician: Referring Provider: Georgann HousekeeperHUSAIN, KARRAR Treating Provider/Extender: Linwood DibblesSTONE III, Mikele Sifuentes Weeks in Treatment: 4 Diagnosis Coding ICD-10 Codes Code Description [phone] [id number] Laceration without foreign body, left lower leg, initial encounter M70.862 Other soft tissue disorders related to use, overuse and pressure, left lower leg I89.0 Lymphedema, not elsewhere classified L03.116 Cellulitis of left lower limb L97.322 Non-pressure chronic ulcer of left ankle with fat layer exposed Facility Procedures CPT4 Code: [id number] Description: 99214 - WOUND CARE VISIT-LEV 4 EST PT Modifier: Quantity: 1 Physician Procedures CPT4: Description Modifier Quantity Code [id number] 99213 - WC PHYS LEVEL 3 - EST PT 1 ICD-10 Description Diagnosis S81.812A Laceration without foreign body, left lower leg, initial encounter M70.862 Other soft tissue disorders  related to use, overuse and  pressure, left lower leg I89.0 Lymphedema, not elsewhere classified L97.322 Non-pressure chronic ulcer of left ankle with fat layer exposed Electronic Signature(s) Signed: 10/14/2016 5:31:15 PM By: Lenda KelpStone III, Roshan Salamon PA-C Signed: 10/17/2016 4:29:00 PM By: Alejandro MullingPinkerton, Debra Entered By: Alejandro MullingPinkerton, Debra on 10/14/2016 16:53:19

## 2016-10-19 DIAGNOSIS — E6609 Other obesity due to excess calories: Secondary | ICD-10-CM | POA: Diagnosis not present

## 2016-10-19 DIAGNOSIS — Z6832 Body mass index (BMI) 32.0-32.9, adult: Secondary | ICD-10-CM | POA: Diagnosis not present

## 2016-10-19 DIAGNOSIS — Z791 Long term (current) use of non-steroidal anti-inflammatories (NSAID): Secondary | ICD-10-CM | POA: Diagnosis not present

## 2016-10-19 DIAGNOSIS — L03116 Cellulitis of left lower limb: Secondary | ICD-10-CM | POA: Diagnosis not present

## 2016-10-19 DIAGNOSIS — I890 Lymphedema, not elsewhere classified: Secondary | ICD-10-CM | POA: Diagnosis not present

## 2016-10-19 DIAGNOSIS — Z87891 Personal history of nicotine dependence: Secondary | ICD-10-CM | POA: Diagnosis not present

## 2016-10-19 DIAGNOSIS — M109 Gout, unspecified: Secondary | ICD-10-CM | POA: Diagnosis not present

## 2016-10-19 DIAGNOSIS — I10 Essential (primary) hypertension: Secondary | ICD-10-CM | POA: Diagnosis not present

## 2016-10-19 DIAGNOSIS — S81812D Laceration without foreign body, left lower leg, subsequent encounter: Secondary | ICD-10-CM | POA: Diagnosis not present

## 2016-10-21 ENCOUNTER — Encounter: Payer: PPO | Admitting: Surgery

## 2016-10-21 DIAGNOSIS — S81811A Laceration without foreign body, right lower leg, initial encounter: Secondary | ICD-10-CM | POA: Diagnosis not present

## 2016-10-21 DIAGNOSIS — S91002A Unspecified open wound, left ankle, initial encounter: Secondary | ICD-10-CM | POA: Diagnosis not present

## 2016-10-21 DIAGNOSIS — S81812A Laceration without foreign body, left lower leg, initial encounter: Secondary | ICD-10-CM | POA: Diagnosis not present

## 2016-10-23 DIAGNOSIS — Z6832 Body mass index (BMI) 32.0-32.9, adult: Secondary | ICD-10-CM | POA: Diagnosis not present

## 2016-10-23 DIAGNOSIS — E6609 Other obesity due to excess calories: Secondary | ICD-10-CM | POA: Diagnosis not present

## 2016-10-23 DIAGNOSIS — I890 Lymphedema, not elsewhere classified: Secondary | ICD-10-CM | POA: Diagnosis not present

## 2016-10-23 DIAGNOSIS — L03116 Cellulitis of left lower limb: Secondary | ICD-10-CM | POA: Diagnosis not present

## 2016-10-23 DIAGNOSIS — S81812D Laceration without foreign body, left lower leg, subsequent encounter: Secondary | ICD-10-CM | POA: Diagnosis not present

## 2016-10-23 DIAGNOSIS — M109 Gout, unspecified: Secondary | ICD-10-CM | POA: Diagnosis not present

## 2016-10-23 DIAGNOSIS — I10 Essential (primary) hypertension: Secondary | ICD-10-CM | POA: Diagnosis not present

## 2016-10-23 DIAGNOSIS — Z791 Long term (current) use of non-steroidal anti-inflammatories (NSAID): Secondary | ICD-10-CM | POA: Diagnosis not present

## 2016-10-23 DIAGNOSIS — Z87891 Personal history of nicotine dependence: Secondary | ICD-10-CM | POA: Diagnosis not present

## 2016-10-24 DIAGNOSIS — M109 Gout, unspecified: Secondary | ICD-10-CM | POA: Diagnosis not present

## 2016-10-24 DIAGNOSIS — S81812D Laceration without foreign body, left lower leg, subsequent encounter: Secondary | ICD-10-CM | POA: Diagnosis not present

## 2016-10-24 DIAGNOSIS — L03116 Cellulitis of left lower limb: Secondary | ICD-10-CM | POA: Diagnosis not present

## 2016-10-24 DIAGNOSIS — Z6832 Body mass index (BMI) 32.0-32.9, adult: Secondary | ICD-10-CM | POA: Diagnosis not present

## 2016-10-24 DIAGNOSIS — E6609 Other obesity due to excess calories: Secondary | ICD-10-CM | POA: Diagnosis not present

## 2016-10-24 DIAGNOSIS — Z791 Long term (current) use of non-steroidal anti-inflammatories (NSAID): Secondary | ICD-10-CM | POA: Diagnosis not present

## 2016-10-24 DIAGNOSIS — I10 Essential (primary) hypertension: Secondary | ICD-10-CM | POA: Diagnosis not present

## 2016-10-24 DIAGNOSIS — I890 Lymphedema, not elsewhere classified: Secondary | ICD-10-CM | POA: Diagnosis not present

## 2016-10-24 DIAGNOSIS — Z87891 Personal history of nicotine dependence: Secondary | ICD-10-CM | POA: Diagnosis not present

## 2016-10-24 NOTE — Progress Notes (Signed)
Trevor, Greene ([id number]) Visit Report for 10/21/2016 Chief Complaint Document Details Patient Name: [name], [name] Date of Service: 10/21/2016 2:30 PM Medical Record Number: [id number] Patient Account Number: [id number] Date of Birth/Sex: [birth year] (76 y.o. Male) Treating RN: Phillis Haggis Primary Care Provider: Georgann Housekeeper Other Clinician: Referring Provider: Georgann Housekeeper Treating Provider/Extender: Rudene Re in Treatment: 5 Information Obtained from: Patient Chief Complaint Patient seen for complaints of Non-Healing Wound left lower lateral leg Electronic Signature(s) Signed: 10/21/2016 2:56:17 PM By: Evlyn Kanner MD, FACS Entered By: Evlyn Kanner on 10/21/2016 14:56:17 [place]rigan, [name] ([id number]) -------------------------------------------------------------------------------- HPI Details Patient Name: [name] Date of Service: 10/21/2016 2:30 PM Medical Record Number: [id number] Patient Account Number: [id number] Date of Birth/Sex: July 08, 1940 (76 y.o. Male) Treating RN: Phillis Haggis Primary Care Provider: Georgann Housekeeper Other Clinician: Referring Provider: Georgann Housekeeper Treating Provider/Extender: Rudene Re in Treatment: 5 History of Present Illness Location: left lower lateral leg Quality: Patient reports experiencing a sharp pain to affected area(s). Severity: Patient states wound are getting worse. Duration: Patient has had the wound for < 4 weeks prior to presenting for treatment Timing: Pain in wound is Intermittent (comes and goes Context: The wound occurred when the patient tripped and had a fall and injured his left leg Modifying Factors: Other treatment(s) tried include:had gone to an urgent care couple of times a day taken an x-ray and was put on antibiotics Associated Signs and Symptoms: Patient reports having increase swelling and redness to the leg HPI Description: 76 year old gentleman who had a injury to the left ankle and foot 3  weeks ago was seen recently in the ER after he had swelling and redness of the left ankle. He is also known to have a flareup of gout recently. After he was examined in the urgent care he was placed on Keflex and Bactrim and referred to the wound center. A recent x-ray of the left ankle did not show any fractures or abnormality. The patient doesn't take very good care of his health has never been worked up for chronic lymphedema which she's had and sees a physician about once a year. He has not been aware that he has bilateral lower extremity lymphedema. he does not smoke. He has never had a workup for his lymphedema. 09/29/2016 -- the patient's insurance will not cover the snap vacuum system and he is finding it difficult to get home health as his copayment is $25 each time. He will not be able to afford the [employer] wound VAC either. 10/14/16 patient presents today for fault evaluation concerning his left lateral ankle wound. Unfortunately he has a skin tear which occurred on Monday of this week over the interior shin of his right lower extremity. Fortunately he did we approximate the flap as well as she could and I think this has done fairly well. Nonetheless he continues to have some discomfort at this site which is completely understandable. He is concerned about how well this is going to heal hopefully being that he did catch it early this will heal up quickly. He did question how long this would take I explained that I cannot give him an exact time but hopefully it will not be too long especially since the undermining seems to be filling in rapidly. 10/21/2016 -- besides the fall and the lacerated wound on his right lateral calf the rest of his health has been going okay and I have reviewed his wounds and made appropriate changes Electronic Signature(s) Signed: 10/21/2016 3:11:31 PM By: Evlyn Kanner MD,  FACS Entered By: Evlyn Kanner on 10/21/2016 15:11:30 ELVYN, KROHN  ([id number]) -------------------------------------------------------------------------------- Physical Exam Details Patient Name: Trevor, Greene Date of Service: 10/21/2016 2:30 PM Medical Record Number: [id number] Patient Account Number: [id number] Date of Birth/Sex: Aug 07, 1940 (76 y.o. Male) Treating RN: Phillis Haggis Primary Care Provider: Georgann Housekeeper Other Clinician: Referring Provider: Georgann Housekeeper Treating Provider/Extender: Rudene Re in Treatment: 5 Constitutional . Pulse regular. Respirations normal and unlabored. Afebrile. . Eyes Nonicteric. Reactive to light. Ears, Nose, Mouth, and Throat Lips, teeth, and gums WNL.Marland Kitchen Moist mucosa without lesions. Neck supple and nontender. No palpable supraclavicular or cervical adenopathy. Normal sized without goiter. Respiratory WNL. No retractions.. Breath sounds WNL, No rubs, rales, rhonchi, or wheeze.. Cardiovascular Heart rhythm and rate regular, no murmur or gallop.. Pedal Pulses WNL. No clubbing, cyanosis or edema. Chest Breasts symmetical and no nipple discharge.. Breast tissue WNL, no masses, lumps, or tenderness.. Lymphatic No adneopathy. No adenopathy. No adenopathy. Musculoskeletal Adexa without tenderness or enlargement.. Digits and nails w/o clubbing, cyanosis, infection, petechiae, ischemia, or inflammatory conditions.. Integumentary (Hair, Skin) No suspicious lesions. No crepitus or fluctuance. No peri-wound warmth or erythema. No masses.Marland Kitchen Psychiatric Judgement and insight Intact.. No evidence of depression, anxiety, or agitation.. Notes the left lateral lower extremity wound continues to be clean and no debridement was required. We will continue local care as before. The right lateral calf wound has been prepped appropriately and Steri-Stripped in place to try and bring it a little closer and hopefully hasten the healing process Electronic Signature(s) Signed: 10/21/2016 3:12:26 PM By: Evlyn Kanner MD,  FACS Entered By: Evlyn Kanner on 10/21/2016 15:12:26 [place], [name] ([id number]) -------------------------------------------------------------------------------- Physician Orders Details Patient Name: [name] Date of Service: 10/21/2016 2:30 PM Medical Record Number: [id number] Patient Account Number: [id number] Date of Birth/Sex: [birth year] (76 y.o. Male) Treating RN: Phillis Haggis Primary Care Provider: Georgann Housekeeper Other Clinician: Referring Provider: Georgann Housekeeper Treating Provider/Extender: Rudene Re in Treatment: 5 Verbal / Phone Orders: Yes Clinician: Pinkerton, Debi Read Back and Verified: Yes Diagnosis Coding Wound Cleansing Wound #1 Left,Lateral Lower Leg o Clean wound with Normal Saline. o Cleanse wound with mild soap and water Wound #2 Right,Anterior Lower Leg o Clean wound with Normal Saline. o Cleanse wound with mild soap and water Anesthetic Wound #1 Left,Lateral Lower Leg o Topical Lidocaine 4% cream applied to wound bed prior to debridement - for clinic use Wound #2 Right,Anterior Lower Leg o Topical Lidocaine 4% cream applied to wound bed prior to debridement - for clinic use Skin Barriers/Peri-Wound Care Wound #1 Left,Lateral Lower Leg o Moisturizing lotion - not on wound Wound #2 Right,Anterior Lower Leg o Moisturizing lotion - not on wound Primary Wound Dressing Wound #1 Left,Lateral Lower Leg o Aquacel Ag - rope, pack into undermining lightly Wound #2 Right,Anterior Lower Leg o Other: - steri-strips (DO NOT REMOVE) Secondary Dressing Wound #1 Left,Lateral Lower Leg o ABD pad o Dry Gauze Hale, [name] ([id number]) Wound #2 Right,Anterior Lower Leg o ABD pad o Dry Gauze o XtraSorb Dressing Change Frequency Wound #1 Left,Lateral Lower Leg o Three times weekly Wound #2 Right,Anterior Lower Leg o Other: - ********DO NOT CHANGE UNLESS IT HAS DRAINED THROUGH WRAP OR GETS LOOSE******** Follow-up  Appointments Wound #1 Left,Lateral Lower Leg o Return Appointment in 1 week. Wound #2 Right,Anterior Lower Leg o Return Appointment in 1 week. Edema Control Wound #1 Left,Lateral Lower Leg o Kerlix and Coban - Left Lower Extremity - 3cm from toes to 3 cm from knee unna to anchor o Elevate  legs to the level of the heart and pump ankles as often as possible Wound #2 Right,Anterior Lower Leg o Kerlix and Coban - Left Lower Extremity - 3cm from toes to 3 cm from knee unna to anchor o Elevate legs to the level of the heart and pump ankles as often as possible Additional Orders / Instructions Wound #1 Left,Lateral Lower Leg o Increase protein intake. Wound #2 Right,Anterior Lower Leg o Increase protein intake. Home Health Wound #1 Left,Lateral Lower Leg o Continue Home Health Visits - DO NOT CHANGE OR UNWRAP THE RIGHT LEG UNLESS IT HAS DRAINED THROUGH WRAP OR GETS LOOSE o Home Health Nurse may visit PRN to address patientos wound care needs. o FACE TO FACE ENCOUNTER: MEDICARE and MEDICAID PATIENTS: I certify that this patient is under my care and that I had a face-to-face encounter that meets the physician face-to-face encounter requirements with this patient on this date. The encounter with the patient was in [place], [place] ([id number]) whole or in part for the following MEDICAL CONDITION: (primary reason for Home Healthcare) MEDICAL NECESSITY: I certify, that based on my findings, NURSING services are a medically necessary home health service. HOME BOUND STATUS: I certify that my clinical findings support that this patient is homebound (i.e., Due to illness or injury, pt requires aid of supportive devices such as crutches, cane, wheelchairs, walkers, the use of special transportation or the assistance of another person to leave their place of residence. There is a normal inability to leave the home and doing so requires considerable and taxing effort. Other absences  are for medical reasons / religious services and are infrequent or of short duration when for other reasons). o If current dressing causes regression in wound condition, may D/C ordered dressing product/s and apply Normal Saline Moist Dressing daily until next Wound Healing Center / Other MD appointment. Notify Wound Healing Center of regression in wound condition at [phone]. o Please direct any NON-WOUND related issues/requests for orders to patient's Primary Care Physician Wound #2 Right,Anterior Lower Leg o Continue Home Health Visits - DO NOT CHANGE OR UNWRAP THE RIGHT LEG UNLESS IT HAS DRAINED THROUGH WRAP OR GETS LOOSE o Home Health Nurse may visit PRN to address patientos wound care needs. o FACE TO FACE ENCOUNTER: MEDICARE and MEDICAID PATIENTS: I certify that this patient is under my care and that I had a face-to-face encounter that meets the physician face-to-face encounter requirements with this patient on this date. The encounter with the patient was in whole or in part for the following MEDICAL CONDITION: (primary reason for Home Healthcare) MEDICAL NECESSITY: I certify, that based on my findings, NURSING services are a medically necessary home health service. HOME BOUND STATUS: I certify that my clinical findings support that this patient is homebound (i.e., Due to illness or injury, pt requires aid of supportive devices such as crutches, cane, wheelchairs, walkers, the use of special transportation or the assistance of another person to leave their place of residence. There is a normal inability to leave the home and doing so requires considerable and taxing effort. Other absences are for medical reasons / religious services and are infrequent or of short duration when for other reasons). o If current dressing causes regression in wound condition, may D/C ordered dressing product/s and apply Normal Saline Moist Dressing daily until next Wound Healing Center /  Other MD appointment. Notify Wound Healing Center of regression in wound condition at [phone]. o Please direct any NON-WOUND related issues/requests for orders to  patient's Primary Care Physician Electronic Signature(s) Signed: 10/21/2016 4:14:17 PM By: Evlyn Kanner MD, FACS Signed: 10/21/2016 4:49:46 PM By: Alejandro Mulling Entered By: Alejandro Mulling on 10/21/2016 14:52:10 [place], [name] ([id number]) -------------------------------------------------------------------------------- Problem List Details Patient Name: [name] Date of Service: 10/21/2016 2:30 PM Medical Record Number: [id number] Patient Account Number: [id number] Date of Birth/Sex: [birth year] (76 y.o. 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 Male) Treating RN:  Phillis Haggis Primary Care Provider: Georgann Housekeeper Other Clinician: Referring Provider: Georgann Housekeeper Treating Provider/Extender: Rudene Re in Treatment: 5 Subjective Chief Complaint Information obtained from Patient Patient seen for complaints of Non-Healing Wound left lower lateral leg History of Present Illness (HPI) The following HPI elements were documented for the patient's wound: Location: left lower lateral leg Quality: Patient reports experiencing a sharp pain to affected area(s). Severity: Patient states wound are getting worse. Duration: Patient has had the wound for < 4 weeks prior to presenting for treatment Timing: Pain in wound is Intermittent (comes and goes Context: The wound occurred when the patient tripped and had a fall and injured his left leg Modifying Factors: Other treatment(s) tried include:had gone to an urgent care couple of times a day taken an x-ray and was put on antibiotics Associated Signs and Symptoms: Patient reports having increase swelling and redness to the leg 76 year old gentleman who had a injury to the left ankle and foot 3 weeks ago was seen recently in the ER after he had swelling and redness of the left ankle. He is also known to have a flareup of gout recently. After he was examined in the urgent care he was placed on Keflex and Bactrim and referred to the wound center. A recent x-ray of the left ankle did not show any fractures or abnormality. The patient doesn't take very good care of his health has never been worked up for chronic lymphedema which she's had and sees a physician about once a year. He has not been aware that he has bilateral lower extremity lymphedema. he does not smoke. He has never had a workup for his lymphedema. 09/29/2016 -- the patient's insurance will not cover the snap vacuum system and he is finding it difficult to get home health as his copayment is $25 each time. He will not be able to afford the  [employer] wound VAC either. 10/14/16 patient presents today for fault evaluation concerning his left lateral ankle wound. Unfortunately he has a skin tear which occurred on Monday of this week over the interior shin of his right lower extremity. Fortunately he did we approximate the flap as well as she could and I think this has done fairly  well. Nonetheless he continues to have some discomfort at this site which is completely understandable. He is concerned about how well this is going to heal hopefully being that he did catch it early this will heal up quickly. He did question how long this would take I explained that I cannot give him an exact time but hopefully it will not be too long especially since the undermining seems to be filling in rapidly. 10/21/2016 -- besides the fall and the lacerated wound on his right lateral calf the rest of his health has Thul, [name] ([id number]) been going okay and I have reviewed his wounds and made appropriate changes Objective Constitutional Pulse regular. Respirations normal and unlabored. Afebrile. Vitals Time Taken: 2:19 PM, Height: 72 in, Weight: 236 lbs, BMI: 32, Temperature: 98.0 F, Pulse: 75 bpm, Respiratory Rate: 18 breaths/min, Blood Pressure: 170/74 mmHg. General Notes: Made Dr. Meyer Russel aware of pts BP. Eyes Nonicteric. Reactive to light. Ears, Nose, Mouth, and Throat Lips, teeth, and gums WNL.Marland Kitchen Moist mucosa without lesions. Neck supple and nontender. No palpable supraclavicular or cervical adenopathy. Normal sized without goiter. Respiratory WNL. No retractions.. Breath sounds WNL, No rubs, rales, rhonchi, or wheeze.. Cardiovascular Heart rhythm and rate regular, no murmur or gallop.. Pedal Pulses WNL. No clubbing, cyanosis or edema. Chest Breasts symmetical and no nipple discharge.. Breast tissue WNL, no masses, lumps, or tenderness.. Lymphatic No adneopathy. No adenopathy. No adenopathy. Musculoskeletal Adexa without tenderness or  enlargement.. Digits and nails w/o clubbing, cyanosis, infection, petechiae, ischemia, or inflammatory conditions.Marland Kitchen Psychiatric Judgement and insight Intact.. No evidence of depression, anxiety, or agitation.. General Notes: the left lateral lower extremity wound continues to be clean and no debridement was [place], [name] ([id number]) required. We will continue local care as before. The right lateral calf wound has been prepped appropriately and Steri-Stripped in place to try and bring it a little closer and hopefully hasten the healing process Integumentary (Hair, Skin) No suspicious lesions. No crepitus or fluctuance. No peri-wound warmth or erythema. No masses.. Wound #1 status is Open. Original cause of wound was Trauma. The wound is located on the Left,Lateral Lower Leg. The wound measures 2.8cm length x 1.8cm width x 0.4cm depth; 3.958cm^2 area and 1.583cm^3 volume. There is no tunneling noted, however, there is undermining starting at 12:00 and ending at :00 with a maximum distance of 0.7cm. There is a large amount of serosanguineous drainage noted. The wound margin is distinct with the outline attached to the wound base. There is medium (34-66%) granulation within the wound bed. There is a medium (34-66%) amount of necrotic tissue within the wound bed including Adherent Slough. The periwound skin appearance exhibited: Erythema. The surrounding wound skin color is noted with erythema which is circumferential. Periwound temperature was noted as No Abnormality. The periwound has tenderness on palpation. Wound #2 status is Open. Original cause of wound was Trauma. The wound is located on the Right,Anterior Lower Leg. The wound measures 5cm length x 0.4cm width x 0.1cm depth; 1.571cm^2 area and 0.157cm^3 volume. There is no tunneling or undermining noted. There is a large amount of serosanguineous drainage noted. The wound margin is distinct with the outline attached to the wound base. There  is medium (34-66%) granulation within the wound bed. There is a medium (34-66%) amount of necrotic tissue within the wound bed including Adherent Slough. The periwound skin appearance exhibited: Erythema. The surrounding wound skin color is noted with erythema which is circumferential. Periwound temperature was noted as No Abnormality. The periwound has tenderness  on palpation. 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 [place], [name] ([id number]) Cleanse wound with mild soap and water Wound #2 Right,Anterior Lower Leg: Clean wound with Normal Saline. Cleanse wound with mild soap and water Anesthetic: Wound #1 Left,Lateral Lower Leg: Topical Lidocaine 4% cream applied to wound bed prior to debridement - for clinic use Wound #2 Right,Anterior Lower Leg: Topical Lidocaine 4% cream applied to wound bed prior to debridement - for clinic use Skin Barriers/Peri-Wound Care: Wound #1 Left,Lateral Lower Leg: Moisturizing lotion - not on wound Wound #2 Right,Anterior Lower Leg: Moisturizing lotion - not on wound Primary Wound Dressing: Wound #1 Left,Lateral Lower Leg: Aquacel Ag - rope, pack into undermining lightly Wound #2 Right,Anterior Lower Leg: Other: - steri-strips (DO NOT REMOVE) Secondary Dressing: Wound #1 Left,Lateral Lower Leg: ABD pad Dry Gauze Wound #2 Right,Anterior Lower Leg: ABD pad Dry Gauze XtraSorb Dressing Change Frequency: Wound #1 Left,Lateral Lower Leg: Three times weekly Wound #2  Right,Anterior Lower Leg: Other: - ********DO NOT CHANGE UNLESS IT HAS DRAINED THROUGH WRAP OR GETS LOOSE******** Follow-up Appointments: Wound #1 Left,Lateral Lower Leg: Return Appointment in 1 week. Wound #2 Right,Anterior Lower Leg: Return Appointment in 1 week. Edema Control: Wound #1 Left,Lateral Lower Leg: Kerlix and Coban - Left Lower Extremity - 3cm from toes to 3 cm from knee unna to anchor Elevate legs to the level of the heart and pump ankles as often as possible Wound #2 Right,Anterior Lower Leg: Kerlix and Coban - Left Lower Extremity - 3cm from toes to 3 cm from knee unna to anchor Elevate legs to the level of the heart and pump ankles as often as possible Additional Orders / Instructions: Wound #1 Left,Lateral Lower Leg: Increase protein intake. Wound #2 Right,Anterior Lower Leg: Increase protein intake. Home Health: [facility], [facility] ([id number]) Wound #1 Left,Lateral Lower Leg: Continue Home Health Visits - DO NOT CHANGE OR UNWRAP THE RIGHT LEG UNLESS IT HAS DRAINED THROUGH WRAP OR GETS LOOSE Home Health Nurse may visit PRN to address patient s wound care needs. FACE TO FACE ENCOUNTER: MEDICARE and MEDICAID PATIENTS: I certify that this patient is under my care and that I had a face-to-face encounter that meets the physician face-to-face encounter requirements with this patient on this date. The encounter with the patient was in whole or in part for the following MEDICAL CONDITION: (primary reason for Home Healthcare) MEDICAL NECESSITY: I certify, that based on my findings, NURSING services are a medically necessary home health service. HOME BOUND STATUS: I certify that my clinical findings support that this patient is homebound (i.e., Due to illness or injury, pt requires aid of supportive devices such as crutches, cane, wheelchairs, walkers, the use of special transportation or the assistance of another person to leave their place of residence. There is a normal  inability to leave the home and doing so requires considerable and taxing effort. Other absences are for medical reasons / religious services and are infrequent or of short duration when for other reasons). If current dressing causes regression in wound condition, may D/C ordered dressing product/s and apply Normal  Saline Moist Dressing daily until next Wound Healing Center / Other MD appointment. Notify Wound Healing Center of regression in wound condition at [phone]. Please direct any NON-WOUND related issues/requests for orders to patient's Primary Care Physician Wound #2 Right,Anterior Lower Leg: Continue Home Health Visits - DO NOT CHANGE OR UNWRAP THE RIGHT LEG UNLESS IT HAS DRAINED THROUGH WRAP OR GETS LOOSE Home Health Nurse may visit PRN to address patient s wound care needs. FACE TO FACE ENCOUNTER: MEDICARE and MEDICAID PATIENTS: I certify that this patient is under my care and that I had a face-to-face encounter that meets the physician face-to-face encounter requirements with this patient on this date. The encounter with the patient was in whole or in part for the following MEDICAL CONDITION: (primary reason for Home Healthcare) MEDICAL NECESSITY: I certify, that based on my findings, NURSING services are a medically necessary home health service. HOME BOUND STATUS: I certify that my clinical findings support that this patient is homebound (i.e., Due to illness or injury, pt requires aid of supportive devices such as crutches, cane, wheelchairs, walkers, the use of special transportation or the assistance of another person to leave their place of residence. There is a normal inability to leave the home and doing so requires considerable and taxing effort. Other absences are for medical reasons / religious services and are infrequent or of short duration when for other reasons). If current dressing causes regression in wound condition, may D/C ordered dressing product/s and  apply Normal Saline Moist Dressing daily until next Wound Healing Center / Other MD appointment. Notify Wound Healing Center of regression in wound condition at [phone]. Please direct any NON-WOUND related issues/requests for orders to patient's Primary Care Physician after a thorough review today, I have recommended: 1. Packing the left lower extremity wound with silver alginate rope and applying a light Kerlix and Coban dressing. 2. the right lower extremity wound has been Steri-Stripped in place and we will apply a bolster and a Kerlix and Coban dressing which will be left intact for the week 3. Elevation and exercise. 4. we will try and get his home health order some compression stockings for him the 20- 30 mm variety Bessire, [name] ([id number]) 5. Regular visits to the wound center Electronic Signature(s) Signed: 10/21/2016 3:14:42 PM By: Evlyn Kanner MD, FACS Entered By: Evlyn Kanner on 10/21/2016 15:14:42 Yale, [name] ([id number]) -------------------------------------------------------------------------------- SuperBill Details Patient Name: [name] Date of Service: 10/21/2016 Medical Record Number: [id number] Patient Account Number: [id number] Date of Birth/Sex: 03-Jun-1940 (75 y.o. Male) Treating RN: Phillis Haggis Primary Care Provider: Georgann Housekeeper Other Clinician: Referring Provider: Georgann Housekeeper Treating Provider/Extender: Rudene Re in Treatment: 5 Diagnosis Coding ICD-10 Codes Code Description [phone] Laceration without foreign body, left lower leg, initial encounter [id number] Other soft tissue disorders related to use, overuse and pressure, left lower leg I89.0 Lymphedema, not elsewhere classified L03.116 Cellulitis of left lower limb L97.322 Non-pressure chronic ulcer of left ankle with fat layer exposed S81.811A Laceration without foreign body, right lower leg, initial encounter Facility Procedures CPT4 Code: [id number] Description: 99214 -  WOUND CARE VISIT-LEV 4 EST PT Modifier: Quantity: 1 Physician Procedures CPT4 Code Description: [id number] - WC PHYS LEVEL 3 - EST PT ICD-10 Description Diagnosis S81.812A Laceration without foreign body, left lower leg, ini I89.0 Lymphedema, not elsewhere classified S81.811A Laceration without foreign body, right lower  leg, in L97.322 Non-pressure chronic ulcer of left ankle with fat la Modifier: tial encounter itial encounte yer exposed Quantity: 1 r Electronic Signature(s)  Signed: 10/21/2016 4:49:46 PM By: Alejandro MullingPinkerton, Debra Previous Signature: 10/21/2016 3:15:17 PM Version By: Evlyn KannerBritto, Elenna Spratling MD, FACS Entered By: Alejandro MullingPinkerton, Debra on 10/21/2016 16:26:09

## 2016-10-24 NOTE — Progress Notes (Signed)
[name], [name] ([id number]) Visit Report for 10/21/2016 Arrival Information Details Patient Name: [name], [name] Date of Service: 10/21/2016 2:30 PM Medical Record Number: [id number] Patient Account Number: [id number] Date of Birth/Sex: 28-Aug-1940 (76 y.o. Male) Treating RN: Phillis Haggis Primary Care Brecken Walth: Georgann Housekeeper Other Clinician: Referring Hanna Aultman: Georgann Housekeeper Treating Dajsha Massaro/Extender: Rudene Re in Treatment: 5 Visit Information History Since Last Visit All ordered tests and consults were completed: No Patient Arrived: [name] Added or deleted any medications: No Arrival Time: 14:16 Any new allergies or adverse reactions: No Accompanied By: self Had a fall or experienced change in No Transfer Assistance: None activities of daily living that may affect Patient Identification Verified: Yes risk of falls: Secondary Verification Process Yes Signs or symptoms of abuse/neglect since last No Completed: visito Patient Requires Transmission- No Hospitalized since last visit: No Based Precautions: Has Dressing in Place as Prescribed: Yes Patient Has Alerts: Yes Has Compression in Place as Prescribed: Yes Patient Alerts: L ABI non- Pain Present Now: No compressible R ABI non- compressible Electronic Signature(s) Signed: 10/21/2016 4:49:46 PM By: Alejandro Mulling Entered By: Alejandro Mulling on 10/21/2016 14:18:41 Belitz, [name] ([id number]) -------------------------------------------------------------------------------- Clinic Level of Care Assessment Details Patient Name: [name] Date of Service: 10/21/2016 2:30 PM Medical Record Number: [id number] Patient Account Number: [id number] Date of Birth/Sex: [birth year] (76 y.o. Male) Treating RN: Phillis Haggis Primary Care Demerius Podolak: Georgann Housekeeper Other Clinician: Referring Tabor Bartram: Georgann Housekeeper Treating Aedan Geimer/Extender: Rudene Re in Treatment: 5 Clinic Level of Care Assessment Items TOOL 4  Quantity Score X - Use when only an EandM is performed on FOLLOW-UP visit 1 0 ASSESSMENTS - Nursing Assessment / Reassessment X - Reassessment of Co-morbidities (includes updates in patient status) 1 10 X - Reassessment of Adherence to Treatment Plan 1 5 ASSESSMENTS - Wound and Skin Assessment / Reassessment []  - Simple Wound Assessment / Reassessment - one wound 0 X - Complex Wound Assessment / Reassessment - multiple wounds 2 5 []  - Dermatologic / Skin Assessment (not related to wound area) 0 ASSESSMENTS - Focused Assessment []  - Circumferential Edema Measurements - multi extremities 0 []  - Nutritional Assessment / Counseling / Intervention 0 []  - Lower Extremity Assessment (monofilament, tuning fork, pulses) 0 []  - Peripheral Arterial Disease Assessment (using hand held doppler) 0 ASSESSMENTS - Ostomy and/or Continence Assessment and Care []  - Incontinence Assessment and Management 0 []  - Ostomy Care Assessment and Management (repouching, etc.) 0 PROCESS - Coordination of Care []  - Simple Patient / Family Education for ongoing care 0 X - Complex (extensive) Patient / Family Education for ongoing care 1 20 X - Staff obtains Chiropractor, Records, Test Results / Process Orders 1 10 X - Staff telephones HHA, Nursing Homes / Clarify orders / etc 1 10 []  - Routine Transfer to another Facility (non-emergent condition) 0 Vankleeck, [name] ([id number]) []  - Routine Hospital Admission (non-emergent condition) 0 []  - New Admissions / Manufacturing engineer / Ordering NPWT, Apligraf, etc. 0 []  - Emergency Hospital Admission (emergent condition) 0 X - Simple Discharge Coordination 1 10 []  - Complex (extensive) Discharge Coordination 0 PROCESS - Special Needs []  - Pediatric / Minor Patient Management 0 []  - Isolation Patient Management 0 []  - Hearing / Language / Visual special needs 0 []  - Assessment of Community assistance (transportation, D/C planning, etc.) 0 []  - Additional assistance / Altered  mentation 0 []  - Support Surface(s) Assessment (bed, cushion, seat, etc.) 0 INTERVENTIONS - Wound Cleansing / Measurement []  - Simple Wound Cleansing - one wound 0 X -  Complex Wound Cleansing - multiple wounds 2 5 X - Wound Imaging (photographs - any number of wounds) 1 5 []  - Wound Tracing (instead of photographs) 0 []  - Simple Wound Measurement - one wound 0 X - Complex Wound Measurement - multiple wounds 2 5 INTERVENTIONS - Wound Dressings []  - Small Wound Dressing one or multiple wounds 0 []  - Medium Wound Dressing one or multiple wounds 0 X - Large Wound Dressing one or multiple wounds 2 20 X - Application of Medications - topical 1 5 []  - Application of Medications - injection 0 INTERVENTIONS - Miscellaneous []  - External ear exam 0 Picariello, [name] ([id number]) []  - Specimen Collection (cultures, biopsies, blood, body fluids, etc.) 0 []  - Specimen(s) / Culture(s) sent or taken to Lab for analysis 0 []  - Patient Transfer (multiple staff / Michiel Sites Lift / Similar devices) 0 []  - Simple Staple / Suture removal (25 or less) 0 []  - Complex Staple / Suture removal (26 or more) 0 []  - Hypo / Hyperglycemic Management (close monitor of Blood Glucose) 0 []  - Ankle / Brachial Index (ABI) - do not check if billed separately 0 X - Vital Signs 1 5 Has the patient been seen at the hospital within the last three years: Yes Total Score: 150 Level Of Care: New/Established - Level 4 Electronic Signature(s) Signed: 10/21/2016 4:49:46 PM By: Alejandro Mulling Entered By: Alejandro Mulling on 10/21/2016 16:26:01 Alpha, [name] ([id number]) -------------------------------------------------------------------------------- Encounter Discharge Information Details Patient Name: [name] Date of Service: 10/21/2016 2:30 PM Medical Record Number: [id number] Patient Account Number: [id number] Date of Birth/Sex: 1940/08/10 (76 y.o. Male) Treating RN: Phillis Haggis Primary Care Kem Parcher: Georgann Housekeeper Other  Clinician: Referring Jani Moronta: Georgann Housekeeper Treating Wilmer Berryhill/Extender: Rudene Re in Treatment: 5 Encounter Discharge Information Items Discharge Pain Level: 0 Discharge Condition: Stable Ambulatory Status: Cane Discharge Destination: Home Transportation: Private Auto Accompanied By: self Schedule Follow-up Appointment: Yes Medication Reconciliation completed No and provided to Patient/Care Renelda Kilian: Provided on Clinical Summary of Care: 10/21/2016 Form Type Recipient Paper Patient [facility] Electronic Signature(s) Signed: 10/21/2016 2:57:14 PM By: Gwenlyn Perking Entered By: Gwenlyn Perking on 10/21/2016 14:57:14 [place], [name] ([id number]) -------------------------------------------------------------------------------- Lower Extremity Assessment Details Patient Name: [name] Date of Service: 10/21/2016 2:30 PM Medical Record Number: [id number] Patient Account Number: [id number] Date of Birth/Sex: 25-Jun-1940 (76 y.o. Male) Treating RN: Phillis Haggis Primary Care Latiya Navia: Georgann Housekeeper Other Clinician: Referring Darreon Lutes: Georgann Housekeeper Treating Corda Shutt/Extender: Rudene Re in Treatment: 5 Edema Assessment Assessed: [Left: No] [Right: No] E[Left: dema] [Right: :] Calf Left: Right: Point of Measurement: 35 cm From Medial Instep 36.8 cm 37.6 cm Ankle Left: Right: Point of Measurement: 13 cm From Medial Instep 26.7 cm 25.6 cm Vascular Assessment Pulses: Dorsalis Pedis Palpable: [Left:Yes] [Right:Yes] Posterior Tibial Extremity colors, hair growth, and conditions: Extremity Color: [Left:Normal] [Right:Normal] Temperature of Extremity: [Left:Warm] [Right:Warm] Capillary Refill: [Left:< 3 seconds] [Right:< 3 seconds] Toe Nail Assessment Left: Right: Thick: No No Discolored: Yes Yes Deformed: No No Improper Length and Hygiene: No No Electronic Signature(s) Signed: 10/21/2016 4:49:46 PM By: Alejandro Mulling Entered By: Alejandro Mulling on 10/21/2016  14:33:48 Piccirilli, [name] ([id number]) -------------------------------------------------------------------------------- Multi Wound Chart Details Patient Name: [name] Date of Service: 10/21/2016 2:30 PM Medical Record Number: [id number] Patient Account Number: [id number] Date of Birth/Sex: 1941-01-06 (76 y.o. Male) Treating RN: Phillis Haggis Primary Care Sharlon Pfohl: Georgann Housekeeper Other Clinician: Referring Berlyn Malina: Georgann Housekeeper Treating Claudetta Sallie/Extender: Rudene Re in Treatment: 5 Vital Signs Height(in): 72 Pulse(bpm): 75 Weight(lbs): 236 Blood Pressure 170/74 (mmHg):  Body Mass Index(BMI): 32 Temperature(F): 98.0 Respiratory Rate 18 (breaths/min): Photos: [1:No Photos] [2:No Photos] [N/A:N/A] Wound Location: [1:Left Lower Leg - Lateral] [2:Right Lower Leg - Anterior N/A] Wounding Event: [1:Trauma] [2:Trauma] [N/A:N/A] Primary Etiology: [1:Trauma, Other] [2:Venous Leg Ulcer] [N/A:N/A] Secondary Etiology: [1:N/A] [2:Trauma, Other] [N/A:N/A] Comorbid History: [1:Cataracts, Hypertension, Gout] [2:Cataracts, Hypertension, N/A Gout] Date Acquired: [1:08/25/2016] [2:10/10/2016] [N/A:N/A] Weeks of Treatment: [1:5] [2:1] [N/A:N/A] Wound Status: [1:Open] [2:Open] [N/A:N/A] Measurements L x W x D 2.8x1.8x0.4 [2:5x0.4x0.1] [N/A:N/A] (cm) Area (cm) : [1:3.958] [2:1.571] [N/A:N/A] Volume (cm) : [1:1.583] [2:0.157] [N/A:N/A] % Reduction in Area: [1:-18.60%] [2:30.50%] [N/A:N/A] % Reduction in Volume: -374.00% [2:30.50%] [N/A:N/A] Starting Position 1 12 (o'clock): Ending Position 1 (o'clock): Maximum Distance 1 0.7 (cm): Undermining: [1:Yes] [2:No] [N/A:N/A] Classification: [1:Partial Thickness] [2:Partial Thickness] [N/A:N/A] Exudate Amount: [1:Large] [2:Large] [N/A:N/A] Exudate Type: [1:Serosanguineous] [2:Serosanguineous] [N/A:N/A] Exudate Color: [1:red, brown] [2:red, brown] [N/A:N/A] Wound Margin: [1:Distinct, outline attached] [2:Distinct, outline attached]  [N/A:N/A] Granulation Amount: [1:Medium (34-66%)] [2:Medium (34-66%)] [N/A:N/A] Necrotic Amount: Medium (34-66%) Medium (34-66%) N/A Epithelialization: None None N/A Periwound Skin Texture: No Abnormalities Noted No Abnormalities Noted N/A Periwound Skin No Abnormalities Noted No Abnormalities Noted N/A Moisture: Periwound Skin Color: Erythema: Yes Erythema: Yes N/A Erythema Location: Circumferential Circumferential N/A Temperature: No Abnormality No Abnormality N/A Tenderness on Yes Yes N/A Palpation: Wound Preparation: Ulcer Cleansing: Ulcer Cleansing: N/A Rinsed/Irrigated with Rinsed/Irrigated with Saline, Other: soap and Saline, Other: soap and water water Topical Anesthetic Topical Anesthetic Applied: Other: lidocaine Applied: Other: lidocaine 4% 4% Treatment Notes Electronic Signature(s) Signed: 10/21/2016 2:56:10 PM By: Evlyn Kanner MD, FACS Entered By: Evlyn Kanner on 10/21/2016 14:56:10 [place], [name] ([id number]) -------------------------------------------------------------------------------- Multi-Disciplinary Care Plan Details Patient Name: [name] Date of Service: 10/21/2016 2:30 PM Medical Record Number: [id number] Patient Account Number: [id number] Date of Birth/Sex: 05-Dec-1940 (76 y.o. Male) Treating RN: Phillis Haggis Primary Care Chue Berkovich: Georgann Housekeeper Other Clinician: Referring Zephyr Ridley: Georgann Housekeeper Treating Haley Fuerstenberg/Extender: Rudene Re in Treatment: 5 Active Inactive ` Abuse / Safety / Falls / Self Care Management Nursing Diagnoses: History of Falls Potential for falls Goals: Patient will remain injury free related to falls Date Initiated: 09/15/2016 Target Resolution Date: 11/26/2016 Goal Status: Active Interventions: Assess fall risk on admission and as needed Assess impairment of mobility on admission and as needed per policy Notes: ` Nutrition Nursing Diagnoses: Imbalanced nutrition Potential for alteratiion in  Nutrition/Potential for imbalanced nutrition Goals: Patient/caregiver agrees to and verbalizes understanding of need to use nutritional supplements and/or vitamins as prescribed Date Initiated: 09/15/2016 Target Resolution Date: 12/31/2016 Goal Status: Active Interventions: Assess patient nutrition upon admission and as needed per policy Notes: ` Orientation to the Wound Care Program [place] [place] ([id number]) Nursing Diagnoses: Knowledge deficit related to the wound healing center program Goals: Patient/caregiver will verbalize understanding of the Wound Healing Center Program Date Initiated: 09/15/2016 Target Resolution Date: 10/01/2016 Goal Status: Active Interventions: Provide education on orientation to the wound center Notes: ` Pain, Acute or Chronic Nursing Diagnoses: Pain, acute or chronic: actual or potential Potential alteration in comfort, pain Goals: Patient/caregiver will verbalize adequate pain control between visits Date Initiated: 09/15/2016 Target Resolution Date: 12/31/2016 Goal Status: Active Interventions: Complete pain assessment as per visit requirements Notes: ` Wound/Skin Impairment Nursing Diagnoses: Impaired tissue integrity Knowledge deficit related to ulceration/compromised skin integrity Goals: Ulcer/skin breakdown will have a volume reduction of 80% by week 12 Date Initiated: 09/15/2016 Target Resolution Date: 12/24/2016 Goal Status: Active Interventions: Assess patient/caregiver ability to perform ulcer/skin care regimen upon admission and as needed Notes: Kuhner, [name] (  [id number]) Electronic Signature(s) Signed: 10/21/2016 4:49:46 PM By: Alejandro MullingPinkerton, Debra Entered By: Alejandro MullingPinkerton, Debra on 10/21/2016 14:37:40 [place], [name] ([id number]) -------------------------------------------------------------------------------- Pain Assessment Details Patient Name: Trevor LackHUGHES, [name] Date of Service: 10/21/2016 2:30 PM Medical Record Number: [id number] Patient Account  Number: [id number] Date of Birth/Sex: [birth year](75 y.o. Male) Treating RN: Phillis HaggisPinkerton, Debi Primary Care Ankit Degregorio: Georgann HousekeeperHUSAIN, KARRAR Other Clinician: Referring Veer Elamin: Georgann HousekeeperHUSAIN, KARRAR Treating Eiza Canniff/Extender: Rudene ReBritto, Errol Weeks in Treatment: 5 Active Problems Location of Pain Severity and Description of Pain Patient Has Paino No Site Locations With Dressing Change: No Pain Management and Medication Current Pain Management: Electronic Signature(s) Signed: 10/21/2016 4:49:46 PM By: Alejandro MullingPinkerton, Debra Entered By: Alejandro MullingPinkerton, Debra on 10/21/2016 14:18:47 [place], [name] ([id number]) -------------------------------------------------------------------------------- Patient/Caregiver Education Details Patient Name: Trevor LackHUGHES, [name] Date of Service: 10/21/2016 2:30 PM Medical Record Number: [id number] Patient Account Number: [id number] Date of Birth/Gender: [birth year] [age] y.o. Male) Treating RN: Phillis HaggisPinkerton, Debi Primary Care Physician: Georgann HousekeeperHUSAIN, KARRAR Other Clinician: Referring Physician: Georgann HousekeeperHUSAIN, KARRAR Treating Physician/Extender: Rudene ReBritto, Errol Weeks in Treatment: 5 Education Assessment Education Provided To: Patient Education Topics Provided Wound/Skin Impairment: Handouts: Other: change dressing as ordered Methods: Demonstration, Explain/Verbal Responses: State content correctly Electronic Signature(s) Signed: 10/21/2016 4:49:46 PM By: Alejandro MullingPinkerton, Debra Entered By: Alejandro MullingPinkerton, Debra on 10/21/2016 14:38:18 [place], [name] ([id number]) -------------------------------------------------------------------------------- Wound Assessment Details Patient Name: Trevor LackHUGHES, [name] Date of Service: 10/21/2016 2:30 PM Medical Record Number: [id number] Patient Account Number: [id number] Date of Birth/Sex: [birth year] [age] y.o. Male) Treating RN: Phillis HaggisPinkerton, Debi Primary Care Tommye Lehenbauer: Georgann HousekeeperHUSAIN, KARRAR Other Clinician: Referring Patrese Neal: Georgann HousekeeperHUSAIN, KARRAR Treating Aldon Hengst/Extender: Rudene ReBritto, Errol Weeks in Treatment:  5 Wound Status Wound Number: 1 Primary Etiology: Trauma, Other Wound Location: Left Lower Leg - Lateral Wound Status: Open Wounding Event: Trauma Comorbid History: Cataracts, Hypertension, Gout Date Acquired: 08/25/2016 Weeks Of Treatment: 5 Clustered Wound: No Photos Photo Uploaded By: Alejandro MullingPinkerton, Debra on 10/21/2016 16:29:22 Wound Measurements Length: (cm) 2.8 Width: (cm) 1.8 Depth: (cm) 0.4 Area: (cm) 3.958 Volume: (cm) 1.583 % Reduction in Area: -18.6% % Reduction in Volume: -374% Epithelialization: None Tunneling: No Undermining: Yes Starting Position (o'clock): 12 Maximum Distance: (cm) 0.7 Wound Description Classification: Partial Thickness Foul Odor Afte Wound Margin: Distinct, outline attached Slough/Fibrino Exudate Amount: Large Exudate Type: Serosanguineous Exudate Color: red, brown r Cleansing: No Yes Wound Bed Granulation Amount: Medium (34-66%) Mantel, [name] ([id number]) Necrotic Amount: Medium (34-66%) Necrotic Quality: Adherent Slough Periwound Skin Texture Texture Color No Abnormalities Noted: No No Abnormalities Noted: No Erythema: Yes Moisture Erythema Location: Circumferential No Abnormalities Noted: No Temperature / Pain Temperature: No Abnormality Tenderness on Palpation: Yes Wound Preparation Ulcer Cleansing: Rinsed/Irrigated with Saline, Other: soap and water, Topical Anesthetic Applied: Other: lidocaine 4%, Treatment Notes Wound #1 (Left, Lateral Lower Leg) 1. Cleansed with: Clean wound with Normal Saline Cleanse wound with antibacterial soap and water 2. Anesthetic Topical Lidocaine 4% cream to wound bed prior to debridement 4. Dressing Applied: Aquacel Ag 5. Secondary Dressing Applied ABD Pad Dry Gauze Kerlix/Conform 7. Secured with Tape Notes kerlix, coban Electronic Signature(s) Signed: 10/21/2016 4:49:46 PM By: Alejandro MullingPinkerton, Debra Entered By: Alejandro MullingPinkerton, Debra on 10/21/2016 14:31:42 [place]HUGHES, [name]  ([id number]) -------------------------------------------------------------------------------- Wound Assessment Details Patient Name: Trevor LackHUGHES, [name] Date of Service: 10/21/2016 2:30 PM Medical Record Number: [id number] Patient Account Number: [id number] Date of Birth/Sex: [birth year] [age] y.o. Male) Treating RN: Phillis HaggisPinkerton, Debi Primary Care Lorriane Dehart: Georgann HousekeeperHUSAIN, KARRAR Other Clinician: Referring Alaric Gladwin: Georgann HousekeeperHUSAIN, KARRAR Treating Naara Kelty/Extender: Rudene ReBritto, Errol Weeks in Treatment: 5 Wound Status Wound Number: 2 Primary Etiology: Venous Leg Ulcer Wound Location: Right Lower Leg - Anterior Secondary Etiology: Trauma, Other Wounding Event:  Trauma Wound Status: Open Date Acquired: 10/10/2016 Comorbid History: Cataracts, Hypertension, Gout Weeks Of Treatment: 1 Clustered Wound: No Photos Photo Uploaded By: Alejandro Mulling on 10/21/2016 16:29:23 Wound Measurements Length: (cm) 5 Width: (cm) 0.4 Depth: (cm) 0.1 Area: (cm) 1.571 Volume: (cm) 0.157 % Reduction in Area: 30.5% % Reduction in Volume: 30.5% Epithelialization: None Tunneling: No Undermining: No Wound Description Classification: Partial Thickness Foul Odor Afte Wound Margin: Distinct, outline attached Slough/Fibrino Exudate Amount: Large Exudate Type: Serosanguineous Exudate Color: red, brown r Cleansing: No Yes Wound Bed Granulation Amount: Medium (34-66%) Necrotic Amount: Medium (34-66%) Necrotic Quality: Adherent Slough Periwound Skin Texture [place], [name] ([id number]) Texture Color No Abnormalities Noted: No No Abnormalities Noted: No Erythema: Yes Moisture Erythema Location: Circumferential No Abnormalities Noted: No Temperature / Pain Temperature: No Abnormality Tenderness on Palpation: Yes Wound Preparation Ulcer Cleansing: Rinsed/Irrigated with Saline, Other: soap and water, Topical Anesthetic Applied: Other: lidocaine 4%, Treatment Notes Wound #2 (Right, Anterior Lower Leg) 1. Cleansed with: Clean  wound with Normal Saline Cleanse wound with antibacterial soap and water 2. Anesthetic Topical Lidocaine 4% cream to wound bed prior to debridement 4. Dressing Applied: Other dressing (specify in notes) 5. Secondary Dressing Applied ABD Pad Dry Gauze Kerlix/Conform 7. Secured with Tape Notes kerlix, coban, [employer] forces operational officer) Signed: 10/21/2016 4:49:46 PM By: Alejandro Mulling Entered By: Alejandro Mulling on 10/21/2016 14:31:57 [place], [name] ([id number]) -------------------------------------------------------------------------------- Vitals Details Patient Name: [name] Date of Service: 10/21/2016 2:30 PM Medical Record Number: [id number] Patient Account Number: [id number] Date of Birth/Sex: 09-13-1940 (76 y.o. Male) Treating RN: Phillis Haggis Primary Care Quade Ramirez: Georgann Housekeeper Other Clinician: Referring Evonte Prestage: Georgann Housekeeper Treating Sadonna Kotara/Extender: Rudene Re in Treatment: 5 Vital Signs Time Taken: 14:19 Temperature (F): 98.0 Height (in): 72 Pulse (bpm): 75 Weight (lbs): 236 Respiratory Rate (breaths/min): 18 Body Mass Index (BMI): 32 Blood Pressure (mmHg): 170/74 Reference Range: 80 - 120 mg / dl Notes Made Dr. Meyer Russel aware of pts BP. Electronic Signature(s) Signed: 10/21/2016 4:49:46 PM By: Alejandro Mulling Entered By: Alejandro Mulling on 10/21/2016 14:21:26

## 2016-10-25 DIAGNOSIS — L03116 Cellulitis of left lower limb: Secondary | ICD-10-CM | POA: Diagnosis not present

## 2016-10-25 DIAGNOSIS — S81812D Laceration without foreign body, left lower leg, subsequent encounter: Secondary | ICD-10-CM | POA: Diagnosis not present

## 2016-10-25 DIAGNOSIS — I890 Lymphedema, not elsewhere classified: Secondary | ICD-10-CM | POA: Diagnosis not present

## 2016-10-28 ENCOUNTER — Encounter: Payer: PPO | Attending: Physician Assistant | Admitting: Physician Assistant

## 2016-10-28 DIAGNOSIS — S81811A Laceration without foreign body, right lower leg, initial encounter: Secondary | ICD-10-CM | POA: Insufficient documentation

## 2016-10-28 DIAGNOSIS — L97322 Non-pressure chronic ulcer of left ankle with fat layer exposed: Secondary | ICD-10-CM | POA: Diagnosis not present

## 2016-10-28 DIAGNOSIS — L03116 Cellulitis of left lower limb: Secondary | ICD-10-CM | POA: Insufficient documentation

## 2016-10-28 DIAGNOSIS — M70862 Other soft tissue disorders related to use, overuse and pressure, left lower leg: Secondary | ICD-10-CM | POA: Insufficient documentation

## 2016-10-28 DIAGNOSIS — M109 Gout, unspecified: Secondary | ICD-10-CM | POA: Insufficient documentation

## 2016-10-28 DIAGNOSIS — S91002A Unspecified open wound, left ankle, initial encounter: Secondary | ICD-10-CM | POA: Diagnosis not present

## 2016-10-28 DIAGNOSIS — S81812A Laceration without foreign body, left lower leg, initial encounter: Secondary | ICD-10-CM | POA: Insufficient documentation

## 2016-10-28 DIAGNOSIS — W010XXA Fall on same level from slipping, tripping and stumbling without subsequent striking against object, initial encounter: Secondary | ICD-10-CM | POA: Diagnosis not present

## 2016-10-28 DIAGNOSIS — I890 Lymphedema, not elsewhere classified: Secondary | ICD-10-CM | POA: Insufficient documentation

## 2016-10-28 DIAGNOSIS — S81801A Unspecified open wound, right lower leg, initial encounter: Secondary | ICD-10-CM | POA: Diagnosis not present

## 2016-10-29 NOTE — Progress Notes (Signed)
MANINDER, DEBOER ([id number]) Visit Report for 10/28/2016 Arrival Information Details Patient Name: [name], [name] Date of Service: 10/28/2016 3:30 PM Medical Record Number: [id number] Patient Account Number: [id number] Date of Birth/Sex: Apr 26, 1940 (76 y.o. Male) Treating RN: Huel Coventry Primary Care Khristian Seals: Georgann Housekeeper Other Clinician: Referring Kashish Yglesias: Georgann Housekeeper Treating Shahad Mazurek/Extender: Linwood Dibbles, HOYT Weeks in Treatment: 6 Visit Information History Since Last Visit Added or deleted any medications: No Patient Arrived: Ambulatory Any new allergies or adverse reactions: No Arrival Time: 15:28 Had a fall or experienced change in No Accompanied By: self activities of daily living that may affect Transfer Assistance: None risk of falls: Patient Identification Verified: Yes Signs or symptoms of abuse/neglect since last No Secondary Verification Process Yes visito Completed: Hospitalized since last visit: No Patient Requires Transmission- No Has Dressing in Place as Prescribed: Yes Based Precautions: Has Compression in Place as Prescribed: Yes Patient Has Alerts: Yes Pain Present Now: No Patient Alerts: L ABI non- compressible R ABI non- compressible Electronic Signature(s) Signed: 10/28/2016 4:43:28 PM By: Elliot Gurney, BSN, RN, CWS, Kim RN, BSN Entered By: Elliot Gurney, BSN, RN, CWS, Kim on 10/28/2016 15:30:21 [place], Kathlene November ([id number]) -------------------------------------------------------------------------------- Clinic Level of Care Assessment Details Patient Name: [name] Date of Service: 10/28/2016 3:30 PM Medical Record Number: [id number] Patient Account Number: [id number] Date of Birth/Sex: 03/19/1941 (76 y.o. Male) Treating RN: Huel Coventry Primary Care Jovonta Levit: Georgann Housekeeper Other Clinician: Referring Adamary Savary: Georgann Housekeeper Treating Alpheus Stiff/Extender: Linwood Dibbles, HOYT Weeks in Treatment: 6 Clinic Level of Care Assessment Items TOOL 4 Quantity Score []  - Use when  only an EandM is performed on FOLLOW-UP visit 0 ASSESSMENTS - Nursing Assessment / Reassessment []  - Reassessment of Co-morbidities (includes updates in patient status) 0 X - Reassessment of Adherence to Treatment Plan 1 5 ASSESSMENTS - Wound and Skin Assessment / Reassessment []  - Simple Wound Assessment / Reassessment - one wound 0 X - Complex Wound Assessment / Reassessment - multiple wounds 2 5 []  - Dermatologic / Skin Assessment (not related to wound area) 0 ASSESSMENTS - Focused Assessment X - Circumferential Edema Measurements - multi extremities 1 5 []  - Nutritional Assessment / Counseling / Intervention 0 []  - Lower Extremity Assessment (monofilament, tuning fork, pulses) 0 []  - Peripheral Arterial Disease Assessment (using hand held doppler) 0 ASSESSMENTS - Ostomy and/or Continence Assessment and Care []  - Incontinence Assessment and Management 0 []  - Ostomy Care Assessment and Management (repouching, etc.) 0 PROCESS - Coordination of Care X - Simple Patient / Family Education for ongoing care 1 15 []  - Complex (extensive) Patient / Family Education for ongoing care 0 X - Staff obtains Chiropractor, Records, Test Results / Process Orders 1 10 []  - Staff telephones HHA, Nursing Homes / Clarify orders / etc 0 []  - Routine Transfer to another Facility (non-emergent condition) 0 [place], [name] ([id number]) []  - Routine Hospital Admission (non-emergent condition) 0 []  - New Admissions / Manufacturing engineer / Ordering NPWT, Apligraf, etc. 0 []  - Emergency Hospital Admission (emergent condition) 0 X - Simple Discharge Coordination 1 10 []  - Complex (extensive) Discharge Coordination 0 PROCESS - Special Needs []  - Pediatric / Minor Patient Management 0 []  - Isolation Patient Management 0 []  - Hearing / Language / Visual special needs 0 []  - Assessment of Community assistance (transportation, D/C planning, etc.) 0 []  - Additional assistance / Altered mentation 0 []  - Support  Surface(s) Assessment (bed, cushion, seat, etc.) 0 INTERVENTIONS - Wound Cleansing / Measurement []  - Simple Wound Cleansing - one wound 0 X -  Complex Wound Cleansing - multiple wounds 2 5 X - Wound Imaging (photographs - any number of wounds) 1 5 []  - Wound Tracing (instead of photographs) 0 []  - Simple Wound Measurement - one wound 0 X - Complex Wound Measurement - multiple wounds 2 5 INTERVENTIONS - Wound Dressings []  - Small Wound Dressing one or multiple wounds 0 X - Medium Wound Dressing one or multiple wounds 1 15 X - Large Wound Dressing one or multiple wounds 1 20 []  - Application of Medications - topical 0 []  - Application of Medications - injection 0 INTERVENTIONS - Miscellaneous []  - External ear exam 0 [place], [name] ([id number]) []  - Specimen Collection (cultures, biopsies, blood, body fluids, etc.) 0 []  - Specimen(s) / Culture(s) sent or taken to Lab for analysis 0 []  - Patient Transfer (multiple staff / Michiel Sites Lift / Similar devices) 0 []  - Simple Staple / Suture removal (25 or less) 0 []  - Complex Staple / Suture removal (26 or more) 0 []  - Hypo / Hyperglycemic Management (close monitor of Blood Glucose) 0 []  - Ankle / Brachial Index (ABI) - do not check if billed separately 0 X - Vital Signs 1 5 Has the patient been seen at the hospital within the last three years: Yes Total Score: 120 Level Of Care: New/Established - Level 4 Electronic Signature(s) Signed: 10/28/2016 5:05:00 PM By: Elliot Gurney, BSN, RN, CWS, Kim RN, BSN Entered By: Elliot Gurney, BSN, RN, CWS, Kim on 10/28/2016 16:59:41 [place], [name] ([id number]) -------------------------------------------------------------------------------- Encounter Discharge Information Details Patient Name: [name] Date of Service: 10/28/2016 3:30 PM Medical Record Number: [id number] Patient Account Number: [id number] Date of Birth/Sex: 1940/09/06 (75 y.o. Male) Treating RN: Huel Coventry Primary Care Tyshae Stair: Georgann Housekeeper Other  Clinician: Referring Catalena Stanhope: Georgann Housekeeper Treating Taijon Vink/Extender: Linwood Dibbles, HOYT Weeks in Treatment: 6 Encounter Discharge Information Items Schedule Follow-up Appointment: No Medication Reconciliation completed No and provided to Patient/Care Hindy Perrault: Provided on Clinical Summary of Care: 10/28/2016 Form Type Recipient Paper Patient [facility] Electronic Signature(s) Signed: 10/28/2016 4:20:39 PM By: Gwenlyn Perking Entered By: Gwenlyn Perking on 10/28/2016 16:20:39 [place], [name] ([id number]) -------------------------------------------------------------------------------- Lower Extremity Assessment Details Patient Name: [name] Date of Service: 10/28/2016 3:30 PM Medical Record Number: [id number] Patient Account Number: [id number] Date of Birth/Sex: 1940-07-09 (75 y.o. Male) Treating RN: Huel Coventry Primary Care Kimetha Trulson: Georgann Housekeeper Other Clinician: Referring Lindalee Huizinga: Georgann Housekeeper Treating Lyrica Mcclarty/Extender: Linwood Dibbles, HOYT Weeks in Treatment: 6 Edema Assessment Assessed: [Left: No] [Right: No] E[Left: dema] [Right: :] Calf Left: Right: Point of Measurement: 35 cm From Medial Instep 37 cm 37.5 cm Ankle Left: Right: Point of Measurement: 13 cm From Medial Instep 27.5 cm 26.5 cm Vascular Assessment Pulses: Dorsalis Pedis Palpable: [Left:Yes] [Right:Yes] Posterior Tibial Extremity colors, hair growth, and conditions: Extremity Color: [Left:Normal] [Right:Normal] Hair Growth on Extremity: [Left:Yes] [Right:Yes] Temperature of Extremity: [Left:Warm] [Right:Warm] Capillary Refill: [Left:< 3 seconds] [Right:< 3 seconds] Dependent Rubor: [Left:No] Blanched when Elevated: [Left:No] Lipodermatosclerosis: [Left:No] [Right:No] Toe Nail Assessment Left: Right: Thick: No No Discolored: Yes Yes Deformed: Yes Yes Improper Length and Hygiene: Yes Yes Electronic Signature(s) Signed: 10/28/2016 4:43:28 PM By: Elliot Gurney, BSN, RN, CWS, Kim RN, BSN [place], [name] ([id number]) Entered  By: Elliot Gurney, BSN, RN, CWS, Kim on 10/28/2016 15:44:18 [place], [name] ([id number]) -------------------------------------------------------------------------------- Multi Wound Chart Details Patient Name: [name] Date of Service: 10/28/2016 3:30 PM Medical Record Number: [id number] Patient Account Number: [id number] Date of Birth/Sex: [birth year] (76 y.o. Male) Treating RN: Huel Coventry Primary Care Millenia Waldvogel: Georgann Housekeeper Other Clinician: Referring Ranyah Groeneveld: Georgann Housekeeper Treating Hyde Sires/Extender:  STONE III, HOYT Weeks in Treatment: 6 Vital Signs Height(in): 72 Pulse(bpm): 73 Weight(lbs): 236 Blood Pressure 144/68 (mmHg): Body Mass Index(BMI): 32 Temperature(F): 98.1 Respiratory Rate 16 (breaths/min): Photos: [N/A:N/A] Wound Location: Left Lower Leg - Lateral Right Lower Leg - Anterior N/A Wounding Event: Trauma Trauma N/A Primary Etiology: Trauma, Other Venous Leg Ulcer N/A Secondary Etiology: N/A Trauma, Other N/A Comorbid History: Cataracts, Hypertension, Cataracts, Hypertension, N/A Gout Gout Date Acquired: 08/25/2016 10/10/2016 N/A Weeks of Treatment: 6 2 N/A Wound Status: Open Open N/A Measurements L x W x D 2.6x1.5x0.4 5x0.4x0.1 N/A (cm) Area (cm) : 3.063 1.571 N/A Volume (cm) : 1.225 0.157 N/A % Reduction in Area: 8.20% 30.50% N/A % Reduction in Volume: -266.80% 30.50% N/A Starting Position 1 10 (o'clock): Ending Position 1 2 (o'clock): Maximum Distance 1 0.5 (cm): Undermining: Yes N/A N/A Classification: Partial Thickness Partial Thickness N/A Exudate Amount: Large Large N/A Sallie, [name] ([id number]) Exudate Type: Serosanguineous Serosanguineous N/A Exudate Color: red, brown red, brown N/A Wound Margin: Epibole Distinct, outline attached N/A Granulation Amount: Medium (34-66%) Medium (34-66%) N/A Necrotic Amount: Medium (34-66%) Medium (34-66%) N/A Exposed Structures: Fat Layer (Subcutaneous N/A N/A Tissue) Exposed: Yes Fascia: No Tendon: No Muscle:  No Joint: No Bone: No Epithelialization: None None N/A Periwound Skin Texture: Induration: Yes No Abnormalities Noted N/A Periwound Skin No Abnormalities Noted No Abnormalities Noted N/A Moisture: Periwound Skin Color: Erythema: Yes Erythema: Yes N/A Erythema Location: Circumferential Circumferential N/A Temperature: No Abnormality No Abnormality N/A Tenderness on Yes Yes N/A Palpation: Wound Preparation: Ulcer Cleansing: Ulcer Cleansing: N/A Rinsed/Irrigated with Rinsed/Irrigated with Saline, Other: soap and Saline water Topical Anesthetic Topical Anesthetic Applied: None Applied: Other: lidocaine 4% Assessment Notes: N/A Covered with steri-strips N/A unable to visualize Treatment Notes Electronic Signature(s) Signed: 10/28/2016 4:43:28 PM By: Elliot Gurney, BSN, RN, CWS, Kim RN, BSN Entered By: Elliot Gurney, BSN, RN, CWS, Kim on 10/28/2016 16:03:40 [place], Kathlene November ([id number]) -------------------------------------------------------------------------------- Multi-Disciplinary Care Plan Details Patient Name: [name] Date of Service: 10/28/2016 3:30 PM Medical Record Number: [id number] Patient Account Number: [id number] Date of Birth/Sex: 10/10/1940 (76 y.o. Male) Treating RN: Huel Coventry Primary Care Dilyn Smiles: Georgann Housekeeper Other Clinician: Referring Braydon Kullman: Georgann Housekeeper Treating Faiz Weber/Extender: Linwood Dibbles, HOYT Weeks in Treatment: 6 Active Inactive ` Abuse / Safety / Falls / Self Care Management Nursing Diagnoses: History of Falls Potential for falls Goals: Patient will remain injury free related to falls Date Initiated: 09/15/2016 Target Resolution Date: 11/26/2016 Goal Status: Active Interventions: Assess fall risk on admission and as needed Assess impairment of mobility on admission and as needed per policy Notes: ` Nutrition Nursing Diagnoses: Imbalanced nutrition Potential for alteratiion in Nutrition/Potential for imbalanced nutrition Goals: Patient/caregiver  agrees to and verbalizes understanding of need to use nutritional supplements and/or vitamins as prescribed Date Initiated: 09/15/2016 Target Resolution Date: 12/31/2016 Goal Status: Active Interventions: Assess patient nutrition upon admission and as needed per policy Notes: ` Orientation to the Wound Care Program [place], [place] ([id number]) Nursing Diagnoses: Knowledge deficit related to the wound healing center program Goals: Patient/caregiver will verbalize understanding of the Wound Healing Center Program Date Initiated: 09/15/2016 Target Resolution Date: 10/01/2016 Goal Status: Active Interventions: Provide education on orientation to the wound center Notes: ` Pain, Acute or Chronic Nursing Diagnoses: Pain, acute or chronic: actual or potential Potential alteration in comfort, pain Goals: Patient/caregiver will verbalize adequate pain control between visits Date Initiated: 09/15/2016 Target Resolution Date: 12/31/2016 Goal Status: Active Interventions: Complete pain assessment as per visit requirements Notes: ` Wound/Skin Impairment Nursing Diagnoses: Impaired tissue integrity  Knowledge deficit related to ulceration/compromised skin integrity Goals: Ulcer/skin breakdown will have a volume reduction of 80% by week 12 Date Initiated: 09/15/2016 Target Resolution Date: 12/24/2016 Goal Status: Active Interventions: Assess patient/caregiver ability to perform ulcer/skin care regimen upon admission and as needed Notes: Trevor LackHUGHES, [name] ([id number]) Electronic Signature(s) Signed: 10/28/2016 4:43:28 PM By: Elliot GurneyWoody, BSN, RN, CWS, Kim RN, BSN Entered By: Elliot GurneyWoody, BSN, RN, CWS, Kim on 10/28/2016 15:44:50 [place]illHUGHES, [name] ([id number]) -------------------------------------------------------------------------------- Pain Assessment Details Patient Name: Trevor LackHUGHES, [name] Date of Service: 10/28/2016 3:30 PM Medical Record Number: [id number] Patient Account Number: [id number] Date of Birth/Sex: [birth year]  36(75 y.o. Male) Treating RN: Huel CoventryWoody, Kim Primary Care Jacinto Keil: Georgann HousekeeperHUSAIN, KARRAR Other Clinician: Referring Tianah Lonardo: Georgann HousekeeperHUSAIN, KARRAR Treating Takela Varden/Extender: Linwood DibblesSTONE III, HOYT Weeks in Treatment: 6 Active Problems Location of Pain Severity and Description of Pain Patient Has Paino No Site Locations With Dressing Change: No Pain Management and Medication Current Pain Management: Goals for Pain Management Topical or injectable lidocaine is offered to patient for acute pain when surgical debridement is performed. If needed, Patient is instructed to use over the counter pain medication for the following 24-48 hours after debridement. Wound care MDs do not prescribed pain medications. Patient has chronic pain or uncontrolled pain. Patient has been instructed to make an appointment with their Primary Care Physician for pain management. Electronic Signature(s) Signed: 10/28/2016 4:43:28 PM By: Elliot GurneyWoody, BSN, RN, CWS, Kim RN, BSN Entered By: Elliot GurneyWoody, BSN, RN, CWS, Kim on 10/28/2016 15:31:34 [place], Kathlene NovemberMIKE ([id number]) -------------------------------------------------------------------------------- Wound Assessment Details Patient Name: Trevor LackHUGHES, [name] Date of Service: 10/28/2016 3:30 PM Medical Record Number: [id number] Patient Account Number: [id number] Date of Birth/Sex: [birth year](75 y.o. Male) Treating RN: Huel CoventryWoody, Kim Primary Care Berdella Bacot: Georgann HousekeeperHUSAIN, KARRAR Other Clinician: Referring Shilo Pauwels: Georgann HousekeeperHUSAIN, KARRAR Treating Trinady Milewski/Extender: Linwood DibblesSTONE III, HOYT Weeks in Treatment: 6 Wound Status Wound Number: 1 Primary Etiology: Trauma, Other Wound Location: Left Lower Leg - Lateral Wound Status: Open Wounding Event: Trauma Comorbid History: Cataracts, Hypertension, Gout Date Acquired: 08/25/2016 Weeks Of Treatment: 6 Clustered Wound: No Photos Wound Measurements Length: (cm) 2.6 Width: (cm) 1.5 Depth: (cm) 0.4 Area: (cm) 3.063 Volume: (cm) 1.225 % Reduction in Area: 8.2% % Reduction in Volume:  -266.8% Epithelialization: None Tunneling: No Undermining: Yes Starting Position (o'clock): 10 Ending Position (o'clock): 2 Maximum Distance: (cm) 0.5 Wound Description Classification: Partial Thickness Wound Margin: Epibole Exudate Amount: Large Exudate Type: Serosanguineous Exudate Color: red, brown Foul Odor After Cleansing: No Slough/Fibrino Yes Wound Bed Granulation Amount: Medium (34-66%) Exposed Structure Necrotic Amount: Medium (34-66%) Fascia Exposed: No Necrotic Quality: Adherent Slough Fat Layer (Subcutaneous Tissue) Exposed: Yes [place], [name] ([id number]) Tendon Exposed: No Muscle Exposed: No Joint Exposed: No Bone Exposed: No Periwound Skin Texture Texture Color No Abnormalities Noted: No No Abnormalities Noted: No Induration: Yes Erythema: Yes Erythema Location: Circumferential Moisture No Abnormalities Noted: No Temperature / Pain Temperature: No Abnormality Tenderness on Palpation: Yes Wound Preparation Ulcer Cleansing: Rinsed/Irrigated with Saline, Other: soap and water, Topical Anesthetic Applied: Other: lidocaine 4%, Electronic Signature(s) Signed: 10/28/2016 4:43:28 PM By: Elliot GurneyWoody, BSN, RN, CWS, Kim RN, BSN Entered By: Elliot GurneyWoody, BSN, RN, CWS, Kim on 10/28/2016 16:02:34 [place] Kathlene NovemberMIKE ([id number]) -------------------------------------------------------------------------------- Wound Assessment Details Patient Name: Trevor LackHUGHES, [name] Date of Service: 10/28/2016 3:30 PM Medical Record Number: [id number] Patient Account Number: [id number] Date of Birth/Sex: [birth year] 2(75 y.o. Male) Treating RN: Huel CoventryWoody, Kim Primary Care Raylon Lamson: Georgann HousekeeperHUSAIN, KARRAR Other Clinician: Referring Ermal Brzozowski: Georgann HousekeeperHUSAIN, KARRAR Treating Gentry Seeber/Extender: Linwood DibblesSTONE III, HOYT Weeks in Treatment: 6 Wound Status Wound Number: 2 Primary Etiology: Venous Leg Ulcer Wound Location: Right  Lower Leg - Anterior Secondary Etiology: Trauma, Other Wounding Event: Trauma Wound Status: Open Date Acquired:  10/10/2016 Comorbid History: Cataracts, Hypertension, Gout Weeks Of Treatment: 2 Clustered Wound: No Photos Wound Measurements Length: (cm) 5 Width: (cm) 0.4 Depth: (cm) 0.1 Area: (cm) 1.571 Volume: (cm) 0.157 % Reduction in Area: 30.5% % Reduction in Volume: 30.5% Epithelialization: None Wound Description Classification: Partial Thickness Wound Margin: Distinct, outline attached Exudate Amount: Large Exudate Type: Serosanguineous Exudate Color: red, brown Foul Odor After Cleansing: No Slough/Fibrino Yes Wound Bed Granulation Amount: Medium (34-66%) Necrotic Amount: Medium (34-66%) Necrotic Quality: Adherent Slough Periwound Skin Texture Texture Color No Abnormalities Noted: No No Abnormalities Noted: No Erythema: Yes Moisture Rhudy, [name] ([id number]) No Abnormalities Noted: No Erythema Location: Circumferential Temperature / Pain Temperature: No Abnormality Tenderness on Palpation: Yes Wound Preparation Ulcer Cleansing: Rinsed/Irrigated with Saline Topical Anesthetic Applied: None Assessment Notes Covered with steri-strips unable to visualize Electronic Signature(s) Signed: 10/28/2016 4:43:28 PM By: Elliot Gurney, BSN, RN, CWS, Kim RN, BSN Entered By: Elliot Gurney, BSN, RN, CWS, Kim on 10/28/2016 15:41:14 [place], [name] ([id number]) -------------------------------------------------------------------------------- Vitals Details Patient Name: [name] Date of Service: 10/28/2016 3:30 PM Medical Record Number: [id number] Patient Account Number: [id number] Date of Birth/Sex: 1941/03/15 (76 y.o. Male) Treating RN: Huel Coventry Primary Care Kaisyn Reinhold: Georgann Housekeeper Other Clinician: Referring Chavie Kolinski: Georgann Housekeeper Treating Osceola Depaz/Extender: Linwood Dibbles, HOYT Weeks in Treatment: 6 Vital Signs Time Taken: 15:31 Temperature (F): 98.1 Height (in): 72 Pulse (bpm): 73 Weight (lbs): 236 Respiratory Rate (breaths/min): 16 Body Mass Index (BMI): 32 Blood Pressure (mmHg):  144/68 Reference Range: 80 - 120 mg / dl Electronic Signature(s) Signed: 10/28/2016 4:43:28 PM By: Elliot Gurney, BSN, RN, CWS, Kim RN, BSN Entered By: Elliot Gurney, BSN, RN, CWS, Kim on 10/28/2016 15:32:12

## 2016-10-29 NOTE — Progress Notes (Signed)
JUANDANIEL, MANFREDO ([id number]) Visit Report for 10/28/2016 Chief Complaint Document Details Patient Name: [name], [name] Date of Service: 10/28/2016 3:30 PM Medical Record Number: [id number] Patient Account Number: [id number] Date of Birth/Sex: Aug 11, 1940 (76 y.o. Male) Treating RN: Huel Coventry Primary Care Provider: Georgann Housekeeper Other Clinician: Referring Provider: Georgann Housekeeper Treating Provider/Extender: Linwood Dibbles, HOYT Weeks in Treatment: 6 Information Obtained from: Patient Chief Complaint Patient seen for complaints of Non-Healing Wound left lower lateral leg Electronic Signature(s) Signed: 10/28/2016 4:47:40 PM By: Lenda Kelp PA-C Entered By: Lenda Kelp on 10/28/2016 16:00:35 [place], [name] ([id number]) -------------------------------------------------------------------------------- HPI Details Patient Name: [name] Date of Service: 10/28/2016 3:30 PM Medical Record Number: [id number] Patient Account Number: [id number] Date of Birth/Sex: [birth year] (75 y.o. Male) Treating RN: Huel Coventry Primary Care Provider: Georgann Housekeeper Other Clinician: Referring Provider: Georgann Housekeeper Treating Provider/Extender: Linwood Dibbles, HOYT Weeks in Treatment: 6 History of Present Illness Location: left lower lateral leg Quality: Patient reports experiencing a sharp pain to affected area(s). Severity: Patient states wound are getting worse. Duration: Patient has had the wound for < 4 weeks prior to presenting for treatment Timing: Pain in wound is Intermittent (comes and goes Context: The wound occurred when the patient tripped and had a fall and injured his left leg Modifying Factors: Other treatment(s) tried include:had gone to an urgent care couple of times a day taken an x-ray and was put on antibiotics Associated Signs and Symptoms: Patient reports having increase swelling and redness to the leg HPI Description: 76 year old gentleman who had a injury to the left ankle and foot 3 weeks ago  was seen recently in the ER after he had swelling and redness of the left ankle. He is also known to have a flareup of gout recently. After he was examined in the urgent care he was placed on Keflex and Bactrim and referred to the wound center. A recent x-ray of the left ankle did not show any fractures or abnormality. The patient doesn't take very good care of his health has never been worked up for chronic lymphedema which she's had and sees a physician about once a year. He has not been aware that he has bilateral lower extremity lymphedema. he does not smoke. He has never had a workup for his lymphedema. 09/29/2016 -- the patient's insurance will not cover the snap vacuum system and he is finding it difficult to get home health as his copayment is $25 each time. He will not be able to afford the [employer] wound VAC either. 10/14/16 patient presents today for fault evaluation concerning his left lateral ankle wound. Unfortunately he has a skin tear which occurred on Monday of this week over the interior shin of his right lower extremity. Fortunately he did we approximate the flap as well as she could and I think this has done fairly well. Nonetheless he continues to have some discomfort at this site which is completely understandable. He is concerned about how well this is going to heal hopefully being that he did catch it early this will heal up quickly. He did question how long this would take I explained that I cannot give him an exact time but hopefully it will not be too long especially since the undermining seems to be filling in rapidly. 10/21/2016 -- besides the fall and the lacerated wound on his right lateral calf the rest of his health has been going okay and I have reviewed his wounds and made appropriate changes 10/28/16 on evaluation today patient appears to  be doing well in regard to the left lateral ankle wound. His right anterior shin wound still had Steri-Strips in place so it appears  to be doing well. Fortunately there's no evidence of infection. Electronic Signature(s) [place], [place] ([id number]) Signed: 10/28/2016 4:47:40 PM By: Lenda Kelp PA-C Entered By: Lenda Kelp on 10/28/2016 16:34:11 [place], [name] ([id number]) -------------------------------------------------------------------------------- Physical Exam Details Patient Name: [name] Date of Service: 10/28/2016 3:30 PM Medical Record Number: [id number] Patient Account Number: [id number] Date of Birth/Sex: [birth year] (76 y.o. Male) Treating RN: Huel Coventry Primary Care Provider: Georgann Housekeeper Other Clinician: Referring Provider: Georgann Housekeeper Treating Provider/Extender: Linwood Dibbles, HOYT Weeks in Treatment: 6 Constitutional Well-nourished and well-hydrated in no acute distress. Respiratory normal breathing without difficulty. clear to auscultation bilaterally. Cardiovascular regular rate and rhythm with normal S1, S2. Psychiatric this patient is able to make decisions and demonstrates good insight into disease process. Alert and Oriented x 3. pleasant and cooperative. Notes Patient had a net granulation bed in regard to left lower extremity and shows no evidence of infection. Steri- Strips are still in place over the right anterior shin in this appears to be healing well. Electronic Signature(s) Signed: 10/28/2016 4:47:40 PM By: Lenda Kelp PA-C Entered By: Lenda Kelp on 10/28/2016 16:34:42 [place], [name] ([id number]) -------------------------------------------------------------------------------- Physician Orders Details Patient Name: [name] Date of Service: 10/28/2016 3:30 PM Medical Record Number: [id number] Patient Account Number: [id number] Date of Birth/Sex: 11/04/1940 (76 y.o. Male) Treating RN: Huel Coventry Primary Care Provider: Georgann Housekeeper Other Clinician: Referring Provider: Georgann Housekeeper Treating Provider/Extender: Linwood Dibbles, HOYT Weeks in Treatment: 6 Verbal / Phone  Orders: No Diagnosis Coding ICD-10 Coding Code Description [phone] Laceration without foreign body, left lower leg, initial encounter [phone] Other soft tissue disorders related to use, overuse and pressure, left lower leg I89.0 Lymphedema, not elsewhere classified L97.322 Non-pressure chronic ulcer of left ankle with fat layer exposed L03.116 Cellulitis of left lower limb S81.811A Laceration without foreign body, right lower leg, initial encounter Wound Cleansing Wound #1 Left,Lateral Lower Leg o Clean wound with Normal Saline. o Cleanse wound with mild soap and water Wound #2 Right,Anterior Lower Leg o Clean wound with Normal Saline. o Cleanse wound with mild soap and water Anesthetic Wound #1 Left,Lateral Lower Leg o Topical Lidocaine 4% cream applied to wound bed prior to debridement - for clinic use Wound #2 Right,Anterior Lower Leg o Topical Lidocaine 4% cream applied to wound bed prior to debridement - for clinic use Skin Barriers/Peri-Wound Care Wound #1 Left,Lateral Lower Leg o Moisturizing lotion - not on wound Wound #2 Right,Anterior Lower Leg o Moisturizing lotion - not on wound Primary Wound Dressing Wound #1 Left,Lateral Lower Leg o Aquacel Ag - rope, lightly pack single layer into undermining [place], [name] ([id number]) Wound #2 Right,Anterior Lower Leg o Other: - steri-strips-allow them to fall off on their own Secondary Dressing Wound #1 Left,Lateral Lower Leg o ABD pad Wound #2 Right,Anterior Lower Leg o Boardered Foam Dressing Dressing Change Frequency Wound #1 Left,Lateral Lower Leg o Three times weekly Wound #2 Right,Anterior Lower Leg o Other: - leave alone. Follow-up Appointments Wound #1 Left,Lateral Lower Leg o Return Appointment in 1 week. Wound #2 Right,Anterior Lower Leg o Return Appointment in 1 week. Edema Control Wound #1 Left,Lateral Lower Leg o Kerlix and Coban - Left Lower Extremity - 3cm from toes to  3 cm from knee; 1 layer of paste to anchor o Elevate legs to the level of the heart and pump ankles as often  as possible Additional Orders / Instructions Wound #1 Left,Lateral Lower Leg o Increase protein intake. Wound #2 Right,Anterior Lower Leg o Increase protein intake. Home Health Wound #1 Left,Lateral Lower Leg o Continue Home Health Visits o Home Health Nurse may visit PRN to address patientos wound care needs. o FACE TO FACE ENCOUNTER: MEDICARE and MEDICAID PATIENTS: I certify that this patient is under my care and that I had a face-to-face encounter that meets the physician face-to-face encounter requirements with this patient on this date. The encounter with the patient was in whole or in part for the following MEDICAL CONDITION: (primary reason for Home Healthcare) MEDICAL NECESSITY: I certify, that based on my findings, NURSING services are a medically Kizzie BaneHUGHES, [name] ([id number]) necessary home health service. HOME BOUND STATUS: I certify that my clinical findings support that this patient is homebound (i.e., Due to illness or injury, pt requires aid of supportive devices such as crutches, cane, wheelchairs, walkers, the use of special transportation or the assistance of another person to leave their place of residence. There is a normal inability to leave the home and doing so requires considerable and taxing effort. Other absences are for medical reasons / religious services and are infrequent or of short duration when for other reasons). o If current dressing causes regression in wound condition, may D/C ordered dressing product/s and apply Normal Saline Moist Dressing daily until next Wound Healing Center / Other MD appointment. Notify Wound Healing Center of regression in wound condition at [phone]. o Please direct any NON-WOUND related issues/requests for orders to patient's Primary Care Physician Notes I recommend that we keep the current  Steri-Strips in place for the next week and we will remove them at next week's office. We will also continue to pack the left lateral ankle wound with the silver alginate dressing although I do suggest just a single layer of the alginate into the 12 o'clock undermining/tunnel region. This will prevent over packing. If anything worsens in the interim to contact our office for additional recommendations. Electronic Signature(s) Signed: 10/28/2016 4:47:40 PM By: Lenda KelpStone III, Hoyt PA-C Entered By: Lenda KelpStone III, Hoyt on 10/28/2016 16:35:32 [name] ([id number]) -------------------------------------------------------------------------------- Problem List Details Patient Name: Trevor LackHUGHES, [name] Date of Service: 10/28/2016 3:30 PM Medical Record Number: [id number] Patient Account Number: [id number] Date of Birth/Sex: 11-09-1940 (75 y.o. Male) Treating RN: Huel CoventryWoody, Kim Primary Care Provider: Georgann HousekeeperHUSAIN, KARRAR Other Clinician: Referring Provider: Georgann HousekeeperHUSAIN, KARRAR Treating Provider/Extender: Lenda KelpSTONE III, HOYT Weeks in Treatment: 6 Active Problems ICD-10 Encounter Code Description Active Date Diagnosis S81.812A Laceration without foreign body, left lower leg, initial 09/15/2016 Yes encounter M70.862 Other soft tissue disorders related to use, overuse and 09/15/2016 Yes pressure, left lower leg I89.0 Lymphedema, not elsewhere classified 09/15/2016 Yes 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 L03.116 Cellulitis of left lower limb 09/15/2016 Yes S81.811A Laceration without foreign body, right lower leg, initial 10/21/2016 Yes encounter Inactive Problems Resolved Problems Electronic Signature(s) Signed: 10/28/2016 4:47:40 PM By: Lenda KelpStone III, Hoyt PA-C Entered By: Lenda KelpStone III, Hoyt on 10/28/2016 16:33:29 [place], [name] ([id number]) -------------------------------------------------------------------------------- Progress Note Details Patient Name: Trevor LackHUGHES, [name] Date of Service:  10/28/2016 3:30 PM Medical Record Number: [id number] Patient Account Number: [id number] Date of Birth/Sex: 11-09-1940 [age] y.o. Male) Treating RN: Huel CoventryWoody, Kim Primary Care Provider: Georgann HousekeeperHUSAIN, KARRAR Other Clinician: Referring Provider: Georgann HousekeeperHUSAIN, KARRAR Treating Provider/Extender: Linwood DibblesSTONE III, HOYT Weeks in Treatment: 6 Subjective Chief Complaint Information obtained from Patient Patient seen for complaints of Non-Healing Wound left lower lateral leg History of Present  Illness (HPI) The following HPI elements were documented for the patient's wound: Location: left lower lateral leg Quality: Patient reports experiencing a sharp pain to affected area(s). Severity: Patient states wound are getting worse. Duration: Patient has had the wound for < 4 weeks prior to presenting for treatment Timing: Pain in wound is Intermittent (comes and goes Context: The wound occurred when the patient tripped and had a fall and injured his left leg Modifying Factors: Other treatment(s) tried include:had gone to an urgent care couple of times a day taken an x-ray and was put on antibiotics Associated Signs and Symptoms: Patient reports having increase swelling and redness to the leg 76 year old gentleman who had a injury to the left ankle and foot 3 weeks ago was seen recently in the ER after he had swelling and redness of the left ankle. He is also known to have a flareup of gout recently. After he was examined in the urgent care he was placed on Keflex and Bactrim and referred to the wound center. A recent x-ray of the left ankle did not show any fractures or abnormality. The patient doesn't take very good care of his health has never been worked up for chronic lymphedema which she's had and sees a physician about once a year. He has not been aware that he has bilateral lower extremity lymphedema. he does not smoke. He has never had a workup for his lymphedema. 09/29/2016 -- the patient's insurance will not cover the  snap vacuum system and he is finding it difficult to get home health as his copayment is $25 each time. He will not be able to afford the [employer] wound VAC either. 10/14/16 patient presents today for fault evaluation concerning his left lateral ankle wound. Unfortunately he has a skin tear which occurred on Monday of this week over the interior shin of his right lower extremity. Fortunately he did we approximate the flap as well as she could and I think this has done fairly well. Nonetheless he continues to have some discomfort at this site which is completely understandable. He is concerned about how well this is going to heal hopefully being that he did catch it early this will heal up quickly. He did question how long this would take I explained that I cannot give him an exact time but hopefully it will not be too long especially since the undermining seems to be filling in rapidly. 10/21/2016 -- besides the fall and the lacerated wound on his right lateral calf the rest of his health has [place], [name] ([id number]) been going okay and I have reviewed his wounds and made appropriate changes 10/28/16 on evaluation today patient appears to be doing well in regard to the left lateral ankle wound. His right anterior shin wound still had Steri-Strips in place so it appears to be doing well. Fortunately there's no evidence of infection. Objective Constitutional Well-nourished and well-hydrated in no acute distress. Vitals Time Taken: 3:31 PM, Height: 72 in, Weight: 236 lbs, BMI: 32, Temperature: 98.1 F, Pulse: 73 bpm, Respiratory Rate: 16 breaths/min, Blood Pressure: 144/68 mmHg. Respiratory normal breathing without difficulty. clear to auscultation bilaterally. Cardiovascular regular rate and rhythm with normal S1, S2. Psychiatric this patient is able to make decisions and demonstrates good insight into disease process. Alert and Oriented x 3. pleasant and cooperative. General Notes: Patient had a  net granulation bed in regard to left lower extremity and shows no evidence of infection. Steri-Strips are still in place over the right anterior shin  in this appears to be healing well. Integumentary (Hair, Skin) Wound #1 status is Open. Original cause of wound was Trauma. The wound is located on the Left,Lateral Lower Leg. The wound measures 2.6cm length x 1.5cm width x 0.4cm depth; 3.063cm^2 area and 1.225cm^3 volume. There is Fat Layer (Subcutaneous Tissue) Exposed exposed. There is no tunneling noted, however, there is undermining starting at 10:00 and ending at 2:00 with a maximum distance of 0.5cm. There is a large amount of serosanguineous drainage noted. The wound margin is epibole. There is medium (34-66%) granulation within the wound bed. There is a medium (34-66%) amount of necrotic tissue within the wound bed including Adherent Slough. The periwound skin appearance exhibited: Induration, Erythema. The surrounding wound skin color is noted with erythema which is circumferential. Periwound temperature was noted as No Abnormality. The periwound has tenderness on palpation. Wound #2 status is Open. Original cause of wound was Trauma. The wound is located on the Right,Anterior Lower Leg. The wound measures 5cm length x 0.4cm width x 0.1cm depth; 1.571cm^2 area [place], [name] ([id number]) and 0.157cm^3 volume. There is a large amount of serosanguineous drainage noted. The wound margin is distinct with the outline attached to the wound base. There is medium (34-66%) granulation within the wound bed. There is a medium (34-66%) amount of necrotic tissue within the wound bed including Adherent Slough. The periwound skin appearance exhibited: Erythema. The surrounding wound skin color is noted with erythema which is circumferential. Periwound temperature was noted as No Abnormality. The periwound has tenderness on palpation. General Notes: Covered with steri-strips unable to  visualize Assessment Active Problems ICD-10 S81.812A - Laceration without foreign body, left lower leg, initial encounter M70.862 - Other soft tissue disorders related to use, overuse and pressure, left lower leg I89.0 - Lymphedema, not elsewhere classified L97.322 - Non-pressure chronic ulcer of left ankle with fat layer exposed L03.116 - Cellulitis of left lower limb S81.811A - Laceration without foreign body, right lower leg, initial encounter Plan Wound Cleansing: Wound #1 Left,Lateral Lower Leg: Clean wound with Normal Saline. Cleanse wound with mild soap and water Wound #2 Right,Anterior Lower Leg: Clean wound with Normal Saline. Cleanse wound with mild soap and water Anesthetic: Wound #1 Left,Lateral Lower Leg: Topical Lidocaine 4% cream applied to wound bed prior to debridement - for clinic use Wound #2 Right,Anterior Lower Leg: Topical Lidocaine 4% cream applied to wound bed prior to debridement - for clinic use Skin Barriers/Peri-Wound Care: Wound #1 Left,Lateral Lower Leg: Moisturizing lotion - not on wound Wound #2 Right,Anterior Lower Leg: Moisturizing lotion - not on wound Primary Wound Dressing: Wound #1 Left,Lateral Lower Leg: Aquacel Ag - rope, lightly pack single layer into undermining [place], [name] ([id number]) Wound #2 Right,Anterior Lower Leg: Other: - steri-strips-allow them to fall off on their own Secondary Dressing: Wound #1 Left,Lateral Lower Leg: ABD pad Wound #2 Right,Anterior Lower Leg: Boardered Foam Dressing Dressing Change Frequency: Wound #1 Left,Lateral Lower Leg: Three times weekly Wound #2 Right,Anterior Lower Leg: Other: - leave alone. Follow-up Appointments: Wound #1 Left,Lateral Lower Leg: Return Appointment in 1 week. Wound #2 Right,Anterior Lower Leg: Return Appointment in 1 week. Edema Control: Wound #1 Left,Lateral Lower Leg: Kerlix and Coban - Left Lower Extremity - 3cm from toes to 3 cm from knee; 1 layer of paste to  anchor Elevate legs to the level of the heart and pump ankles as often as possible Additional Orders / Instructions: Wound #1 Left,Lateral Lower Leg: Increase protein intake. Wound #2 Right,Anterior Lower Leg: Increase  protein intake. Home Health: Wound #1 Left,Lateral Lower Leg: Continue Home Health Visits Home Health Nurse may visit PRN to address patient s wound care needs. FACE TO FACE ENCOUNTER: MEDICARE and MEDICAID PATIENTS: I certify that this patient is under my care and that I had a face-to-face encounter that meets the physician face-to-face encounter requirements with this patient on this date. The encounter with the patient was in whole or in part for the following MEDICAL CONDITION: (primary reason for Home Healthcare) MEDICAL NECESSITY: I certify, that based on my findings, NURSING services are a medically necessary home health service. HOME BOUND STATUS: I certify that my clinical findings support that this patient is homebound (i.e., Due to illness or injury, pt requires aid of supportive devices such as crutches, cane, wheelchairs, walkers, the use of special transportation or the assistance of another person to leave their place of residence. There is a normal inability to leave the home and doing so requires considerable and taxing effort. Other absences are for medical reasons / religious services and are infrequent or of short duration when for other reasons). If current dressing causes regression in wound condition, may D/C ordered dressing product/s and apply Normal Saline Moist Dressing daily until next Wound Healing Center / Other MD appointment. Notify Wound Healing Center of regression in wound condition at [phone]. Please direct any NON-WOUND related issues/requests for orders to patient's Primary Care Physician General Notes: I recommend that we keep the current Steri-Strips in place for the next week and we will remove them at next week's office. We will  also continue to pack the left lateral ankle wound with the silver alginate dressing although I do suggest just a single layer of the alginate into the 12 o'clock undermining/tunnel region. This will prevent over packing. If anything worsens in the interim to contact our office for additional recommendations. [place], [name] ([id number]) Electronic Signature(s) Signed: 10/28/2016 4:47:40 PM By: Lenda Kelp PA-C Entered By: Lenda Kelp on 10/28/2016 16:35:46 [place], [name] ([id number]) -------------------------------------------------------------------------------- SuperBill Details Patient Name: [name] Date of Service: 10/28/2016 Medical Record Number: [id number] Patient Account Number: [id number] Date of Birth/Sex: February 03, 1941 (75 y.o. Male) Treating RN: Huel Coventry Primary Care Provider: Georgann Housekeeper Other Clinician: Referring Provider: Georgann Housekeeper Treating Provider/Extender: Linwood Dibbles, HOYT Weeks in Treatment: 6 Diagnosis Coding ICD-10 Codes Code Description [phone] Laceration without foreign body, left lower leg, initial encounter M70.862 Other soft tissue disorders related to use, overuse and pressure, left lower leg I89.0 Lymphedema, not elsewhere classified L97.322 Non-pressure chronic ulcer of left ankle with fat layer exposed L03.116 Cellulitis of left lower limb S81.811A Laceration without foreign body, right lower leg, initial encounter Facility Procedures CPT4 Code: [id number] Description: 99214 - WOUND CARE VISIT-LEV 4 EST PT Modifier: Quantity: 1 Physician Procedures CPT4: Description Modifier Quantity Code [id number] 99213 - WC PHYS LEVEL 3 - EST PT 1 ICD-10 Description Diagnosis S81.812A Laceration without foreign body, left lower leg, initial encounter M70.862 Other soft tissue disorders related to use, overuse and  pressure, left lower leg I89.0 Lymphedema, not elsewhere classified L97.322 Non-pressure chronic ulcer of left ankle with fat layer  exposed Electronic Signature(s) Signed: 10/28/2016 5:04:50 PM By: Elliot Gurney, BSN, RN, CWS, Kim RN, BSN Signed: 10/28/2016 5:50:43 PM By: Lenda Kelp PA-C Previous Signature: 10/28/2016 4:47:40 PM Version By: Lenda Kelp PA-C Entered By: Elliot Gurney, BSN, RN, CWS, Kim on 10/28/2016 17:04:49

## 2016-10-31 DIAGNOSIS — E6609 Other obesity due to excess calories: Secondary | ICD-10-CM | POA: Diagnosis not present

## 2016-10-31 DIAGNOSIS — Z791 Long term (current) use of non-steroidal anti-inflammatories (NSAID): Secondary | ICD-10-CM | POA: Diagnosis not present

## 2016-10-31 DIAGNOSIS — I10 Essential (primary) hypertension: Secondary | ICD-10-CM | POA: Diagnosis not present

## 2016-10-31 DIAGNOSIS — I890 Lymphedema, not elsewhere classified: Secondary | ICD-10-CM | POA: Diagnosis not present

## 2016-10-31 DIAGNOSIS — S81812D Laceration without foreign body, left lower leg, subsequent encounter: Secondary | ICD-10-CM | POA: Diagnosis not present

## 2016-10-31 DIAGNOSIS — Z6832 Body mass index (BMI) 32.0-32.9, adult: Secondary | ICD-10-CM | POA: Diagnosis not present

## 2016-10-31 DIAGNOSIS — L03116 Cellulitis of left lower limb: Secondary | ICD-10-CM | POA: Diagnosis not present

## 2016-10-31 DIAGNOSIS — M109 Gout, unspecified: Secondary | ICD-10-CM | POA: Diagnosis not present

## 2016-10-31 DIAGNOSIS — Z87891 Personal history of nicotine dependence: Secondary | ICD-10-CM | POA: Diagnosis not present

## 2016-11-01 DIAGNOSIS — R0609 Other forms of dyspnea: Secondary | ICD-10-CM | POA: Diagnosis not present

## 2016-11-01 DIAGNOSIS — Z87898 Personal history of other specified conditions: Secondary | ICD-10-CM | POA: Diagnosis not present

## 2016-11-01 DIAGNOSIS — L03116 Cellulitis of left lower limb: Secondary | ICD-10-CM | POA: Diagnosis not present

## 2016-11-01 DIAGNOSIS — I890 Lymphedema, not elsewhere classified: Secondary | ICD-10-CM | POA: Diagnosis not present

## 2016-11-01 DIAGNOSIS — I10 Essential (primary) hypertension: Secondary | ICD-10-CM | POA: Diagnosis not present

## 2016-11-01 DIAGNOSIS — R002 Palpitations: Secondary | ICD-10-CM | POA: Diagnosis not present

## 2016-11-01 DIAGNOSIS — S81812D Laceration without foreign body, left lower leg, subsequent encounter: Secondary | ICD-10-CM | POA: Diagnosis not present

## 2016-11-01 DIAGNOSIS — R9431 Abnormal electrocardiogram [ECG] [EKG]: Secondary | ICD-10-CM | POA: Insufficient documentation

## 2016-11-01 DIAGNOSIS — R600 Localized edema: Secondary | ICD-10-CM | POA: Diagnosis not present

## 2016-11-03 ENCOUNTER — Encounter: Payer: PPO | Admitting: Physician Assistant

## 2016-11-03 DIAGNOSIS — S81812A Laceration without foreign body, left lower leg, initial encounter: Secondary | ICD-10-CM | POA: Diagnosis not present

## 2016-11-03 DIAGNOSIS — S81802A Unspecified open wound, left lower leg, initial encounter: Secondary | ICD-10-CM | POA: Diagnosis not present

## 2016-11-03 DIAGNOSIS — S81801A Unspecified open wound, right lower leg, initial encounter: Secondary | ICD-10-CM | POA: Diagnosis not present

## 2016-11-04 DIAGNOSIS — D649 Anemia, unspecified: Secondary | ICD-10-CM | POA: Diagnosis not present

## 2016-11-05 NOTE — Progress Notes (Signed)
DOW, BLAHNIK ([id number]) Visit Report for 11/03/2016 Arrival Information Details Patient Name: [name], [name] Date of Service: 11/03/2016 2:30 PM Medical Record Number: [id number] Patient Account Number: [id number] Date of Birth/Sex: 1941/03/07 (76 y.o. Male) Treating RN: Huel Coventry Primary Care Amari Zagal: Georgann Housekeeper Other Clinician: Referring Alexea Blase: Georgann Housekeeper Treating Takenya Travaglini/Extender: Linwood Dibbles, HOYT Weeks in Treatment: 7 Visit Information History Since Last Visit Added or deleted any medications: No Patient Arrived: [name] Any new allergies or adverse reactions: No Arrival Time: 14:31 Had a fall or experienced change in No Accompanied By: self activities of daily living that may affect Transfer Assistance: None risk of falls: Patient Identification Verified: Yes Signs or symptoms of abuse/neglect No Secondary Verification Process Yes since last visito Completed: Hospitalized since last visit: No Patient Requires Transmission- No Has Dressing in Place as Prescribed: Yes Based Precautions: Has Compression in Place as Yes Patient Has Alerts: Yes Prescribed: Patient Alerts: L ABI non- Has Footwear/Offloading in Place as Yes compressible Prescribed: R ABI non- Left: Surgical Shoe with compressible Pressure Relief Insole Right: Surgical Shoe with Pressure Relief Insole Pain Present Now: No Electronic Signature(s) Signed: 11/03/2016 5:03:07 PM By: Elliot Gurney, BSN, RN, CWS, Kim RN, BSN Entered By: Elliot Gurney, BSN, RN, CWS, Kim on 11/03/2016 14:32:20 [place], [name] ([id number]) -------------------------------------------------------------------------------- Clinic Level of Care Assessment Details Patient Name: [name] Date of Service: 11/03/2016 2:30 PM Medical Record Number: [id number] Patient Account Number: [id number] Date of Birth/Sex: 16-Oct-1940 (76 y.o. Male) Treating RN: Huel Coventry Primary Care Kanyia Heaslip: Georgann Housekeeper Other Clinician: Referring Nathanial Arrighi: Georgann Housekeeper Treating Jude Naclerio/Extender: Linwood Dibbles, HOYT Weeks in Treatment: 7 Clinic Level of Care Assessment Items TOOL 4 Quantity Score []  - Use when only an EandM is performed on FOLLOW-UP visit 0 ASSESSMENTS - Nursing Assessment / Reassessment []  - Reassessment of Co-morbidities (includes updates in patient status) 0 X - Reassessment of Adherence to Treatment Plan 1 5 ASSESSMENTS - Wound and Skin Assessment / Reassessment X - Simple Wound Assessment / Reassessment - one wound 1 5 []  - Complex Wound Assessment / Reassessment - multiple wounds 0 []  - Dermatologic / Skin Assessment (not related to wound area) 0 ASSESSMENTS - Focused Assessment []  - Circumferential Edema Measurements - multi extremities 0 []  - Nutritional Assessment / Counseling / Intervention 0 []  - Lower Extremity Assessment (monofilament, tuning fork, pulses) 0 []  - Peripheral Arterial Disease Assessment (using hand held doppler) 0 ASSESSMENTS - Ostomy and/or Continence Assessment and Care []  - Incontinence Assessment and Management 0 []  - Ostomy Care Assessment and Management (repouching, etc.) 0 PROCESS - Coordination of Care X - Simple Patient / Family Education for ongoing care 1 15 []  - Complex (extensive) Patient / Family Education for ongoing care 0 []  - Staff obtains Chiropractor, Records, Test Results / Process Orders 0 []  - Staff telephones HHA, Nursing Homes / Clarify orders / etc 0 []  - Routine Transfer to another Facility (non-emergent condition) 0 Bertagnolli, [name] ([id number]) []  - Routine Hospital Admission (non-emergent condition) 0 []  - New Admissions / Manufacturing engineer / Ordering NPWT, Apligraf, etc. 0 []  - Emergency Hospital Admission (emergent condition) 0 X - Simple Discharge Coordination 1 10 []  - Complex (extensive) Discharge Coordination 0 PROCESS - Special Needs []  - Pediatric / Minor Patient Management 0 []  - Isolation Patient Management 0 []  - Hearing / Language / Visual special needs 0 []   - Assessment of Community assistance (transportation, D/C planning, etc.) 0 []  - Additional assistance / Altered mentation 0 []  - Support Surface(s) Assessment (bed, cushion,  seat, etc.) 0 INTERVENTIONS - Wound Cleansing / Measurement X - Simple Wound Cleansing - one wound 1 5 []  - Complex Wound Cleansing - multiple wounds 0 X - Wound Imaging (photographs - any number of wounds) 1 5 []  - Wound Tracing (instead of photographs) 0 X - Simple Wound Measurement - one wound 1 5 []  - Complex Wound Measurement - multiple wounds 0 INTERVENTIONS - Wound Dressings []  - Small Wound Dressing one or multiple wounds 0 X - Medium Wound Dressing one or multiple wounds 1 15 []  - Large Wound Dressing one or multiple wounds 0 []  - Application of Medications - topical 0 []  - Application of Medications - injection 0 INTERVENTIONS - Miscellaneous []  - External ear exam 0 Esteban, [name] ([id number]) []  - Specimen Collection (cultures, biopsies, blood, body fluids, etc.) 0 []  - Specimen(s) / Culture(s) sent or taken to Lab for analysis 0 []  - Patient Transfer (multiple staff / Nurse, adultHoyer Lift / Similar devices) 0 []  - Simple Staple / Suture removal (25 or less) 0 []  - Complex Staple / Suture removal (26 or more) 0 []  - Hypo / Hyperglycemic Management (close monitor of Blood Glucose) 0 []  - Ankle / Brachial Index (ABI) - do not check if billed separately 0 X - Vital Signs 1 5 Has the patient been seen at the hospital within the last three years: Yes Total Score: 70 Level Of Care: New/Established - Level 2 Electronic Signature(s) Signed: 11/03/2016 5:03:07 PM By: Elliot GurneyWoody, BSN, RN, CWS, Kim RN, BSN Entered By: Elliot GurneyWoody, BSN, RN, CWS, Kim on 11/03/2016 15:07:49 KosseHUGHES, [name] ([id number]) -------------------------------------------------------------------------------- Encounter Discharge Information Details Patient Name: Trevor LackHUGHES, [name] Date of Service: 11/03/2016 2:30 PM Medical Record Number: [id number] Patient Account  Number: [id number] Date of Birth/Sex: 11-May-1940 (75 y.o. Male) Treating RN: Huel CoventryWoody, Kim Primary Care Donterrius Santucci: Georgann HousekeeperHUSAIN, KARRAR Other Clinician: Referring Kenndra Morris: Georgann HousekeeperHUSAIN, KARRAR Treating Catherin Doorn/Extender: Linwood DibblesSTONE III, HOYT Weeks in Treatment: 7 Encounter Discharge Information Items Discharge Pain Level: 0 Discharge Condition: Stable Ambulatory Status: Cane Discharge Destination: Home Transportation: Other Accompanied By: self Schedule Follow-up Appointment: Yes Medication Reconciliation completed Yes and provided to Patient/Care Nicky Kras: Provided on Clinical Summary of Care: 11/03/2016 Form Type Recipient Paper Patient MH Electronic Signature(s) Signed: 11/03/2016 3:15:43 PM By: Elliot GurneyWoody, BSN, RN, CWS, Kim RN, BSN Previous Signature: 11/03/2016 3:05:21 PM Version By: Gwenlyn PerkingMoore, Shelia Entered By: Elliot GurneyWoody, BSN, RN, CWS, Kim on 11/03/2016 15:15:43 [place], [name] ([id number]) -------------------------------------------------------------------------------- Lower Extremity Assessment Details Patient Name: Trevor LackHUGHES, [name] Date of Service: 11/03/2016 2:30 PM Medical Record Number: [id number] Patient Account Number: [id number] Date of Birth/Sex: 11-May-1940 (75 y.o. Male) Treating RN: Huel CoventryWoody, Kim Primary Care Zamier Eggebrecht: Georgann HousekeeperHUSAIN, KARRAR Other Clinician: Referring Mayerli Kirst: Georgann HousekeeperHUSAIN, KARRAR Treating Chapman Matteucci/Extender: Linwood DibblesSTONE III, HOYT Weeks in Treatment: 7 Edema Assessment Assessed: [Left: No] [Right: No] E[Left: dema] [Right: :] Calf Left: Right: Point of Measurement: 35 cm From Medial Instep 36.5 cm cm Ankle Left: Right: Point of Measurement: 13 cm From Medial Instep 27 cm cm Vascular Assessment Pulses: Dorsalis Pedis Palpable: [Left:Yes] [Right:Yes] Posterior Tibial Extremity colors, hair growth, and conditions: Extremity Color: [Left:Normal] [Right:Normal] Hair Growth on Extremity: [Left:No] [Right:No] Temperature of Extremity: [Left:Warm] [Right:Warm] Capillary Refill: [Left:< 3 seconds]  [Right:< 3 seconds] Dependent Rubor: [Left:No] [Right:No] Blanched when Elevated: [Left:No] [Right:No] Lipodermatosclerosis: [Left:No] [Right:No] Toe Nail Assessment Left: Right: Thick: No No Discolored: Yes Yes Deformed: Yes Yes Improper Length and Hygiene: Yes Yes Electronic Signature(s) Signed: 11/03/2016 5:03:07 PM By: Elliot GurneyWoody, BSN, RN, CWS, Kim RN, BSN QuitmanHUGHES, [name] ([id number]) Entered By: Elliot GurneyWoody, BSN, RN, CWS,  Kim on 11/03/2016 14:43:41 ISADORE, [name] ([id number]) -------------------------------------------------------------------------------- Multi Wound Chart Details Patient Name: NOAM, KARAFFA Date of Service: 11/03/2016 2:30 PM Medical Record Number: [id number] Patient Account Number: [id number] Date of Birth/Sex: Oct 31, 1940 (76 y.o. Male) Treating RN: Huel Coventry Primary Care Sausha Raymond: Georgann Housekeeper Other Clinician: Referring Felipe Cabell: Georgann Housekeeper Treating Mercer Stallworth/Extender: Linwood Dibbles, HOYT Weeks in Treatment: 7 Vital Signs Height(in): 72 Pulse(bpm): 64 Weight(lbs): 236 Blood Pressure 137/63 (mmHg): Body Mass Index(BMI): 32 Temperature(F): 97.8 Respiratory Rate 16 (breaths/min): Photos: [N/A:N/A] Wound Location: Left Lower Leg - Lateral Right Lower Leg - Anterior N/A Wounding Event: Trauma Trauma N/A Primary Etiology: Trauma, Other Venous Leg Ulcer N/A Secondary Etiology: N/A Trauma, Other N/A Comorbid History: Cataracts, Hypertension, Cataracts, Hypertension, N/A Gout Gout Date Acquired: 08/25/2016 10/10/2016 N/A Weeks of Treatment: 7 2 N/A Wound Status: Open Open N/A Measurements L x W x D 2.1x1.4x0.4 0.5x0.1x0.1 N/A (cm) Area (cm) : 2.309 0.039 N/A Volume (cm) : 0.924 0.004 N/A % Reduction in Area: 30.80% 98.30% N/A % Reduction in Volume: -176.60% 98.20% N/A Starting Position 1 12 (o'clock): Ending Position 1 1 (o'clock): Maximum Distance 1 0.5 (cm): Undermining: Yes No N/A Classification: Partial Thickness N/A Fraga, [name] ([id number]) Full  Thickness Without Exposed Support Structures Exudate Amount: Medium Large N/A Exudate Type: Serosanguineous Serosanguineous N/A Exudate Color: red, brown red, brown N/A Wound Margin: Epibole Distinct, outline attached N/A Granulation Amount: Large (67-100%) Large (67-100%) N/A Granulation Quality: Red, Hyper-granulation Red N/A Necrotic Amount: Small (1-33%) Small (1-33%) N/A Necrotic Tissue: Adherent Slough Eschar N/A Exposed Structures: Fat Layer (Subcutaneous Fascia: No N/A Tissue) Exposed: Yes Fat Layer (Subcutaneous Fascia: No Tissue) Exposed: No Tendon: No Tendon: No Muscle: No Muscle: No Joint: No Joint: No Bone: No Bone: No Limited to Skin Breakdown Epithelialization: None Large (67-100%) N/A Periwound Skin Texture: Induration: Yes No Abnormalities Noted N/A Periwound Skin No Abnormalities Noted No Abnormalities Noted N/A Moisture: Periwound Skin Color: Erythema: Yes Erythema: Yes N/A Erythema Location: Circumferential Circumferential N/A Erythema Change: Decreased N/A N/A Temperature: No Abnormality No Abnormality N/A Tenderness on Yes Yes N/A Palpation: Wound Preparation: Ulcer Cleansing: Ulcer Cleansing: N/A Rinsed/Irrigated with Rinsed/Irrigated with Saline, Other: soap and Saline water Topical Anesthetic Topical Anesthetic Applied: None Applied: Other: lidocaine 4% Treatment Notes Electronic Signature(s) Signed: 11/03/2016 5:03:07 PM By: Elliot Gurney, BSN, RN, CWS, Kim RN, BSN Entered By: Elliot Gurney, BSN, RN, CWS, Kim on 11/03/2016 14:45:51 [place]ark[place], Kathlene November ([id number]) -------------------------------------------------------------------------------- Multi-Disciplinary Care Plan Details Patient Name: [name] Date of Service: 11/03/2016 2:30 PM Medical Record Number: [id number] Patient Account Number: [id number] Date of Birth/Sex: 07-Mar-1941 (76 y.o. Male) Treating RN: Huel Coventry Primary Care Ozias Dicenzo: Georgann Housekeeper Other Clinician: Referring Sakia Schrimpf: Georgann Housekeeper Treating Gordie Crumby/Extender: Linwood Dibbles, HOYT Weeks in Treatment: 7 Active Inactive ` Abuse / Safety / Falls / Self Care Management Nursing Diagnoses: History of Falls Potential for falls Goals: Patient will remain injury free related to falls Date Initiated: 09/15/2016 Target Resolution Date: 11/26/2016 Goal Status: Active Interventions: Assess fall risk on admission and as needed Assess impairment of mobility on admission and as needed per policy Notes: ` Nutrition Nursing Diagnoses: Imbalanced nutrition Potential for alteratiion in Nutrition/Potential for imbalanced nutrition Goals: Patient/caregiver agrees to and verbalizes understanding of need to use nutritional supplements and/or vitamins as prescribed Date Initiated: 09/15/2016 Target Resolution Date: 12/31/2016 Goal Status: Active Interventions: Assess patient nutrition upon admission and as needed per policy Notes: ` Orientation to the [facility] [place], [place] ([id number]) Nursing Diagnoses: Knowledge deficit related to the wound healing center program Goals:  Patient/caregiver will verbalize understanding of the Wound Healing Center Program Date Initiated: 09/15/2016 Target Resolution Date: 10/01/2016 Goal Status: Active Interventions: Provide education on orientation to the wound center Notes: ` Pain, Acute or Chronic Nursing Diagnoses: Pain, acute or chronic: actual or potential Potential alteration in comfort, pain Goals: Patient/caregiver will verbalize adequate pain control between visits Date Initiated: 09/15/2016 Target Resolution Date: 12/31/2016 Goal Status: Active Interventions: Complete pain assessment as per visit requirements Notes: ` Wound/Skin Impairment Nursing Diagnoses: Impaired tissue integrity Knowledge deficit related to ulceration/compromised skin integrity Goals: Ulcer/skin breakdown will have a volume reduction of 80% by week 12 Date Initiated: 09/15/2016 Target  Resolution Date: 12/24/2016 Goal Status: Active Interventions: Assess patient/caregiver ability to perform ulcer/skin care regimen upon admission and as needed Notes: DENHAM, MOSE ([id number]) Electronic Signature(s) Signed: 11/03/2016 5:03:07 PM By: Elliot Gurney, BSN, RN, CWS, Kim RN, BSN Entered By: Elliot Gurney, BSN, RN, CWS, Kim on 11/03/2016 14:44:40 [place], [name] ([id number]) -------------------------------------------------------------------------------- Pain Assessment Details Patient Name: [name] Date of Service: 11/03/2016 2:30 PM Medical Record Number: [id number] Patient Account Number: [id number] Date of Birth/Sex: 02-06-1941 (75 y.o. Male) Treating RN: Huel Coventry Primary Care Lucille Witts: Georgann Housekeeper Other Clinician: Referring Tzion Wedel: Georgann Housekeeper Treating Teodora Baumgarten/Extender: Linwood Dibbles, HOYT Weeks in Treatment: 7 Active Problems Location of Pain Severity and Description of Pain Patient Has Paino No Site Locations With Dressing Change: No Pain Management and Medication Current Pain Management: Electronic Signature(s) Signed: 11/03/2016 5:03:07 PM By: Elliot Gurney, BSN, RN, CWS, Kim RN, BSN Entered By: Elliot Gurney, BSN, RN, CWS, Kim on 11/03/2016 14:32:27 [place], Kathlene November ([id number]) -------------------------------------------------------------------------------- Patient/Caregiver Education Details Patient Name: [name] Date of Service: 11/03/2016 2:30 PM Medical Record Number: [id number] Patient Account Number: [id number] Date of Birth/Gender: September 28, 1940 (75 y.o. Male) Treating RN: Huel Coventry Primary Care Physician: Georgann Housekeeper Other Clinician: Referring Physician: Georgann Housekeeper Treating Physician/Extender: Skeet Simmer in Treatment: 7 Education Assessment Education Provided To: Patient Education Topics Provided Wound/Skin Impairment: Handouts: Caring for Your Ulcer Methods: Demonstration Responses: State content correctly Electronic Signature(s) Signed: 11/03/2016  5:03:07 PM By: Elliot Gurney, BSN, RN, CWS, Kim RN, BSN Entered By: Elliot Gurney, BSN, RN, CWS, Kim on 11/03/2016 15:15:55 [place], [name] ([id number]) -------------------------------------------------------------------------------- Wound Assessment Details Patient Name: [name] Date of Service: 11/03/2016 2:30 PM Medical Record Number: [id number] Patient Account Number: [id number] Date of Birth/Sex: [birth year] (76 y.o. Male) Treating RN: Huel Coventry Primary Care Herchel Hopkin: Georgann Housekeeper Other Clinician: Referring Wyett Narine: Georgann Housekeeper Treating Willet Schleifer/Extender: Linwood Dibbles, HOYT Weeks in Treatment: 7 Wound Status Wound Number: 1 Primary Etiology: Trauma, Other Wound Location: Left Lower Leg - Lateral Wound Status: Open Wounding Event: Trauma Comorbid History: Cataracts, Hypertension, Gout Date Acquired: 08/25/2016 Weeks Of Treatment: 7 Clustered Wound: No Photos Wound Measurements Length: (cm) 2.1 Width: (cm) 1.4 Depth: (cm) 0.4 Area: (cm) 2.309 Volume: (cm) 0.924 % Reduction in Area: 30.8% % Reduction in Volume: -176.6% Epithelialization: None Tunneling: No Undermining: Yes Starting Position (o'clock): 12 Ending Position (o'clock): 1 Maximum Distance: (cm) 0.5 Wound Description Full Thickness Without Exposed Classification: Support Structures Wound Margin: Epibole Exudate Medium Amount: Exudate Type: Serosanguineous Exudate Color: red, brown Foul Odor After Cleansing: No Slough/Fibrino Yes Wound Bed Granulation Amount: Large (67-100%) Exposed Structure Granulation Quality: Red, Hyper-granulation Fascia Exposed: No Kamath, [name] ([id number]) Necrotic Amount: Small (1-33%) Fat Layer (Subcutaneous Tissue) Exposed: Yes Necrotic Quality: Adherent Slough Tendon Exposed: No Muscle Exposed: No Joint Exposed: No Bone Exposed: No Periwound Skin Texture Texture Color No Abnormalities Noted: No No Abnormalities Noted: No Induration: Yes Erythema: Yes Erythema Location:  Circumferential Moisture Erythema Change: Decreased No Abnormalities Noted: No Temperature / Pain Temperature: No Abnormality Tenderness on Palpation: Yes Wound Preparation Ulcer Cleansing: Rinsed/Irrigated with Saline, Other: soap and water, Topical Anesthetic Applied: Other: lidocaine 4%, Treatment Notes Wound #1 (Left, Lateral Lower Leg) 1. Cleansed with: Clean wound with Normal Saline 2. Anesthetic Topical Lidocaine 4% cream to wound bed prior to debridement 4. Dressing Applied: Aquacel Ag 5. Secondary Dressing Applied ABD Pad Notes Kerlix and coban unna to Ecologist) Signed: 11/03/2016 5:03:07 PM By: Elliot Gurney, BSN, RN, CWS, Kim RN, BSN Entered By: Elliot Gurney, BSN, RN, CWS, Kim on 11/03/2016 14:39:13 [place], [name] ([id number]) -------------------------------------------------------------------------------- Wound Assessment Details Patient Name: [name] Date of Service: 11/03/2016 2:30 PM Medical Record Number: [id number] Patient Account Number: [id number] Date of Birth/Sex: [birth year] (76 y.o. Male) Treating RN: Huel Coventry Primary Care Marcos Peloso: Georgann Housekeeper Other Clinician: Referring Louanna Vanliew: Georgann Housekeeper Treating Akito Boomhower/Extender: Linwood Dibbles, HOYT Weeks in Treatment: 7 Wound Status Wound Number: 2 Primary Etiology: Venous Leg Ulcer Wound Location: Right Lower Leg - Anterior Secondary Etiology: Trauma, Other Wounding Event: Trauma Wound Status: Open Date Acquired: 10/10/2016 Comorbid History: Cataracts, Hypertension, Gout Weeks Of Treatment: 2 Clustered Wound: No Photos Wound Measurements Length: (cm) 0.5 Width: (cm) 0.1 Depth: (cm) 0.1 Area: (cm) 0.039 Volume: (cm) 0.004 % Reduction in Area: 98.3% % Reduction in Volume: 98.2% Epithelialization: Large (67-100%) Tunneling: No Undermining: No Wound Description Classification: Partial Thickness Foul Odor Aft Wound Margin: Distinct, outline attached Slough/Fibrin Exudate Amount:  Large Exudate Type: Serosanguineous Exudate Color: red, brown er Cleansing: No o No Wound Bed Granulation Amount: Large (67-100%) Exposed Structure Granulation Quality: Red Fascia Exposed: No Necrotic Amount: Small (1-33%) Fat Layer (Subcutaneous Tissue) Exposed: No Necrotic Quality: Eschar Tendon Exposed: No Muscle Exposed: No Joint Exposed: No Bone Exposed: No [place], [name] ([id number]) Limited to Skin Breakdown Periwound Skin Texture Texture Color No Abnormalities Noted: No No Abnormalities Noted: No Erythema: Yes Moisture Erythema Location: Circumferential No Abnormalities Noted: No Temperature / Pain Temperature: No Abnormality Tenderness on Palpation: Yes Wound Preparation Ulcer Cleansing: Rinsed/Irrigated with Saline Topical Anesthetic Applied: None Treatment Notes Wound #2 (Right, Anterior Lower Leg) 1. Cleansed with: Clean wound with Normal Saline 2. Anesthetic Topical Lidocaine 4% cream to wound bed prior to debridement 4. Dressing Applied: Aquacel Ag 5. Secondary Dressing Applied ABD Pad Notes Kerlix and coban unna to Ecologist) Signed: 11/03/2016 5:03:07 PM By: Elliot Gurney, BSN, RN, CWS, Kim RN, BSN Entered By: Elliot Gurney, BSN, RN, CWS, Kim on 11/03/2016 14:41:45 [place], [name] ([id number]) -------------------------------------------------------------------------------- Vitals Details Patient Name: [name] Date of Service: 11/03/2016 2:30 PM Medical Record Number: [id number] Patient Account Number: [id number] Date of Birth/Sex: [birth year] (75 y.o. Male) Treating RN: Huel Coventry Primary Care Rameses Ou: Georgann Housekeeper Other Clinician: Referring Kaylin Marcon: Georgann Housekeeper Treating Erla Bacchi/Extender: Linwood Dibbles, HOYT Weeks in Treatment: 7 Vital Signs Time Taken: 14:32 Temperature (F): 97.8 Height (in): 72 Pulse (bpm): 64 Weight (lbs): 236 Respiratory Rate (breaths/min): 16 Body Mass Index (BMI): 32 Blood Pressure (mmHg): 137/63 Reference  Range: 80 - 120 mg / dl Electronic Signature(s) Signed: 11/03/2016 5:03:07 PM By: Elliot Gurney, BSN, RN, CWS, Kim RN, BSN Entered By: Elliot Gurney, BSN, RN, CWS, Kim on 11/03/2016 14:32:44

## 2016-11-07 NOTE — Progress Notes (Signed)
MERION, GRIMALDO ([id number]) Visit Report for 11/03/2016 Chief Complaint Document Details Patient Name: [name] Date of Service: 11/03/2016 2:30 PM Medical Record Number: [id number] Patient Account Number: [id number] Date of Birth/Sex: August 22, 1940 (76 y.o. Male) Treating RN: Huel Coventry Primary Care Provider: Georgann Housekeeper Other Clinician: Referring Provider: Georgann Housekeeper Treating Provider/Extender: Linwood Dibbles, HOYT Weeks in Treatment: 7 Information Obtained from: Patient Chief Complaint Patient seen for complaints of Non-Healing Wound left lower lateral leg Electronic Signature(s) Signed: 11/03/2016 5:22:22 PM By: Lenda Kelp PA-C Entered By: Lenda Kelp on 11/03/2016 14:50:34 [place], [name] ([id number]) -------------------------------------------------------------------------------- HPI Details Patient Name: [name] Date of Service: 11/03/2016 2:30 PM Medical Record Number: [id number] Patient Account Number: [id number] Date of Birth/Sex: July 30, 1940 (75 y.o. Male) Treating RN: Huel Coventry Primary Care Provider: Georgann Housekeeper Other Clinician: Referring Provider: Georgann Housekeeper Treating Provider/Extender: Linwood Dibbles, HOYT Weeks in Treatment: 7 History of Present Illness Location: left lower lateral leg Quality: Patient reports experiencing a sharp pain to affected area(s). Severity: Patient states wound are getting worse. Duration: Patient has had the wound for < 4 weeks prior to presenting for treatment Timing: Pain in wound is Intermittent (comes and goes Context: The wound occurred when the patient tripped and had a fall and injured his left leg Modifying Factors: Other treatment(s) tried include:had gone to an urgent care couple of times a day taken an x-ray and was put on antibiotics Associated Signs and Symptoms: Patient reports having increase swelling and redness to the leg HPI Description: 76 year old gentleman who had a injury to the left ankle and foot 3 weeks  ago was seen recently in the ER after he had swelling and redness of the left ankle. He is also known to have a flareup of gout recently. After he was examined in the urgent care he was placed on Keflex and Bactrim and referred to the wound center. A recent x-ray of the left ankle did not show any fractures or abnormality. The patient doesn't take very good care of his health has never been worked up for chronic lymphedema which she's had and sees a physician about once a year. He has not been aware that he has bilateral lower extremity lymphedema. he does not smoke. He has never had a workup for his lymphedema. 09/29/2016 -- the patient's insurance will not cover the snap vacuum system and he is finding it difficult to get home health as his copayment is $25 each time. He will not be able to afford the [employer] wound VAC either. 10/14/16 patient presents today for fault evaluation concerning his left lateral ankle wound. Unfortunately he has a skin tear which occurred on Monday of this week over the interior shin of his right lower extremity. Fortunately he did we approximate the flap as well as she could and I think this has done fairly well. Nonetheless he continues to have some discomfort at this site which is completely understandable. He is concerned about how well this is going to heal hopefully being that he did catch it early this will heal up quickly. He did question how long this would take I explained that I cannot give him an exact time but hopefully it will not be too long especially since the undermining seems to be filling in rapidly. 10/21/2016 -- besides the fall and the lacerated wound on his right lateral calf the rest of his health has been going okay and I have reviewed his wounds and made appropriate changes 10/28/16 on evaluation today patient appears to  be doing well in regard to the left lateral ankle wound. His right anterior shin wound still had Steri-Strips in place so it  appears to be doing well. Fortunately there's no evidence of infection. 11/03/16 on evaluation today patient's wound on the left lateral ankle appears to be doing better visually with epithelium migrating in from the non-o'clock location as well as the undermining improving significantly. JAYESH MARBACH, [name] ([id number]) feel like this is overall doing much better. The wound over the anterior shin of his right lower extremity appears to be closed there is no active drainage at this point no evidence of fluctuance underneath and I think this is also healing well. There is no evidence of infection at either site. Electronic Signature(s) Signed: 11/03/2016 5:22:22 PM By: Lenda Kelp PA-C Entered By: Lenda Kelp on 11/03/2016 14:57:28 [place], [name] ([id number]) -------------------------------------------------------------------------------- Physical Exam Details Patient Name: [name] Date of Service: 11/03/2016 2:30 PM Medical Record Number: [id number] Patient Account Number: [id number] Date of Birth/Sex: 1941/03/28 (76 y.o. Male) Treating RN: Huel Coventry Primary Care Provider: Georgann Housekeeper Other Clinician: Referring Provider: Georgann Housekeeper Treating Provider/Extender: Linwood Dibbles, HOYT Weeks in Treatment: 7 Constitutional Well-nourished and well-hydrated in no acute distress. Respiratory normal breathing without difficulty. clear to auscultation bilaterally. Cardiovascular regular rate and rhythm with normal S1, S2. Psychiatric this patient is able to make decisions and demonstrates good insight into disease process. Alert and Oriented x 3. pleasant and cooperative. Notes Both of patient's wounds appeared to be doing well in regard to the left lateral ankle he had an excellent granulation bed noted on examination. The undermining is better and no sharp debridement was required. Electronic Signature(s) Signed: 11/03/2016 5:22:22 PM By: Lenda Kelp PA-C Entered By: Lenda Kelp  on 11/03/2016 14:58:00 [place], [name] ([id number]) -------------------------------------------------------------------------------- Physician Orders Details Patient Name: [name] Date of Service: 11/03/2016 2:30 PM Medical Record Number: [id number] Patient Account Number: [id number] Date of Birth/Sex: 01-25-1941 (75 y.o. Male) Treating RN: Huel Coventry Primary Care Provider: Georgann Housekeeper Other Clinician: Referring Provider: Georgann Housekeeper Treating Provider/Extender: Linwood Dibbles, HOYT Weeks in Treatment: 7 Verbal / Phone Orders: No Diagnosis Coding ICD-10 Coding Code Description [phone] Laceration without foreign body, left lower leg, initial encounter [phone] Other soft tissue disorders related to use, overuse and pressure, left lower leg I89.0 Lymphedema, not elsewhere classified L97.322 Non-pressure chronic ulcer of left ankle with fat layer exposed L03.116 Cellulitis of left lower limb S81.811A Laceration without foreign body, right lower leg, initial encounter Wound Cleansing Wound #1 Left,Lateral Lower Leg o Clean wound with Normal Saline. o Cleanse wound with mild soap and water Wound #2 Right,Anterior Lower Leg o Cleanse wound with mild soap and water Anesthetic Wound #1 Left,Lateral Lower Leg o Topical Lidocaine 4% cream applied to wound bed prior to debridement - for clinic use Skin Barriers/Peri-Wound Care Wound #1 Left,Lateral Lower Leg o Moisturizing lotion - not on wound Wound #2 Right,Anterior Lower Leg o Moisturizing lotion - not on wound Primary Wound Dressing Wound #1 Left,Lateral Lower Leg o Aquacel Ag Wound #2 Right,Anterior Lower Leg o Boardered Foam Dressing - for protection [place], [name] ([id number]) Secondary Dressing Wound #1 Left,Lateral Lower Leg o ABD pad Dressing Change Frequency Wound #1 Left,Lateral Lower Leg o Dressing is to be changed Monday and Thursday. Follow-up Appointments Wound #1 Left,Lateral Lower Leg o  Return Appointment in 1 week. Wound #2 Right,Anterior Lower Leg o Return Appointment in 1 week. Edema Control Wound #1 Left,Lateral Lower Leg o Kerlix and Coban -  Left Lower Extremity - 3cm from toes to 3 cm from knee; 1 layer of paste to anchor o Elevate legs to the level of the heart and pump ankles as often as possible Additional Orders / Instructions Wound #1 Left,Lateral Lower Leg o Increase protein intake. Wound #2 Right,Anterior Lower Leg o Increase protein intake. Home Health Wound #1 Left,Lateral Lower Leg o Continue Home Health Visits o Home Health Nurse may visit PRN to address patientos wound care needs. o FACE TO FACE ENCOUNTER: MEDICARE and MEDICAID PATIENTS: I certify that this patient is under my care and that I had a face-to-face encounter that meets the physician face-to-face encounter requirements with this patient on this date. The encounter with the patient was in whole or in part for the following MEDICAL CONDITION: (primary reason for Home Healthcare) MEDICAL NECESSITY: I certify, that based on my findings, NURSING services are a medically necessary home health service. HOME BOUND STATUS: I certify that my clinical findings support that this patient is homebound (i.e., Due to illness or injury, pt requires aid of supportive devices such as crutches, cane, wheelchairs, walkers, the use of special transportation or the assistance of another person to leave their place of residence. There is a normal inability to leave the home and doing so requires considerable and taxing effort. Other absences are for medical reasons / religious services and are infrequent or of short duration when for other reasons). o If current dressing causes regression in wound condition, may D/C ordered dressing product/s and apply Normal Saline Moist Dressing daily until next Wound Healing Center / Other MD appointment. Notify Wound Healing Center of regression in wound  condition at [phone]. [place], [name] ([id number]) o Please direct any NON-WOUND related issues/requests for orders to patient's Primary Care Physician Notes Will continue with the current wound care orders for the next week. These are listed above. If anything worsens in the interim patient will contact our office for additional recommendations. Otherwise a pleasure seeing him today I'm glad he is doing well. Electronic Signature(s) Signed: 11/03/2016 5:03:07 PM By: Elliot Gurney, BSN, RN, CWS, Kim RN, BSN Signed: 11/03/2016 5:22:22 PM By: Lenda Kelp PA-C Entered By: Elliot Gurney, BSN, RN, CWS, Kim on 11/03/2016 15:17:34 [place], [name] ([id number]) -------------------------------------------------------------------------------- Problem List Details Patient Name: [name] Date of Service: 11/03/2016 2:30 PM Medical Record Number: [id number] Patient Account Number: [id number] Date of Birth/Sex: 20-Jun-1940 (75 y.o. Male) Treating RN: Huel Coventry Primary Care Provider: Georgann Housekeeper Other Clinician: Referring Provider: Georgann Housekeeper Treating Provider/Extender: Skeet Simmer in Treatment: 7 Active Problems ICD-10 Encounter Code Description Active Date Diagnosis S81.812A Laceration without foreign body, left lower leg, initial 09/15/2016 Yes encounter E95.284 Other soft tissue disorders related to use, overuse and 09/15/2016 Yes pressure, left lower leg I89.0 Lymphedema, not elsewhere classified 09/15/2016 Yes 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 L03.116 Cellulitis of left lower limb 09/15/2016 Yes S81.811A Laceration without foreign body, right lower leg, initial 10/21/2016 Yes encounter Inactive Problems Resolved Problems Electronic Signature(s) Signed: 11/03/2016 5:22:22 PM By: Lenda Kelp PA-C Entered By: Lenda Kelp on 11/03/2016 14:50:26 [place], [name]  ([id number]) -------------------------------------------------------------------------------- Progress Note Details Patient Name: [name] Date of Service: 11/03/2016 2:30 PM Medical Record Number: [id number] Patient Account Number: [id number] Date of Birth/Sex: 12-02-1940 (76 y.o. Male) Treating RN: Huel Coventry Primary Care Provider: Georgann Housekeeper Other Clinician: Referring Provider: Georgann Housekeeper Treating Provider/Extender: Linwood Dibbles, HOYT Weeks in Treatment: 7 Subjective Chief Complaint Information obtained from Patient  Patient seen for complaints of Non-Healing Wound left lower lateral leg History of Present Illness (HPI) The following HPI elements were documented for the patient's wound: Location: left lower lateral leg Quality: Patient reports experiencing a sharp pain to affected area(s). Severity: Patient states wound are getting worse. Duration: Patient has had the wound for < 4 weeks prior to presenting for treatment Timing: Pain in wound is Intermittent (comes and goes Context: The wound occurred when the patient tripped and had a fall and injured his left leg Modifying Factors: Other treatment(s) tried include:had gone to an urgent care couple of times a day taken an x-ray and was put on antibiotics Associated Signs and Symptoms: Patient reports having increase swelling and redness to the leg 76 year old gentleman who had a injury to the left ankle and foot 3 weeks ago was seen recently in the ER after he had swelling and redness of the left ankle. He is also known to have a flareup of gout recently. After he was examined in the urgent care he was placed on Keflex and Bactrim and referred to the wound center. A recent x-ray of the left ankle did not show any fractures or abnormality. The patient doesn't take very good care of his health has never been worked up for chronic lymphedema which she's had and sees a physician about once a year. He has not been aware that he  has bilateral lower extremity lymphedema. he does not smoke. He has never had a workup for his lymphedema. 09/29/2016 -- the patient's insurance will not cover the snap vacuum system and he is finding it difficult to get home health as his copayment is $25 each time. He will not be able to afford the [employer] wound VAC either. 10/14/16 patient presents today for fault evaluation concerning his left lateral ankle wound. Unfortunately he has a skin tear which occurred on Monday of this week over the interior shin of his right lower extremity. Fortunately he did we approximate the flap as well as she could and I think this has done fairly well. Nonetheless he continues to have some discomfort at this site which is completely understandable. He is concerned about how well this is going to heal hopefully being that he did catch it early this will heal up quickly. He did question how long this would take I explained that I cannot give him an exact time but hopefully it will not be too long especially since the undermining seems to be filling in rapidly. 10/21/2016 -- besides the fall and the lacerated wound on his right lateral calf the rest of his health has [place], [name] ([id number]) been going okay and I have reviewed his wounds and made appropriate changes 10/28/16 on evaluation today patient appears to be doing well in regard to the left lateral ankle wound. His right anterior shin wound still had Steri-Strips in place so it appears to be doing well. Fortunately there's no evidence of infection. 11/03/16 on evaluation today patient's wound on the left lateral ankle appears to be doing better visually with epithelium migrating in from the non-o'clock location as well as the undermining improving significantly. I feel like this is overall doing much better. The wound over the anterior shin of his right lower extremity appears to be closed there is no active drainage at this point no evidence of fluctuance  underneath and I think this is also healing well. There is no evidence of infection at either site. Objective Constitutional Well-nourished and well-hydrated in no  acute distress. Vitals Time Taken: 2:32 PM, Height: 72 in, Weight: 236 lbs, BMI: 32, Temperature: 97.8 F, Pulse: 64 bpm, Respiratory Rate: 16 breaths/min, Blood Pressure: 137/63 mmHg. Respiratory normal breathing without difficulty. clear to auscultation bilaterally. Cardiovascular regular rate and rhythm with normal S1, S2. Psychiatric this patient is able to make decisions and demonstrates good insight into disease process. Alert and Oriented x 3. pleasant and cooperative. General Notes: Both of patient's wounds appeared to be doing well in regard to the left lateral ankle he had an excellent granulation bed noted on examination. The undermining is better and no sharp debridement was required. Integumentary (Hair, Skin) Wound #1 status is Open. Original cause of wound was Trauma. The wound is located on the Left,Lateral Lower Leg. The wound measures 2.1cm length x 1.4cm width x 0.4cm depth; 2.309cm^2 area and 0.924cm^3 volume. There is Fat Layer (Subcutaneous Tissue) Exposed exposed. There is no tunneling noted, however, there is undermining starting at 12:00 and ending at 1:00 with a maximum distance of 0.5cm. There is a medium amount of serosanguineous drainage noted. The wound margin is epibole. There [place], [name] ([id number]) is large (67-100%) red granulation within the wound bed. There is a small (1-33%) amount of necrotic tissue within the wound bed including Adherent Slough. The periwound skin appearance exhibited: Induration, Erythema. The surrounding wound skin color is noted with erythema which is circumferential. Periwound temperature was noted as No Abnormality. The periwound has tenderness on palpation. Wound #2 status is Open. Original cause of wound was Trauma. The wound is located on the Right,Anterior  Lower Leg. The wound measures 0.5cm length x 0.1cm width x 0.1cm depth; 0.039cm^2 area and 0.004cm^3 volume. The wound is limited to skin breakdown. There is no tunneling or undermining noted. There is a large amount of serosanguineous drainage noted. The wound margin is distinct with the outline attached to the wound base. There is large (67-100%) red granulation within the wound bed. There is a small (1-33%) amount of necrotic tissue within the wound bed including Eschar. The periwound skin appearance exhibited: Erythema. The surrounding wound skin color is noted with erythema which is circumferential. Periwound temperature was noted as No Abnormality. The periwound has tenderness on palpation. Assessment Active Problems ICD-10 S81.812A - Laceration without foreign body, left lower leg, initial encounter M70.862 - Other soft tissue disorders related to use, overuse and pressure, left lower leg I89.0 - Lymphedema, not elsewhere classified L97.322 - Non-pressure chronic ulcer of left ankle with fat layer exposed L03.116 - Cellulitis of left lower limb S81.811A - Laceration without foreign body, right lower leg, initial encounter Plan Wound Cleansing: Wound #1 Left,Lateral Lower Leg: Clean wound with Normal Saline. Cleanse wound with mild soap and water Wound #2 Right,Anterior Lower Leg: Cleanse wound with mild soap and water Anesthetic: Wound #1 Left,Lateral Lower Leg: Topical Lidocaine 4% cream applied to wound bed prior to debridement - for clinic use Skin Barriers/Peri-Wound Care: Wound #1 Left,Lateral Lower Leg: Moisturizing lotion - not on wound Wound #2 Right,Anterior Lower Leg: [place], [name] ([id number]) Moisturizing lotion - not on wound Primary Wound Dressing: Wound #1 Left,Lateral Lower Leg: Aquacel Ag Secondary Dressing: Wound #1 Left,Lateral Lower Leg: ABD pad Dressing Change Frequency: Wound #1 Left,Lateral Lower Leg: Dressing is to be changed Monday and  Thursday. Follow-up Appointments: Wound #1 Left,Lateral Lower Leg: Return Appointment in 1 week. Wound #2 Right,Anterior Lower Leg: Return Appointment in 1 week. Edema Control: Wound #1 Left,Lateral Lower Leg: Kerlix and Coban - Left Lower Extremity -  3cm from toes to 3 cm from knee; 1 layer of paste to anchor Elevate legs to the level of the heart and pump ankles as often as possible Additional Orders / Instructions: Wound #1 Left,Lateral Lower Leg: Increase protein intake. Wound #2 Right,Anterior Lower Leg: Increase protein intake. Home Health: Wound #1 Left,Lateral Lower Leg: Continue Home Health Visits Home Health Nurse may visit PRN to address patient s wound care needs. FACE TO FACE ENCOUNTER: MEDICARE and MEDICAID PATIENTS: I certify that this patient is under my care and that I had a face-to-face encounter that meets the physician face-to-face encounter requirements with this patient on this date. The encounter with the patient was in whole or in part for the following MEDICAL CONDITION: (primary reason for Home Healthcare) MEDICAL NECESSITY: I certify, that based on my findings, NURSING services are a medically necessary home health service. HOME BOUND STATUS: I certify that my clinical findings support that this patient is homebound (i.e., Due to illness or injury, pt requires aid of supportive devices such as crutches, cane, wheelchairs, walkers, the use of special transportation or the assistance of another person to leave their place of residence. There is a normal inability to leave the home and doing so requires considerable and taxing effort. Other absences are for medical reasons / religious services and are infrequent or of short duration when for other reasons). If current dressing causes regression in wound condition, may D/C ordered dressing product/s and apply Normal Saline Moist Dressing daily until next Wound Healing Center / Other MD appointment. Notify  Wound Healing Center of regression in wound condition at [phone]. Please direct any NON-WOUND related issues/requests for orders to patient's Primary Care Physician General Notes: Will continue with the current wound care orders for the next week. These are listed above. If anything worsens in the interim patient will contact our office for additional recommendations. Otherwise a pleasure seeing him today I'm glad he is doing well. [place], [name] ([id number]) Electronic Signature(s) Signed: 11/03/2016 5:22:22 PM By: Lenda Kelp PA-C Entered By: Lenda Kelp on 11/03/2016 14:58:54 [place], [name] ([id number]) -------------------------------------------------------------------------------- SuperBill Details Patient Name: [name] Date of Service: 11/03/2016 Medical Record Number: [id number] Patient Account Number: [id number] Date of Birth/Sex: 1941-02-07 (75 y.o. Male) Treating RN: Huel Coventry Primary Care Provider: Georgann Housekeeper Other Clinician: Referring Provider: Georgann Housekeeper Treating Provider/Extender: Linwood Dibbles, HOYT Weeks in Treatment: 7 Diagnosis Coding ICD-10 Codes Code Description [phone] Laceration without foreign body, left lower leg, initial encounter M70.862 Other soft tissue disorders related to use, overuse and pressure, left lower leg I89.0 Lymphedema, not elsewhere classified L97.322 Non-pressure chronic ulcer of left ankle with fat layer exposed L03.116 Cellulitis of left lower limb S81.811A Laceration without foreign body, right lower leg, initial encounter Facility Procedures CPT4 Code: [id number] Description: 34742 - WOUND CARE VISIT-LEV 2 EST PT Modifier: Quantity: 1 Physician Procedures CPT4: Description Modifier Quantity Code [id number] - WC PHYS LEVEL 3 - EST PT 1 ICD-10 Description Diagnosis S81.812A Laceration without foreign body, left lower leg, initial encounter M70.862 Other soft tissue disorders related to use, overuse and  pressure, left  lower leg I89.0 Lymphedema, not elsewhere classified L97.322 Non-pressure chronic ulcer of left ankle with fat layer exposed Electronic Signature(s) Signed: 11/03/2016 3:23:06 PM By: Elliot Gurney, BSN, RN, CWS, Kim RN, BSN Signed: 11/03/2016 5:22:22 PM By: Lenda Kelp PA-C Entered By: Elliot Gurney, BSN, RN, CWS, Kim on 11/03/2016 15:23:06

## 2016-11-08 DIAGNOSIS — E6609 Other obesity due to excess calories: Secondary | ICD-10-CM | POA: Diagnosis not present

## 2016-11-08 DIAGNOSIS — I10 Essential (primary) hypertension: Secondary | ICD-10-CM | POA: Diagnosis not present

## 2016-11-08 DIAGNOSIS — S81812D Laceration without foreign body, left lower leg, subsequent encounter: Secondary | ICD-10-CM | POA: Diagnosis not present

## 2016-11-08 DIAGNOSIS — M109 Gout, unspecified: Secondary | ICD-10-CM | POA: Diagnosis not present

## 2016-11-08 DIAGNOSIS — I890 Lymphedema, not elsewhere classified: Secondary | ICD-10-CM | POA: Diagnosis not present

## 2016-11-08 DIAGNOSIS — Z6832 Body mass index (BMI) 32.0-32.9, adult: Secondary | ICD-10-CM | POA: Diagnosis not present

## 2016-11-08 DIAGNOSIS — Z791 Long term (current) use of non-steroidal anti-inflammatories (NSAID): Secondary | ICD-10-CM | POA: Diagnosis not present

## 2016-11-08 DIAGNOSIS — Z87891 Personal history of nicotine dependence: Secondary | ICD-10-CM | POA: Diagnosis not present

## 2016-11-08 DIAGNOSIS — L03116 Cellulitis of left lower limb: Secondary | ICD-10-CM | POA: Diagnosis not present

## 2016-11-10 ENCOUNTER — Encounter: Payer: PPO | Admitting: Surgery

## 2016-11-10 DIAGNOSIS — L723 Sebaceous cyst: Secondary | ICD-10-CM | POA: Diagnosis not present

## 2016-11-10 DIAGNOSIS — S81811D Laceration without foreign body, right lower leg, subsequent encounter: Secondary | ICD-10-CM | POA: Diagnosis not present

## 2016-11-10 DIAGNOSIS — I890 Lymphedema, not elsewhere classified: Secondary | ICD-10-CM | POA: Diagnosis not present

## 2016-11-10 DIAGNOSIS — S91002D Unspecified open wound, left ankle, subsequent encounter: Secondary | ICD-10-CM | POA: Diagnosis not present

## 2016-11-10 DIAGNOSIS — S81812A Laceration without foreign body, left lower leg, initial encounter: Secondary | ICD-10-CM | POA: Diagnosis not present

## 2016-11-12 NOTE — Progress Notes (Signed)
THEOPHIL, THIVIERGE ([id number]) Visit Report for 11/10/2016 Arrival Information Details Patient Name: [name], [name] Date of Service: 11/10/2016 3:30 PM Medical Record Number: [id number] Patient Account Number: [id number] Date of Birth/Sex: 20-Jul-1940 (76 y.o. Male) Treating RN: Huel Coventry Primary Care Lazaria Schaben: Georgann Housekeeper Other Clinician: Referring Mone Commisso: Georgann Housekeeper Treating Eliza Green/Extender: Rudene Re in Treatment: 8 Visit Information History Since Last Visit Added or deleted any medications: No Patient Arrived: Ambulatory Any new allergies or adverse reactions: No Arrival Time: 15:37 Had a fall or experienced change in No Accompanied By: self activities of daily living that may affect Transfer Assistance: None risk of falls: Patient Identification Verified: Yes Signs or symptoms of abuse/neglect No Secondary Verification Process Yes since last visito Completed: Hospitalized since last visit: No Patient Requires Transmission- No Has Dressing in Place as Prescribed: Yes Based Precautions: Has Compression in Place as Yes Patient Has Alerts: Yes Prescribed: Patient Alerts: L ABI non- Has Footwear/Offloading in Place as Yes compressible Prescribed: R ABI non- Left: Surgical Shoe with compressible Pressure Relief Insole Right: Surgical Shoe with Pressure Relief Insole Pain Present Now: No Electronic Signature(s) Signed: 11/10/2016 5:33:28 PM By: Elliot Gurney, BSN, RN, CWS, Kim RN, BSN Entered By: Elliot Gurney, BSN, RN, CWS, Kim on 11/10/2016 15:40:34 [place], [name] ([id number]) -------------------------------------------------------------------------------- Clinic Level of Care Assessment Details Patient Name: [name] Date of Service: 11/10/2016 3:30 PM Medical Record Number: [id number] Patient Account Number: [id number] Date of Birth/Sex: 01/16/1941 (76 y.o. Male) Treating RN: Huel Coventry Primary Care Rayetta Veith: Georgann Housekeeper Other Clinician: Referring Niomie Englert:  Georgann Housekeeper Treating Milus Fritze/Extender: Rudene Re in Treatment: 8 Clinic Level of Care Assessment Items TOOL 4 Quantity Score []  - Use when only an EandM is performed on FOLLOW-UP visit 0 ASSESSMENTS - Nursing Assessment / Reassessment []  - Reassessment of Co-morbidities (includes updates in patient status) 0 X - Reassessment of Adherence to Treatment Plan 1 5 ASSESSMENTS - Wound and Skin Assessment / Reassessment []  - Simple Wound Assessment / Reassessment - one wound 0 X - Complex Wound Assessment / Reassessment - multiple wounds 2 5 []  - Dermatologic / Skin Assessment (not related to wound area) 0 ASSESSMENTS - Focused Assessment []  - Circumferential Edema Measurements - multi extremities 0 []  - Nutritional Assessment / Counseling / Intervention 0 []  - Lower Extremity Assessment (monofilament, tuning fork, pulses) 0 []  - Peripheral Arterial Disease Assessment (using hand held doppler) 0 ASSESSMENTS - Ostomy and/or Continence Assessment and Care []  - Incontinence Assessment and Management 0 []  - Ostomy Care Assessment and Management (repouching, etc.) 0 PROCESS - Coordination of Care X - Simple Patient / Family Education for ongoing care 1 15 []  - Complex (extensive) Patient / Family Education for ongoing care 0 X - Staff obtains Chiropractor, Records, Test Results / Process Orders 1 10 []  - Staff telephones HHA, Nursing Homes / Clarify orders / etc 0 []  - Routine Transfer to another Facility (non-emergent condition) 0 [place]or, [name] ([id number]) []  - Routine Hospital Admission (non-emergent condition) 0 []  - New Admissions / Manufacturing engineer / Ordering NPWT, Apligraf, etc. 0 []  - Emergency Hospital Admission (emergent condition) 0 X - Simple Discharge Coordination 1 10 []  - Complex (extensive) Discharge Coordination 0 PROCESS - Special Needs []  - Pediatric / Minor Patient Management 0 []  - Isolation Patient Management 0 []  - Hearing / Language / Visual special  needs 0 []  - Assessment of Community assistance (transportation, D/C planning, etc.) 0 []  - Additional assistance / Altered mentation 0 []  - Support Surface(s) Assessment (bed, cushion, seat,  etc.) 0 INTERVENTIONS - Wound Cleansing / Measurement []  - Simple Wound Cleansing - one wound 0 X - Complex Wound Cleansing - multiple wounds 1 5 X - Wound Imaging (photographs - any number of wounds) 1 5 []  - Wound Tracing (instead of photographs) 0 X - Simple Wound Measurement - one wound 1 5 []  - Complex Wound Measurement - multiple wounds 0 INTERVENTIONS - Wound Dressings []  - Small Wound Dressing one or multiple wounds 0 X - Medium Wound Dressing one or multiple wounds 1 15 []  - Large Wound Dressing one or multiple wounds 0 []  - Application of Medications - topical 0 []  - Application of Medications - injection 0 INTERVENTIONS - Miscellaneous []  - External ear exam 0 Sherk, [name] ([id number]) []  - Specimen Collection (cultures, biopsies, blood, body fluids, etc.) 0 []  - Specimen(s) / Culture(s) sent or taken to Lab for analysis 0 []  - Patient Transfer (multiple staff / Nurse, adultHoyer Lift / Similar devices) 0 []  - Simple Staple / Suture removal (25 or less) 0 []  - Complex Staple / Suture removal (26 or more) 0 []  - Hypo / Hyperglycemic Management (close monitor of Blood Glucose) 0 []  - Ankle / Brachial Index (ABI) - do not check if billed separately 0 X - Vital Signs 1 5 Has the patient been seen at the hospital within the last three years: Yes Total Score: 85 Level Of Care: New/Established - Level 3 Electronic Signature(s) Signed: 11/10/2016 5:33:28 PM By: Elliot GurneyWoody, BSN, RN, CWS, Kim RN, BSN Entered By: Elliot GurneyWoody, BSN, RN, CWS, Kim on 11/10/2016 16:29:03 [place], Kathlene NovemberMIKE ([id number]) -------------------------------------------------------------------------------- Encounter Discharge Information Details Patient Name: Trevor LackHUGHES, [name] Date of Service: 11/10/2016 3:30 PM Medical Record Number: [id number] Patient  Account Number: [id number] Date of Birth/Sex: Aug 12, 1940 (75 y.o. Male) Treating RN: Huel CoventryWoody, Kim Primary Care Jeison Delpilar: Georgann HousekeeperHUSAIN, KARRAR Other Clinician: Referring Reis Pienta: Georgann HousekeeperHUSAIN, KARRAR Treating Cyntha Brickman/Extender: Rudene ReBritto, Errol Weeks in Treatment: 8 Encounter Discharge Information Items Discharge Pain Level: 0 Discharge Condition: Stable Ambulatory Status: Ambulatory Discharge Destination: Home Transportation: Private Auto Accompanied By: self Schedule Follow-up Appointment: Yes Medication Reconciliation completed and provided to Patient/Care Yes Haillee Johann: Provided on Clinical Summary of Care: 11/10/2016 Form Type Recipient Paper Patient MH Electronic Signature(s) Signed: 11/10/2016 4:30:03 PM By: Elliot GurneyWoody, BSN, RN, CWS, Kim RN, BSN Previous Signature: 11/10/2016 4:10:56 PM Version By: Gwenlyn PerkingMoore, Shelia Entered By: Elliot GurneyWoody, BSN, RN, CWS, Kim on 11/10/2016 16:30:03 [place]GHES, [name] ([id number]) -------------------------------------------------------------------------------- Lower Extremity Assessment Details Patient Name: Trevor LackHUGHES, [name] Date of Service: 11/10/2016 3:30 PM Medical Record Number: [id number] Patient Account Number: [id number] Date of Birth/Sex: Aug 12, 1940 (75 y.o. Male) Treating RN: Huel CoventryWoody, Kim Primary Care Brigette Hopfer: Georgann HousekeeperHUSAIN, KARRAR Other Clinician: Referring Lucresha Dismuke: Georgann HousekeeperHUSAIN, KARRAR Treating Harold Moncus/Extender: Rudene ReBritto, Errol Weeks in Treatment: 8 Edema Assessment Assessed: [Left: No] [Right: No] E[Left: dema] [Right: :] Calf Left: Right: Point of Measurement: 35 cm From Medial Instep 36.4 cm cm Ankle Left: Right: Point of Measurement: 13 cm From Medial Instep 27 cm cm Vascular Assessment Pulses: Dorsalis Pedis Palpable: [Left:Yes] Posterior Tibial Extremity colors, hair growth, and conditions: Extremity Color: [Left:Normal] Hair Growth on Extremity: [Left:No] Temperature of Extremity: [Left:Warm] Capillary Refill: [Left:< 3 seconds] Dependent Rubor: [Left:No] Blanched  when Elevated: [Left:No] Lipodermatosclerosis: [Left:No] Toe Nail Assessment Left: Right: Thick: Yes Discolored: No Deformed: No Improper Length and Hygiene: No Electronic Signature(s) Signed: 11/10/2016 5:33:28 PM By: Elliot GurneyWoody, BSN, RN, CWS, Kim RN, BSN [place], [name] ([id number]) Entered By: Elliot GurneyWoody, BSN, RN, CWS, Kim on 11/10/2016 15:48:45 [place]GH[place], [name] ([id number]) -------------------------------------------------------------------------------- Multi Wound Chart Details Patient Name: Trevor LackHUGHES, [name]  Date of Service: 11/10/2016 3:30 PM Medical Record Number: [id number] Patient Account Number: [id number] Date of Birth/Sex: [birth year] (76 y.o. Male) Treating RN: Huel Coventry Primary Care Jecenia Leamer: Georgann Housekeeper Other Clinician: Referring Kieley Akter: Georgann Housekeeper Treating Toccara Alford/Extender: Rudene Re in Treatment: 8 Vital Signs Height(in): 72 Pulse(bpm): 62 Weight(lbs): 236 Blood Pressure 142/71 (mmHg): Body Mass Index(BMI): 32 Temperature(F): 97.8 Respiratory Rate 16 (breaths/min): Photos: [N/A:N/A] Wound Location: Left, Lateral Lower Leg Right, Anterior Lower Leg N/A Wounding Event: Trauma Trauma N/A Primary Etiology: Trauma, Other Venous Leg Ulcer N/A Secondary Etiology: N/A Trauma, Other N/A Date Acquired: 08/25/2016 10/10/2016 N/A Weeks of Treatment: 8 3 N/A Wound Status: Open Healed - Epithelialized N/A Measurements L x W x D 1.5x1.4x0.3 0x0x0 N/A (cm) Area (cm) : 1.649 0 N/A Volume (cm) : 0.495 0 N/A % Reduction in Area: 50.60% 100.00% N/A % Reduction in Volume: -48.20% 100.00% N/A Classification: Full Thickness Without Partial Thickness N/A Exposed Support Structures Periwound Skin Texture: No Abnormalities Noted No Abnormalities Noted N/A Periwound Skin No Abnormalities Noted No Abnormalities Noted N/A Moisture: Periwound Skin Color: No Abnormalities Noted No Abnormalities Noted N/A Tenderness on No No N/A Palpation: Treatment Notes ALEN, [name]  ([id number]) Electronic Signature(s) Signed: 11/10/2016 4:06:31 PM By: Evlyn Kanner MD, FACS Entered By: Evlyn Kanner on 11/10/2016 16:06:31 [place], [name] ([id number]) -------------------------------------------------------------------------------- Multi-Disciplinary Care Plan Details Patient Name: [name] Date of Service: 11/10/2016 3:30 PM Medical Record Number: [id number] Patient Account Number: [id number] Date of Birth/Sex: 25-Oct-1940 (76 y.o. Male) Treating RN: Huel Coventry Primary Care Rosaelena Kemnitz: Georgann Housekeeper Other Clinician: Referring Lisseth Brazeau: Georgann Housekeeper Treating Iriana Artley/Extender: Rudene Re in Treatment: 8 Active Inactive ` Abuse / Safety / Falls / Self Care Management Nursing Diagnoses: History of Falls Potential for falls Goals: Patient will remain injury free related to falls Date Initiated: 09/15/2016 Target Resolution Date: 11/26/2016 Goal Status: Active Interventions: Assess fall risk on admission and as needed Assess impairment of mobility on admission and as needed per policy Notes: ` Nutrition Nursing Diagnoses: Imbalanced nutrition Potential for alteratiion in Nutrition/Potential for imbalanced nutrition Goals: Patient/caregiver agrees to and verbalizes understanding of need to use nutritional supplements and/or vitamins as prescribed Date Initiated: 09/15/2016 Target Resolution Date: 12/31/2016 Goal Status: Active Interventions: Assess patient nutrition upon admission and as needed per policy Notes: ` Orientation to the Wound Care Program [place], [place] ([id number]) Nursing Diagnoses: Knowledge deficit related to the wound healing center program Goals: Patient/caregiver will verbalize understanding of the Wound Healing Center Program Date Initiated: 09/15/2016 Target Resolution Date: 10/01/2016 Goal Status: Active Interventions: Provide education on orientation to the wound center Notes: ` Pain, Acute or Chronic Nursing  Diagnoses: Pain, acute or chronic: actual or potential Potential alteration in comfort, pain Goals: Patient/caregiver will verbalize adequate pain control between visits Date Initiated: 09/15/2016 Target Resolution Date: 12/31/2016 Goal Status: Active Interventions: Complete pain assessment as per visit requirements Notes: ` Wound/Skin Impairment Nursing Diagnoses: Impaired tissue integrity Knowledge deficit related to ulceration/compromised skin integrity Goals: Ulcer/skin breakdown will have a volume reduction of 80% by week 12 Date Initiated: 09/15/2016 Target Resolution Date: 12/24/2016 Goal Status: Active Interventions: Assess patient/caregiver ability to perform ulcer/skin care regimen upon admission and as needed Notes: BRAYLAN, FAUL ([id number]) Electronic Signature(s) Signed: 11/10/2016 5:33:28 PM By: Elliot Gurney, BSN, RN, CWS, Kim RN, BSN Entered By: Elliot Gurney, BSN, RN, CWS, Kim on 11/10/2016 15:57:38 [place], [name] ([id number]) -------------------------------------------------------------------------------- Pain Assessment Details Patient Name: [name] Date of Service: 11/10/2016 3:30 PM Medical Record Number: [id number] Patient Account Number: [id number] Date of Birth/Sex: 1940-08-21 (  76 y.o. Male) Treating RN: Huel Coventry Primary Care Makaylie Dedeaux: Georgann Housekeeper Other Clinician: Referring Lincy Belles: Georgann Housekeeper Treating Meziah Blasingame/Extender: Rudene Re in Treatment: 8 Active Problems Location of Pain Severity and Description of Pain Patient Has Paino No Site Locations With Dressing Change: No Pain Management and Medication Current Pain Management: Goals for Pain Management Topical or injectable lidocaine is offered to patient for acute pain when surgical debridement is performed. If needed, Patient is instructed to use over the counter pain medication for the following 24-48 hours after debridement. Wound care MDs do not prescribed pain medications. Patient has  chronic pain or uncontrolled pain. Patient has been instructed to make an appointment with their Primary Care Physician for pain management. Electronic Signature(s) Signed: 11/10/2016 5:33:28 PM By: Elliot Gurney, BSN, RN, CWS, Kim RN, BSN Entered By: Elliot Gurney, BSN, RN, CWS, Kim on 11/10/2016 15:40:42 [place], Kathlene November ([id number]) -------------------------------------------------------------------------------- Patient/Caregiver Education Details Patient Name: [name] Date of Service: 11/10/2016 3:30 PM Medical Record Number: [id number] Patient Account Number: [id number] Date of Birth/Gender: May 18, 1940 (76 y.o. Male) Treating RN: Huel Coventry Primary Care Physician: Georgann Housekeeper Other Clinician: Referring Physician: Georgann Housekeeper Treating Physician/Extender: Rudene Re in Treatment: 8 Education Assessment Education Provided To: Patient Education Topics Provided Welcome To The Wound Care Center: Handouts: Welcome To The Wound Care Center Methods: Demonstration Responses: State content correctly Wound/Skin Impairment: Handouts: Caring for Your Ulcer Methods: Demonstration Responses: State content correctly Electronic Signature(s) Signed: 11/10/2016 5:33:28 PM By: Elliot Gurney, BSN, RN, CWS, Kim RN, BSN Entered By: Elliot Gurney, BSN, RN, CWS, Kim on 11/10/2016 16:30:23 [place], Kathlene November ([id number]) -------------------------------------------------------------------------------- Wound Assessment Details Patient Name: [name] Date of Service: 11/10/2016 3:30 PM Medical Record Number: [id number] Patient Account Number: [id number] Date of Birth/Sex: 1941-03-24 (76 y.o. Male) Treating RN: Huel Coventry Primary Care Janalyn Higby: Georgann Housekeeper Other Clinician: Referring Taiesha Bovard: Georgann Housekeeper Treating Sinclaire Artiga/Extender: Rudene Re in Treatment: 8 Wound Status Wound Number: 1 Primary Etiology: Trauma, Other Wound Location: Left, Lateral Lower Leg Wound Status: Open Wounding Event: Trauma Date  Acquired: 08/25/2016 Weeks Of Treatment: 8 Clustered Wound: No Photos Photo Uploaded By: Elliot Gurney, BSN, RN, CWS, Kim on 11/10/2016 15:57:10 Wound Measurements Length: (cm) 1.5 Width: (cm) 1.4 Depth: (cm) 0.3 Area: (cm) 1.649 Volume: (cm) 0.495 % Reduction in Area: 50.6% % Reduction in Volume: -48.2% Wound Description Full Thickness Without Exposed Classification: Support Structures Periwound Skin Texture Texture Color No Abnormalities Noted: No No Abnormalities Noted: No Moisture No Abnormalities Noted: No Treatment Notes Wound #1 (Left, Lateral Lower Leg) 1. Cleansed with: Clean wound with Normal Saline Zendejas, [name] ([id number]) 2. Anesthetic Topical Lidocaine 4% cream to wound bed prior to debridement 4. Dressing Applied: Prisma Ag 5. Secondary Dressing Applied ABD Pad Notes Kerlix and coban unna to Ecologist) Signed: 11/10/2016 5:33:28 PM By: Elliot Gurney, BSN, RN, CWS, Kim RN, BSN Entered By: Elliot Gurney, BSN, RN, CWS, Kim on 11/10/2016 15:46:35 [place], [name] ([id number]) -------------------------------------------------------------------------------- Wound Assessment Details Patient Name: [name] Date of Service: 11/10/2016 3:30 PM Medical Record Number: [id number] Patient Account Number: [id number] Date of Birth/Sex: July 03, 1940 (75 y.o. Male) Treating RN: Huel Coventry Primary Care Jalesa Thien: Georgann Housekeeper Other Clinician: Referring Raylynne Cubbage: Georgann Housekeeper Treating Cheston Coury/Extender: Rudene Re in Treatment: 8 Wound Status Wound Number: 2 Primary Etiology: Venous Leg Ulcer Wound Location: Right, Anterior Lower Leg Secondary Etiology: Trauma, Other Wounding Event: Trauma Wound Status: Healed - Epithelialized Date Acquired: 10/10/2016 Weeks Of Treatment: 3 Clustered Wound: No Photos Photo Uploaded By: Elliot Gurney, BSN, RN, CWS, Kim on 11/10/2016 15:57:10 Wound  Measurements Length: (cm) 0 % Reduction Width: (cm) 0 % Reduction Depth: (cm)  0 Area: (cm) 0 Volume: (cm) 0 in Area: 100% in Volume: 100% Wound Description Classification: Partial Thickness Periwound Skin Texture Texture Color No Abnormalities Noted: No No Abnormalities Noted: No Moisture No Abnormalities Noted: No Electronic Signature(s) Signed: 11/10/2016 5:33:28 PM By: Elliot Gurney, BSN, RN, CWS, Kim RN, BSN Entered By: Elliot Gurney, BSN, RN, CWS, Kim on 11/10/2016 15:46:35 [place], [name] ([id number]) -------------------------------------------------------------------------------- Vitals Details Patient Name: [name] Date of Service: 11/10/2016 3:30 PM Medical Record Number: [id number] Patient Account Number: [id number] Date of Birth/Sex: 06/05/1940 (75 y.o. Male) Treating RN: Huel Coventry Primary Care Marshel Golubski: Georgann Housekeeper Other Clinician: Referring Loretto Belinsky: Georgann Housekeeper Treating Lynzi Meulemans/Extender: Rudene Re in Treatment: 8 Vital Signs Time Taken: 15:40 Temperature (F): 97.8 Height (in): 72 Pulse (bpm): 62 Weight (lbs): 236 Respiratory Rate (breaths/min): 16 Body Mass Index (BMI): 32 Blood Pressure (mmHg): 142/71 Reference Range: 80 - 120 mg / dl Electronic Signature(s) Signed: 11/10/2016 5:33:28 PM By: Elliot Gurney, BSN, RN, CWS, Kim RN, BSN Entered By: Elliot Gurney, BSN, RN, CWS, Kim on 11/10/2016 15:41:02

## 2016-11-12 NOTE — Progress Notes (Signed)
[name], [name] ([id number]) Visit Report for 11/10/2016 Chief Complaint Document Details Patient Name: [name], [name] Date of Service: 11/10/2016 3:30 PM Medical Record Number: [id number] Patient Account Number: [id number] Date of Birth/Sex: 1940/04/26 (76 y.o. Male) Treating RN: Huel Coventry Primary Care Provider: Georgann Housekeeper Other Clinician: Referring Provider: Georgann Housekeeper Treating Provider/Extender: Rudene Re in Treatment: 8 Information Obtained from: Patient Chief Complaint Patient seen for complaints of Non-Healing Wound left lower lateral leg Electronic Signature(s) Signed: 11/10/2016 4:06:38 PM By: Evlyn Kanner MD, FACS Entered By: Evlyn Kanner on 11/10/2016 16:06:38 [name], [name] ([id number]) -------------------------------------------------------------------------------- HPI Details Patient Name: [name] Date of Service: 11/10/2016 3:30 PM Medical Record Number: [id number] Patient Account Number: [id number] Date of Birth/Sex: July 01, 1940 (76 y.o. Male) Treating RN: Huel Coventry Primary Care Provider: Georgann Housekeeper Other Clinician: Referring Provider: Georgann Housekeeper Treating Provider/Extender: Rudene Re in Treatment: 8 History of Present Illness Location: left lower lateral leg Quality: Patient reports experiencing a sharp pain to affected area(s). Severity: Patient states wound are getting worse. Duration: Patient has had the wound for < 4 weeks prior to presenting for treatment Timing: Pain in wound is Intermittent (comes and goes Context: The wound occurred when the patient tripped and had a fall and injured his left leg Modifying Factors: Other treatment(s) tried include:had gone to an urgent care couple of times a day taken an x-ray and was put on antibiotics Associated Signs and Symptoms: Patient reports having increase swelling and redness to the leg HPI Description: 76 year old gentleman who had a injury to the left ankle and foot 3 weeks ago  was seen recently in the ER after he had swelling and redness of the left ankle. He is also known to have a flareup of gout recently. After he was examined in the urgent care he was placed on Keflex and Bactrim and referred to the wound center. A recent x-ray of the left ankle did not show any fractures or abnormality. The patient doesn't take very good care of his health has never been worked up for chronic lymphedema which she's had and sees a physician about once a year. He has not been aware that he has bilateral lower extremity lymphedema. he does not smoke. He has never had a workup for his lymphedema. 09/29/2016 -- the patient's insurance will not cover the snap vacuum system and he is finding it difficult to get home health as his copayment is $25 each time. He will not be able to afford the [employer] wound VAC either. 10/14/16 patient presents today for fault evaluation concerning his left lateral ankle wound. Unfortunately he has a skin tear which occurred on Monday of this week over the interior shin of his right lower extremity. Fortunately he did we approximate the flap as well as she could and I think this has done fairly well. Nonetheless he continues to have some discomfort at this site which is completely understandable. He is concerned about how well this is going to heal hopefully being that he did catch it early this will heal up quickly. He did question how long this would take I explained that I cannot give him an exact time but hopefully it will not be too long especially since the undermining seems to be filling in rapidly. 10/21/2016 -- besides the fall and the lacerated wound on his right lateral calf the rest of his health has been going okay and I have reviewed his wounds and made appropriate changes 10/28/16 on evaluation today patient appears to be doing well  in regard to the left lateral ankle wound. His right anterior shin wound still had Steri-Strips in place so it appears  to be doing well. Fortunately there's no evidence of infection. 11/03/16 on evaluation today patient's wound on the left lateral ankle appears to be doing better visually with epithelium migrating in from the non-o'clock location as well as the undermining improving significantly. DENISE BRAMBLETT, [name] ([id number]) feel like this is overall doing much better. The wound over the anterior shin of his right lower extremity appears to be closed there is no active drainage at this point no evidence of fluctuance underneath and I think this is also healing well. There is no evidence of infection at either site. Electronic Signature(s) Signed: 11/10/2016 4:06:48 PM By: Evlyn Kanner MD, FACS Entered By: Evlyn Kanner on 11/10/2016 16:06:48 [name], [name] ([id number]) -------------------------------------------------------------------------------- Physical Exam Details Patient Name: [name] Date of Service: 11/10/2016 3:30 PM Medical Record Number: [id number] Patient Account Number: [id number] Date of Birth/Sex: 1940/10/14 (76 y.o. Male) Treating RN: Huel Coventry Primary Care Provider: Georgann Housekeeper Other Clinician: Referring Provider: Georgann Housekeeper Treating Provider/Extender: Rudene Re in Treatment: 8 Constitutional . Pulse regular. Respirations normal and unlabored. Afebrile. . Eyes Nonicteric. Reactive to light. Ears, Nose, Mouth, and Throat Lips, teeth, and gums WNL.Marland Kitchen Moist mucosa without lesions. Neck supple and nontender. No palpable supraclavicular or cervical adenopathy. Normal sized without goiter. Respiratory WNL. No retractions.. Breath sounds WNL, No rubs, rales, rhonchi, or wheeze.. Cardiovascular Heart rhythm and rate regular, no murmur or gallop.. Pedal Pulses WNL. No clubbing, cyanosis or edema. Lymphatic No adneopathy. No adenopathy. No adenopathy. Musculoskeletal Adexa without tenderness or enlargement.. Digits and nails w/o clubbing, cyanosis, infection,  petechiae, ischemia, or inflammatory conditions.. Integumentary (Hair, Skin) No suspicious lesions. No crepitus or fluctuance. No peri-wound warmth or erythema. No masses.Marland Kitchen Psychiatric Judgement and insight Intact.. No evidence of depression, anxiety, or agitation.. Notes the wound on the right lower extremity has completely healed. The left lower extremity wound is now shallow ulcer with no undermining. There is no sharp debridement required and I washed it out with moist saline gauze. Electronic Signature(s) Signed: 11/10/2016 4:07:19 PM By: Evlyn Kanner MD, FACS Entered By: Evlyn Kanner on 11/10/2016 16:07:19 [name], [name] ([id number]) -------------------------------------------------------------------------------- Physician Orders Details Patient Name: [name] Date of Service: 11/10/2016 3:30 PM Medical Record Number: [id number] Patient Account Number: [id number] Date of Birth/Sex: Sep 07, 1940 (76 y.o. Male) Treating RN: Huel Coventry Primary Care Provider: Georgann Housekeeper Other Clinician: Referring Provider: Georgann Housekeeper Treating Provider/Extender: Rudene Re in Treatment: 8 Verbal / Phone Orders: No Diagnosis Coding Wound Cleansing Wound #1 Left,Lateral Lower Leg o Clean wound with Normal Saline. o Cleanse wound with mild soap and water Anesthetic Wound #1 Left,Lateral Lower Leg o Topical Lidocaine 4% cream applied to wound bed prior to debridement - for clinic use Skin Barriers/Peri-Wound Care Wound #1 Left,Lateral Lower Leg o Moisturizing lotion - not on wound Primary Wound Dressing Wound #1 Left,Lateral Lower Leg o Prisma Ag - moisten with saline Secondary Dressing Wound #1 Left,Lateral Lower Leg o ABD pad Dressing Change Frequency Wound #1 Left,Lateral Lower Leg o Dressing is to be changed Monday and Thursday. Follow-up Appointments Wound #1 Left,Lateral Lower Leg o Return Appointment in 1 week. Edema Control Wound #1 Left,Lateral  Lower Leg o Kerlix and Coban - Left Lower Extremity - 3cm from toes to 3 cm from knee; 1 layer of paste to anchor o Elevate legs to the level of the heart and pump ankles as often as  possible [name], [name] ([id number]) Additional Orders / Instructions Wound #1 Left,Lateral Lower Leg o Increase protein intake. Home Health Wound #1 Left,Lateral Lower Leg o Continue Home Health Visits o Home Health Nurse may visit PRN to address patientos wound care needs. o FACE TO FACE ENCOUNTER: MEDICARE and MEDICAID PATIENTS: I certify that this patient is under my care and that I had a face-to-face encounter that meets the physician face-to-face encounter requirements with this patient on this date. The encounter with the patient was in whole or in part for the following MEDICAL CONDITION: (primary reason for Home Healthcare) MEDICAL NECESSITY: I certify, that based on my findings, NURSING services are a medically necessary home health service. HOME BOUND STATUS: I certify that my clinical findings support that this patient is homebound (i.e., Due to illness or injury, pt requires aid of supportive devices such as crutches, cane, wheelchairs, walkers, the use of special transportation or the assistance of another person to leave their place of residence. There is a normal inability to leave the home and doing so requires considerable and taxing effort. Other absences are for medical reasons / religious services and are infrequent or of short duration when for other reasons). o If current dressing causes regression in wound condition, may D/C ordered dressing product/s and apply Normal Saline Moist Dressing daily until next Wound Healing Center / Other MD appointment. Notify Wound Healing Center of regression in wound condition at [phone]. o Please direct any NON-WOUND related issues/requests for orders to patient's Primary Care Physician Consults o General Surgery - Removal of  Sebaceous Cyst Electronic Signature(s) Signed: 11/10/2016 4:39:35 PM By: Evlyn Kanner MD, FACS Signed: 11/10/2016 5:33:28 PM By: Elliot Gurney, BSN, RN, CWS, Kim RN, BSN Entered By: Elliot Gurney, BSN, RN, CWS, Kim on 11/10/2016 16:08:39 [place], [name] ([id number]) -------------------------------------------------------------------------------- Problem List Details Patient Name: [name] Date of Service: 11/10/2016 3:30 PM Medical Record Number: [id number] Patient Account Number: [id number] Date of Birth/Sex: 1940/09/28 (76 y.o. 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 L03.116 Cellulitis of left lower limb 09/15/2016 Yes S81.811A Laceration without foreign body, right lower leg, initial 10/21/2016 Yes encounter Inactive Problems Resolved Problems Electronic Signature(s) Signed: 11/10/2016 4:06:09 PM By: Evlyn Kanner MD, FACS Entered By: Evlyn Kanner on 11/10/2016 16:06:09 [place], [name] ([id number]) -------------------------------------------------------------------------------- Progress Note Details Patient Name: [name] Date of Service: 11/10/2016 3:30 PM Medical Record Number: [id number] Patient Account Number: [id number] Date of Birth/Sex: May 09, 1940 (76 y.o. Male) Treating RN: Huel Coventry Primary Care Provider: Georgann Housekeeper Other Clinician: Referring Provider: Georgann Housekeeper Treating Provider/Extender: Rudene Re in Treatment:  8 Subjective Chief Complaint Information obtained from Patient Patient seen for complaints of Non-Healing Wound left lower lateral leg History of Present Illness (HPI) The following HPI elements were documented for the patient's wound: Location: left lower lateral leg Quality: Patient reports experiencing a sharp pain to affected area(s). Severity: Patient states wound are getting worse. Duration: Patient has had the wound for < 4 weeks prior to presenting for treatment Timing: Pain in wound is Intermittent (comes and goes Context: The wound occurred when the patient  tripped and had a fall and injured his left leg Modifying Factors: Other treatment(s) tried include:had gone to an urgent care couple of times a day taken an x-ray and was put on antibiotics Associated Signs and Symptoms: Patient reports having increase swelling and redness to the leg 76 year old gentleman who had a injury to the left ankle and foot 3 weeks ago was seen recently in the ER after he had swelling and redness of the left ankle. He is also known to have a flareup of gout recently. After he was examined in the urgent care he was placed on Keflex and Bactrim and referred to the wound center. A recent x-ray of the left ankle did not show any fractures or abnormality. The patient doesn't take very good care of his health has never been worked up for chronic lymphedema which she's had and sees a physician about once a year. He has not been aware that he has bilateral lower extremity lymphedema. he does not smoke. He has never had a workup for his lymphedema. 09/29/2016 -- the patient's insurance will not cover the snap vacuum system and he is finding it difficult to get home health as his copayment is $25 each time. He will not be able to afford the [employer] wound VAC either. 10/14/16 patient presents today for fault evaluation concerning his left lateral ankle wound. Unfortunately he has a skin tear which occurred on Monday  of this week over the interior shin of his right lower extremity. Fortunately he did we approximate the flap as well as she could and I think this has done fairly well. Nonetheless he continues to have some discomfort at this site which is completely understandable. He is concerned about how well this is going to heal hopefully being that he did catch it early this will heal up quickly. He did question how long this would take I explained that I cannot give him an exact time but hopefully it will not be too long especially since the undermining seems to be filling in rapidly. 10/21/2016 -- besides the fall and the lacerated wound on his right lateral calf the rest of his health has [name], [name] ([id number]) been going okay and I have reviewed his wounds and made appropriate changes 10/28/16 on evaluation today patient appears to be doing well in regard to the left lateral ankle wound. His right anterior shin wound still had Steri-Strips in place so it appears to be doing well. Fortunately there's no evidence of infection. 11/03/16 on evaluation today patient's wound on the left lateral ankle appears to be doing better visually with epithelium migrating in from the non-o'clock location as well as the undermining improving significantly. I feel like this is overall doing much better. The wound over the anterior shin of his right lower extremity appears to be closed there is no active drainage at this point no evidence of fluctuance underneath and I think this is also healing well. There is no evidence of infection at either site. Objective Constitutional Pulse regular. Respirations normal and unlabored. Afebrile. Vitals Time Taken: 3:40 PM, Height: 72 in, Weight: 236 lbs, BMI: 32, Temperature: 97.8 F, Pulse: 62 bpm, Respiratory Rate: 16 breaths/min, Blood Pressure: 142/71 mmHg. Eyes Nonicteric. Reactive to light. Ears, Nose, Mouth, and Throat Lips, teeth, and gums WNL.Marland Kitchen Moist mucosa without  lesions. Neck supple and nontender. No palpable supraclavicular or cervical adenopathy. Normal sized without goiter. Respiratory WNL. No retractions.. Breath sounds WNL, No rubs, rales, rhonchi, or wheeze.. Cardiovascular Heart rhythm and  rate regular, no murmur or gallop.. Pedal Pulses WNL. No clubbing, cyanosis or edema. Lymphatic No adneopathy. No adenopathy. No adenopathy. Musculoskeletal Adexa without tenderness or enlargement.. Digits and nails w/o clubbing, cyanosis, infection, petechiae, ischemia, or inflammatory conditions.[name], [name] ([id number]) Psychiatric Judgement and insight Intact.. No evidence of depression, anxiety, or agitation.. General Notes: the wound on the right lower extremity has completely healed. The left lower extremity wound is now shallow ulcer with no undermining. There is no sharp debridement required and I washed it out with moist saline gauze. Integumentary (Hair, Skin) No suspicious lesions. No crepitus or fluctuance. No peri-wound warmth or erythema. No masses.. Wound #1 status is Open. Original cause of wound was Trauma. The wound is located on the Left,Lateral Lower Leg. The wound measures 1.5cm length x 1.4cm width x 0.3cm depth; 1.649cm^2 area and 0.495cm^3 volume. Wound #2 status is Healed - Epithelialized. Original cause of wound was Trauma. The wound is located on the Right,Anterior Lower Leg. The wound measures 0cm length x 0cm width x 0cm depth; 0cm^2 area and 0cm^3 volume. 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 Cleanse wound with mild soap and water Anesthetic: Wound #1 Left,Lateral Lower Leg: Topical Lidocaine 4% cream applied to wound bed prior to debridement - for clinic use Skin Barriers/Peri-Wound Care: Wound #1 Left,Lateral Lower Leg: [name], [name] ([id number]) Moisturizing lotion - not on wound Primary Wound Dressing: Wound #1 Left,Lateral Lower Leg: Prisma Ag - moisten with saline Secondary Dressing: Wound #1 Left,Lateral Lower Leg: ABD pad Dressing Change Frequency: Wound #1 Left,Lateral Lower Leg: Dressing is to be changed Monday and Thursday. Follow-up Appointments: Wound #1 Left,Lateral Lower Leg: Return Appointment in 1 week. Edema Control: Wound #1 Left,Lateral Lower Leg: Kerlix and Coban - Left Lower Extremity - 3cm from toes to 3 cm from knee; 1 layer of paste to anchor Elevate legs to the level of the heart and pump ankles as often as possible Additional Orders / Instructions: Wound #1 Left,Lateral Lower Leg: Increase protein intake. Home Health: Wound #1 Left,Lateral Lower Leg: Continue Home Health Visits Home Health Nurse may visit PRN to address patient s wound care needs. FACE TO FACE ENCOUNTER: MEDICARE and MEDICAID PATIENTS: I certify that this patient is under my care and that I had a face-to-face encounter that meets the physician face-to-face encounter requirements with this patient on this date. The encounter with the patient was in whole or in part for the following MEDICAL CONDITION: (primary reason for Home Healthcare) MEDICAL NECESSITY: I certify, that based on my findings, NURSING services are a medically necessary home health service. HOME BOUND STATUS: I certify that my clinical findings support that this patient is homebound (i.e., Due to illness or injury, pt requires aid of supportive devices such as crutches, cane, wheelchairs, walkers, the use of special transportation or  the assistance of another person to leave their place of residence. There is a normal inability to leave the home and doing so requires considerable and taxing effort. Other absences are for medical reasons / religious services and are infrequent or of short duration  when for other reasons). If current dressing causes regression in wound condition, may D/C ordered dressing product/s and apply Normal Saline Moist Dressing daily until next Wound Healing Center / Other MD appointment. Notify Wound Healing Center of regression in wound condition at [phone]. Please direct any NON-WOUND related issues/requests for orders to patient's Primary Care Physician Consults ordered were: General Surgery - Removal of Sebaceous Cyst after review today I have recommended: 1. Elevation and exercise as he has got significant lymphedema 2. Prisma AG on the left lower extremity wound [name], [name] ([id number]) 3. He incidentally wanted me to look at his back and I noted a large sebaceous cyst which is not infected and [facility]. Regular visits the wound center Electronic Signature(s) Signed: 11/10/2016 4:41:04 PM By: Evlyn KannerBritto, Iaan Oregel MD, FACS Previous Signature: 11/10/2016 4:08:36 PM Version By: Evlyn KannerBritto, Siris Hoos MD, FACS Entered By: Evlyn KannerBritto, [place] on 11/10/2016 16:41:03 [place], [name] ([id number]) -------------------------------------------------------------------------------- SuperBill Details Patient Name: Trevor LackHUGHES, [name] Date of Service: 11/10/2016 Medical Record Number: [id number] Patient Account Number: [id number] Date of Birth/Sex: 12-Dec-1940 [age] y.o. Male) Treating RN: Huel CoventryWoody, Kim Primary Care Provider: Georgann HousekeeperHUSAIN, KARRAR Other Clinician: Referring Provider: Georgann HousekeeperHUSAIN, KARRAR Treating Provider/Extender: Rudene ReBritto, Steven Veazie Weeks in Treatment: 8 Diagnosis Coding ICD-10 Codes Code Description [phone] Laceration without foreign body, left lower leg, initial encounter M70.862 Other soft  tissue disorders related to use, overuse and pressure, left lower leg I89.0 Lymphedema, not elsewhere classified L97.322 Non-pressure chronic ulcer of left ankle with fat layer exposed L03.116 Cellulitis of left lower limb S81.811A Laceration without foreign body, right lower leg, initial encounter Facility Procedures CPT4 Code: [id number] Description: 99213 - WOUND CARE VISIT-LEV 3 EST PT Modifier: Quantity: 1 Physician Procedures CPT4: Description Modifier Quantity Code [id number] 99213 - WC PHYS LEVEL 3 - EST PT 1 ICD-10 Description Diagnosis S81.812A Laceration without foreign body, left lower leg, initial encounter I89.0 Lymphedema, not elsewhere classified M70.862 Other soft  tissue disorders related to use, overuse and pressure, left lower leg Electronic Signature(s) Signed: 11/10/2016 4:29:15 PM By: Elliot GurneyWoody, BSN, RN, CWS, Kim RN, BSN Signed: 11/10/2016 4:39:35 PM By: Evlyn KannerBritto, Thamas Appleyard MD, FACS Previous Signature: 11/10/2016 4:08:55 PM Version By: Evlyn KannerBritto, Rateel Beldin MD, FACS Entered By: Elliot GurneyWoody, BSN, RN, CWS, Kim on 11/10/2016 16:29:15

## 2016-11-14 DIAGNOSIS — S81812D Laceration without foreign body, left lower leg, subsequent encounter: Secondary | ICD-10-CM | POA: Diagnosis not present

## 2016-11-14 DIAGNOSIS — I890 Lymphedema, not elsewhere classified: Secondary | ICD-10-CM | POA: Diagnosis not present

## 2016-11-14 DIAGNOSIS — L03116 Cellulitis of left lower limb: Secondary | ICD-10-CM | POA: Diagnosis not present

## 2016-11-17 ENCOUNTER — Encounter: Payer: PPO | Admitting: Surgery

## 2016-11-17 DIAGNOSIS — S81812A Laceration without foreign body, left lower leg, initial encounter: Secondary | ICD-10-CM | POA: Diagnosis not present

## 2016-11-17 DIAGNOSIS — I890 Lymphedema, not elsewhere classified: Secondary | ICD-10-CM | POA: Diagnosis not present

## 2016-11-17 DIAGNOSIS — Z87898 Personal history of other specified conditions: Secondary | ICD-10-CM | POA: Diagnosis not present

## 2016-11-20 NOTE — Progress Notes (Signed)
[name], [name] ([id number]) Visit Report for 11/17/2016 Arrival Information Details Patient Name: [name], [name] Date of Service: 11/17/2016 9:45 AM Medical Record Number: [id number] Patient Account Number: [id number] Date of Birth/Sex: 16-Apr-1941 [age] y.o. Male) Treating RN: Huel CoventryWoody, Kim Primary Care Oluwatosin Bracy: Georgann HousekeeperHUSAIN, KARRAR Other Clinician: Referring Kaytlynn Kochan: Georgann HousekeeperHUSAIN, KARRAR Treating Lashun Mccants/Extender: Rudene ReBritto, Errol Weeks in Treatment: 9 Visit Information History Since Last Visit Added or deleted any medications: No Patient Arrived: Ambulatory Any new allergies or adverse reactions: No Arrival Time: 09:51 Had a fall or experienced change in No Accompanied By: self activities of daily living that may affect Transfer Assistance: None risk of falls: Patient Identification Verified: Yes Signs or symptoms of abuse/neglect No Secondary Verification Process Yes since last visito Completed: Hospitalized since last visit: No Patient Requires Transmission- No Has Dressing in Place as Prescribed: Yes Based Precautions: Has Compression in Place as Yes Patient Has Alerts: Yes Prescribed: Patient Alerts: L ABI non- Has Footwear/Offloading in Place as No compressible Prescribed: R ABI non- Left: Surgical Shoe with compressible Pressure Relief Insole Right: Surgical Shoe with Pressure Relief Insole Pain Present Now: No Electronic Signature(s) Signed: 11/18/2016 4:29:04 PM By: Elliot GurneyWoody, BSN, RN, CWS, Kim RN, BSN Entered By: Elliot GurneyWoody, BSN, RN, CWS, Kim on 11/17/2016 09:51:31 BlackeyHUGHES, [name] ([id number]) -------------------------------------------------------------------------------- Clinic Level of Care Assessment Details Patient Name: [name], [name] Date of Service: 11/17/2016 9:45 AM Medical Record Number: [id number] Patient Account Number: [id number] Date of Birth/Sex: 16-Apr-1941 [age] y.o. Male) Treating RN: Huel CoventryWoody, Kim Primary Care Beautiful Pensyl: Georgann HousekeeperHUSAIN, KARRAR Other Clinician: Referring Charlis Harner:  Georgann HousekeeperHUSAIN, KARRAR Treating Starlette Thurow/Extender: Rudene ReBritto, Errol Weeks in Treatment: 9 Clinic Level of Care Assessment Items TOOL 4 Quantity Score []  - Use when only an EandM is performed on FOLLOW-UP visit 0 ASSESSMENTS - Nursing Assessment / Reassessment []  - Reassessment of Co-morbidities (includes updates in patient status) 0 X - Reassessment of Adherence to Treatment Plan 1 5 ASSESSMENTS - Wound and Skin Assessment / Reassessment X - Simple Wound Assessment / Reassessment - one wound 1 5 []  - Complex Wound Assessment / Reassessment - multiple wounds 0 []  - Dermatologic / Skin Assessment (not related to wound area) 0 ASSESSMENTS - Focused Assessment []  - Circumferential Edema Measurements - multi extremities 0 []  - Nutritional Assessment / Counseling / Intervention 0 []  - Lower Extremity Assessment (monofilament, tuning fork, pulses) 0 []  - Peripheral Arterial Disease Assessment (using hand held doppler) 0 ASSESSMENTS - Ostomy and/or Continence Assessment and Care []  - Incontinence Assessment and Management 0 []  - Ostomy Care Assessment and Management (repouching, etc.) 0 PROCESS - Coordination of Care X - Simple Patient / Family Education for ongoing care 1 15 []  - Complex (extensive) Patient / Family Education for ongoing care 0 X - Staff obtains ChiropractorConsents, Records, Test Results / Process Orders 1 10 []  - Staff telephones HHA, Nursing Homes / Clarify orders / etc 0 []  - Routine Transfer to another Facility (non-emergent condition) 0 [place], [name] ([id number]) []  - Routine Hospital Admission (non-emergent condition) 0 []  - New Admissions / Manufacturing engineernsurance Authorizations / Ordering NPWT, Apligraf, etc. 0 []  - Emergency Hospital Admission (emergent condition) 0 X - Simple Discharge Coordination 1 10 []  - Complex (extensive) Discharge Coordination 0 PROCESS - Special Needs []  - Pediatric / Minor Patient Management 0 []  - Isolation Patient Management 0 []  - Hearing / Language / Visual special  needs 0 []  - Assessment of Community assistance (transportation, D/C planning, etc.) 0 []  - Additional assistance / Altered mentation 0 []  - Support Surface(s) Assessment (bed, cushion, seat,  etc.) 0 INTERVENTIONS - Wound Cleansing / Measurement X - Simple Wound Cleansing - one wound 1 5 []  - Complex Wound Cleansing - multiple wounds 0 X - Wound Imaging (photographs - any number of wounds) 1 5 []  - Wound Tracing (instead of photographs) 0 X - Simple Wound Measurement - one wound 1 5 []  - Complex Wound Measurement - multiple wounds 0 INTERVENTIONS - Wound Dressings []  - Small Wound Dressing one or multiple wounds 0 X - Medium Wound Dressing one or multiple wounds 1 15 []  - Large Wound Dressing one or multiple wounds 0 []  - Application of Medications - topical 0 []  - Application of Medications - injection 0 INTERVENTIONS - Miscellaneous []  - External ear exam 0 [name], [name] ([id number]) []  - Specimen Collection (cultures, biopsies, blood, body fluids, etc.) 0 []  - Specimen(s) / Culture(s) sent or taken to Lab for analysis 0 []  - Patient Transfer (multiple staff / Nurse, adultHoyer Lift / Similar devices) 0 []  - Simple Staple / Suture removal (25 or less) 0 []  - Complex Staple / Suture removal (26 or more) 0 []  - Hypo / Hyperglycemic Management (close monitor of Blood Glucose) 0 []  - Ankle / Brachial Index (ABI) - do not check if billed separately 0 X - Vital Signs 1 5 Has the patient been seen at the hospital within the last three years: Yes Total Score: 80 Level Of Care: New/Established - Level 3 Electronic Signature(s) Signed: 11/18/2016 4:29:04 PM By: Elliot GurneyWoody, BSN, RN, CWS, Kim RN, BSN Entered By: Elliot GurneyWoody, BSN, RN, CWS, Kim on 11/17/2016 10:10:10 [name], [name] ([id number]) -------------------------------------------------------------------------------- Encounter Discharge Information Details Patient Name: [name], [name] Date of Service: 11/17/2016 9:45 AM Medical Record Number: [id number] Patient  Account Number: [id number] Date of Birth/Sex: May 12, 1940 [age] y.o. Male) Treating RN: Phillis HaggisPinkerton, Debi Primary Care Javante Nilsson: Georgann HousekeeperHUSAIN, KARRAR Other Clinician: Referring Rolen Conger: Georgann HousekeeperHUSAIN, KARRAR Treating Madisson Kulaga/Extender: Rudene ReBritto, Errol Weeks in Treatment: 9 Encounter Discharge Information Items Discharge Pain Level: 0 Discharge Condition: Stable Ambulatory Status: Ambulatory Discharge Destination: Home Transportation: Private Auto Accompanied By: self Schedule Follow-up Appointment: Yes Medication Reconciliation completed and provided to Patient/Care Yes Jeffery Gammell: Provided on Clinical Summary of Care: 11/17/2016 Form Type Recipient Paper Patient MH Electronic Signature(s) Signed: 11/18/2016 4:29:04 PM By: Elliot GurneyWoody, BSN, RN, CWS, Kim RN, BSN Previous Signature: 11/17/2016 10:22:24 AM Version By: Gwenlyn PerkingMoore, Shelia Entered By: Elliot GurneyWoody, BSN, RN, CWS, Kim on 11/17/2016 10:23:37 [name], [name] ([id number]) -------------------------------------------------------------------------------- Lower Extremity Assessment Details Patient Name: [name], [name] Date of Service: 11/17/2016 9:45 AM Medical Record Number: [id number] Patient Account Number: [id number] Date of Birth/Sex: May 12, 1940 (75 y.o. Male) Treating RN: Huel CoventryWoody, Kim Primary Care Trinadee Verhagen: Georgann HousekeeperHUSAIN, KARRAR Other Clinician: Referring Oceane Fosse: Georgann HousekeeperHUSAIN, KARRAR Treating Darletta Noblett/Extender: Rudene ReBritto, Errol Weeks in Treatment: 9 Edema Assessment Assessed: [Left: No] [Right: No] E[Left: dema] [Right: :] Calf Left: Right: Point of Measurement: 35 cm From Medial Instep 35.7 cm cm Ankle Left: Right: Point of Measurement: 13 cm From Medial Instep 25.4 cm cm Vascular Assessment Pulses: Dorsalis Pedis Palpable: [Left:Yes] Posterior Tibial Palpable: [Left:Yes] Extremity colors, hair growth, and conditions: Extremity Color: [Left:Mottled] Hair Growth on Extremity: [Left:No] Temperature of Extremity: [Left:Warm] Capillary Refill: [Left:< 3  seconds] Dependent Rubor: [Left:No] Lipodermatosclerosis: [Left:No] Toe Nail Assessment Left: Right: Thick: No Discolored: No Deformed: No Improper Length and Hygiene: Yes Electronic Signature(s) Signed: 11/18/2016 4:29:04 PM By: Elliot GurneyWoody, BSN, RN, CWS, Kim RN, BSN [name], [name] ([id number]) Entered By: Elliot GurneyWoody, BSN, RN, CWS, Kim on 11/17/2016 10:02:32 [name], [name] ([id number]) -------------------------------------------------------------------------------- Multi Wound Chart Details Patient Name: [name], [name] Date of  Service: 11/17/2016 9:45 AM Medical Record Number: [id number] Patient Account Number: [id number] Date of Birth/Sex: Apr 16, 1941 (76 y.o. Male) Treating RN: Huel Coventry Primary Care Lissandra Keil: Georgann Housekeeper Other Clinician: Referring Lacosta Hargan: Georgann Housekeeper Treating Logann Whitebread/Extender: Rudene Re in Treatment: 9 Vital Signs Height(in): 72 Pulse(bpm): 65 Weight(lbs): 236 Blood Pressure 146/67 (mmHg): Body Mass Index(BMI): 32 Temperature(F): 97.9 Respiratory Rate 16 (breaths/min): Photos: [N/A:N/A] Wound Location: Left Lower Leg - Lateral N/A N/A Wounding Event: Trauma N/A N/A Primary Etiology: Trauma, Other N/A N/A Comorbid History: Cataracts, Hypertension, N/A N/A Gout Date Acquired: 08/25/2016 N/A N/A Weeks of Treatment: 9 N/A N/A Wound Status: Open N/A N/A Measurements L x W x D 2x1.1x0.3 N/A N/A (cm) Area (cm) : 1.728 N/A N/A Volume (cm) : 0.518 N/A N/A % Reduction in Area: 48.20% N/A N/A % Reduction in Volume: -55.10% N/A N/A Classification: Full Thickness Without N/A N/A Exposed Support Structures Wound Margin: Epibole N/A N/A Granulation Amount: Medium (34-66%) N/A N/A Granulation Quality: Pink N/A N/A Necrotic Amount: Medium (34-66%) N/A N/A Exposed Structures: Fat Layer (Subcutaneous N/A N/A Tissue) Exposed: Yes Fascia: No [place], [name] ([id number]) Tendon: No Muscle: No Joint: No Bone: No Epithelialization: Small (1-33%) N/A  N/A Periwound Skin Texture: Induration: Yes N/A N/A Scarring: Yes Excoriation: No Callus: No Crepitus: No Rash: No Periwound Skin Maceration: No N/A N/A Moisture: Dry/Scaly: No Periwound Skin Color: Erythema: Yes N/A N/A Atrophie Blanche: No Cyanosis: No Ecchymosis: No Hemosiderin Staining: No Mottled: No Pallor: No Rubor: No Erythema Location: Circumferential N/A N/A Tenderness on No N/A N/A Palpation: Wound Preparation: Ulcer Cleansing: N/A N/A Rinsed/Irrigated with Saline Topical Anesthetic Applied: Other: lidociane 4% Treatment Notes Electronic Signature(s) Signed: 11/17/2016 10:13:58 AM By: Evlyn Kanner MD, FACS Entered By: Evlyn Kanner on 11/17/2016 10:13:58 [name], [name] ([id number]) -------------------------------------------------------------------------------- Multi-Disciplinary Care Plan Details Patient Name: Trevor Lack Date of Service: 11/17/2016 9:45 AM Medical Record Number: [id number] Patient Account Number: [id number] Date of Birth/Sex: 1940-09-11 (76 y.o. Male) Treating RN: Huel Coventry Primary Care Chezney Huether: Georgann Housekeeper Other Clinician: Referring Eythan Jayne: Georgann Housekeeper Treating Rashika Bettes/Extender: Rudene Re in Treatment: 9 Active Inactive ` Abuse / Safety / Falls / Self Care Management Nursing Diagnoses: History of Falls Potential for falls Goals: Patient will remain injury free related to falls Date Initiated: 09/15/2016 Target Resolution Date: 11/26/2016 Goal Status: Active Interventions: Assess fall risk on admission and as needed Assess impairment of mobility on admission and as needed per policy Notes: ` Nutrition Nursing Diagnoses: Imbalanced nutrition Potential for alteratiion in Nutrition/Potential for imbalanced nutrition Goals: Patient/caregiver agrees to and verbalizes understanding of need to use nutritional supplements and/or vitamins as prescribed Date Initiated: 09/15/2016 Target Resolution Date:  12/31/2016 Goal Status: Active Interventions: Assess patient nutrition upon admission and as needed per policy Notes: ` Orientation to the Wound Care Program [place] ([id number]) Nursing Diagnoses: Knowledge deficit related to the wound healing center program Goals: Patient/caregiver will verbalize understanding of the Wound Healing Center Program Date Initiated: 09/15/2016 Target Resolution Date: 10/01/2016 Goal Status: Active Interventions: Provide education on orientation to the wound center Notes: ` Pain, Acute or Chronic Nursing Diagnoses: Pain, acute or chronic: actual or potential Potential alteration in comfort, pain Goals: Patient/caregiver will verbalize adequate pain control between visits Date Initiated: 09/15/2016 Target Resolution Date: 12/31/2016 Goal Status: Active Interventions: Complete pain assessment as per visit requirements Notes: ` Wound/Skin Impairment Nursing Diagnoses: Impaired tissue integrity Knowledge deficit related to ulceration/compromised skin integrity Goals: Ulcer/skin breakdown will have a volume reduction of 80% by week 12 Date Initiated: 09/15/2016  Target Resolution Date: 12/24/2016 Goal Status: Active Interventions: Assess patient/caregiver ability to perform ulcer/skin care regimen upon admission and as needed Notes: DAIDEN, COLTRANE ([id number]) Electronic Signature(s) Signed: 11/18/2016 4:29:04 PM By: Elliot Gurney, BSN, RN, CWS, Kim RN, BSN Entered By: Elliot Gurney, BSN, RN, CWS, Kim on 11/17/2016 10:02:44 [name], [name] ([id number]) -------------------------------------------------------------------------------- Pain Assessment Details Patient Name: Trevor Lack Date of Service: 11/17/2016 9:45 AM Medical Record Number: [id number] Patient Account Number: [id number] Date of Birth/Sex: 03-Dec-1940 (75 y.o. Male) Treating RN: Huel Coventry Primary Care Ceil Roderick: Georgann Housekeeper Other Clinician: Referring Tresa Jolley: Georgann Housekeeper Treating Gladis Soley/Extender:  Rudene Re in Treatment: 9 Active Problems Location of Pain Severity and Description of Pain Patient Has Paino No Site Locations With Dressing Change: No Pain Management and Medication Current Pain Management: Goals for Pain Management Topical or injectable lidocaine is offered to patient for acute pain when surgical debridement is performed. If needed, Patient is instructed to use over the counter pain medication for the following 24-48 hours after debridement. Wound care MDs do not prescribed pain medications. Patient has chronic pain or uncontrolled pain. Patient has been instructed to make an appointment with their Primary Care Physician for pain management. Electronic Signature(s) Signed: 11/18/2016 4:29:04 PM By: Elliot Gurney, BSN, RN, CWS, Kim RN, BSN Entered By: Elliot Gurney, BSN, RN, CWS, Kim on 11/17/2016 09:51:40 [name], [name] ([id number]) -------------------------------------------------------------------------------- Patient/Caregiver Education Details Patient Name: Trevor Lack Date of Service: 11/17/2016 9:45 AM Medical Record Number: [id number] Patient Account Number: [id number] Date of Birth/Gender: 27-Jul-1940 (75 y.o. Male) Treating RN: Huel Coventry Primary Care Physician: Georgann Housekeeper Other Clinician: Referring Physician: Georgann Housekeeper Treating Physician/Extender: Rudene Re in Treatment: 9 Education Assessment Education Provided To: Patient Education Topics Provided Wound/Skin Impairment: Handouts: Caring for Your Ulcer Methods: Demonstration Responses: State content correctly Electronic Signature(s) Signed: 11/18/2016 4:29:04 PM By: Elliot Gurney, BSN, RN, CWS, Kim RN, BSN Entered By: Elliot Gurney, BSN, RN, CWS, Kim on 11/17/2016 10:23:53 [place], [name] ([id number]) -------------------------------------------------------------------------------- Wound Assessment Details Patient Name: Trevor Lack Date of Service: 11/17/2016 9:45 AM Medical Record Number:  [id number] Patient Account Number: [id number] Date of Birth/Sex: [birth year] (76 y.o. Male) Treating RN: Huel Coventry Primary Care Lise Pincus: Georgann Housekeeper Other Clinician: Referring Jalesia Loudenslager: Georgann Housekeeper Treating Chamille Werntz/Extender: Rudene Re in Treatment: 9 Wound Status Wound Number: 1 Primary Etiology: Trauma, Other Wound Location: Left Lower Leg - Lateral Wound Status: Open Wounding Event: Trauma Comorbid History: Cataracts, Hypertension, Gout Date Acquired: 08/25/2016 Weeks Of Treatment: 9 Clustered Wound: No Photos Wound Measurements Length: (cm) 2 Width: (cm) 1.1 Depth: (cm) 0.3 Area: (cm) 1.728 Volume: (cm) 0.518 % Reduction in Area: 48.2% % Reduction in Volume: -55.1% Epithelialization: Small (1-33%) Tunneling: No Undermining: No Wound Description Full Thickness Without Exposed Classification: Support Structures Wound Epibole Margin: Wound Bed Granulation Amount: Medium (34-66%) Exposed Structure Granulation Quality: Pink Fascia Exposed: No Necrotic Amount: Medium (34-66%) Fat Layer (Subcutaneous Tissue) Exposed: Yes Necrotic Quality: Adherent Slough Tendon Exposed: No Muscle Exposed: No Joint Exposed: No Bone Exposed: No Periwound Skin Texture Trolinger, [name] ([id number]) Texture Color No Abnormalities Noted: No No Abnormalities Noted: No Callus: No Atrophie Blanche: No Crepitus: No Cyanosis: No Excoriation: No Ecchymosis: No Induration: Yes Erythema: Yes Rash: No Erythema Location: Circumferential Scarring: Yes Hemosiderin Staining: No Mottled: No Moisture Pallor: No No Abnormalities Noted: No Rubor: No Dry / Scaly: No Maceration: No Wound Preparation Ulcer Cleansing: Rinsed/Irrigated with Saline Topical Anesthetic Applied: Other: lidociane 4%, Treatment Notes Wound #1 (Left, Lateral Lower Leg) 1. Cleansed with: Clean wound with Normal Saline 2. Anesthetic Topical Lidocaine  4% cream to wound bed prior to debridement 4.  Dressing Applied: Prisma Ag Notes Kerlix and coban unna to Ecologist) Signed: 11/18/2016 4:29:04 PM By: Elliot Gurney, BSN, RN, CWS, Kim RN, BSN Entered By: Elliot Gurney, BSN, RN, CWS, Kim on 11/17/2016 10:00:51 [place], [name] ([id number]) -------------------------------------------------------------------------------- Vitals Details Patient Name: Trevor Lack Date of Service: 11/17/2016 9:45 AM Medical Record Number: [id number] Patient Account Number: [id number] Date of Birth/Sex: Aug 31, 1940 (75 y.o. Male) Treating RN: Huel Coventry Primary Care Judie Hollick: Georgann Housekeeper Other Clinician: Referring Bryttney Netzer: Georgann Housekeeper Treating Lenora Gomes/Extender: Rudene Re in Treatment: 9 Vital Signs Time Taken: 09:51 Temperature (F): 97.9 Height (in): 72 Pulse (bpm): 65 Weight (lbs): 236 Respiratory Rate (breaths/min): 16 Body Mass Index (BMI): 32 Blood Pressure (mmHg): 146/67 Reference Range: 80 - 120 mg / dl Electronic Signature(s) Signed: 11/18/2016 4:29:04 PM By: Elliot Gurney, BSN, RN, CWS, Kim RN, BSN Entered By: Elliot Gurney, BSN, RN, CWS, Kim on 11/17/2016 09:52:00

## 2016-11-21 DIAGNOSIS — Z87898 Personal history of other specified conditions: Secondary | ICD-10-CM | POA: Insufficient documentation

## 2016-11-21 DIAGNOSIS — Z8739 Personal history of other diseases of the musculoskeletal system and connective tissue: Secondary | ICD-10-CM

## 2016-11-21 DIAGNOSIS — Z87891 Personal history of nicotine dependence: Secondary | ICD-10-CM | POA: Diagnosis not present

## 2016-11-21 DIAGNOSIS — L03116 Cellulitis of left lower limb: Secondary | ICD-10-CM | POA: Diagnosis not present

## 2016-11-21 DIAGNOSIS — I890 Lymphedema, not elsewhere classified: Secondary | ICD-10-CM | POA: Diagnosis not present

## 2016-11-21 DIAGNOSIS — E6609 Other obesity due to excess calories: Secondary | ICD-10-CM | POA: Diagnosis not present

## 2016-11-21 DIAGNOSIS — M109 Gout, unspecified: Secondary | ICD-10-CM | POA: Diagnosis not present

## 2016-11-21 DIAGNOSIS — Z791 Long term (current) use of non-steroidal anti-inflammatories (NSAID): Secondary | ICD-10-CM | POA: Diagnosis not present

## 2016-11-21 DIAGNOSIS — I10 Essential (primary) hypertension: Secondary | ICD-10-CM | POA: Diagnosis not present

## 2016-11-21 DIAGNOSIS — R600 Localized edema: Secondary | ICD-10-CM | POA: Insufficient documentation

## 2016-11-21 DIAGNOSIS — S81812D Laceration without foreign body, left lower leg, subsequent encounter: Secondary | ICD-10-CM | POA: Diagnosis not present

## 2016-11-21 DIAGNOSIS — Z6832 Body mass index (BMI) 32.0-32.9, adult: Secondary | ICD-10-CM | POA: Diagnosis not present

## 2016-11-21 NOTE — Progress Notes (Signed)
[name], [name] ([id number]) Visit Report for 11/17/2016 Chief Complaint Document Details Patient Name: [name], [name] Date of Service: 11/17/2016 9:45 AM Medical Record Number: [id number] Patient Account Number: [id number] Date of Birth/Sex: [birth year] [age] y.o. Male) Treating RN: [name] Primary Care Provider: Georgann HousekeeperHUSAIN, Greene Other Clinician: Referring Provider: Georgann HousekeeperHUSAIN, Greene Treating Provider/Extender: [name] Weeks in Treatment: 9 Information Obtained from: Patient Chief Complaint Patient seen for complaints of Non-Healing Wound left lower lateral leg Electronic Signature(s) Signed: 11/17/2016 10:14:10 AM By: Trevor KannerBritto, Aryn Safran MD, FACS Entered By: [name] on 11/17/2016 10:14:10 ShevlinHUGHES, [name] -------------------------------------------------------------------------------- HPI Details Patient Name: [name], [name] Date of Service: 11/17/2016 9:45 AM Medical Record Number: [id number] Patient Account Number: [id number] Date of Birth/Sex: [birth year] [age] y.o. Male) Treating RN: [name] Primary Care Provider: Georgann HousekeeperHUSAIN, Greene Other Clinician: Referring Provider: Georgann HousekeeperHUSAIN, Greene Treating Provider/Extender: [name] Weeks in Treatment: 9 History of Present Illness Location: left lower lateral leg Quality: Patient reports experiencing a sharp pain to affected area(s). Severity: Patient states wound are getting worse. Duration: Patient has had the wound for < 4 weeks prior to presenting for treatment Timing: Pain in wound is Intermittent (comes and goes Context: The wound occurred when the patient tripped and had a fall and injured his left leg Modifying Factors: Other treatment(s) tried include:had gone to an urgent care couple of times a day taken an x-ray and was put on antibiotics Associated Signs and Symptoms: Patient reports having increase swelling and redness to the leg HPI Description: 76 year old gentleman who had a injury to the left ankle and foot 3  weeks ago was seen recently in the ER after he had swelling and redness of the left ankle. He is also known to have a flareup of gout recently. After he was examined in the urgent care he was placed on Keflex and Bactrim and referred to the wound center. A recent x-ray of the left ankle did not show any fractures or abnormality. The patient doesn't take very good care of his health has never been worked up for chronic lymphedema which she's had and sees a physician about once a year. He has not been aware that he has bilateral lower extremity lymphedema. he does not smoke. He has never had a workup for his lymphedema. 09/29/2016 -- the patient's insurance will not cover the snap vacuum system and he is finding it difficult to get home health as his copayment is $25 each time. He will not be able to afford the [employer] wound VAC either. 10/14/16 patient presents today for fault evaluation concerning his left lateral ankle wound. Unfortunately he has a skin tear which occurred on Monday of this week over the interior shin of his right lower extremity. Fortunately he did we approximate the flap as well as she could and I think this has done fairly well. Nonetheless he continues to have some discomfort at this site which is completely understandable. He is concerned about how well this is going to heal hopefully being that he did catch it early this will heal up quickly. He did question how long this would take I explained that I cannot give him an exact time but hopefully it will not be too long especially since the undermining seems to be filling in rapidly. 10/21/2016 -- besides the fall and the lacerated wound on his right lateral calf the rest of his health has been going okay and I have reviewed his wounds and made appropriate changes 10/28/16 on evaluation today patient appears to be doing well  in regard to the left lateral ankle wound. His right anterior shin wound still had Steri-Strips in place so  it appears to be doing well. Fortunately there's no evidence of infection. 11/03/16 on evaluation today patient's wound on the left lateral ankle appears to be doing better visually with epithelium migrating in from the non-o'clock location as well as the undermining improving significantly. [name], [name] ([id number]) feel like this is overall doing much better. The wound over the anterior shin of his right lower extremity appears to be closed there is no active drainage at this point no evidence of fluctuance underneath and I think this is also healing well. There is no evidence of infection at either site. Electronic Signature(s) Signed: 11/17/2016 10:14:22 AM By: Trevor KannerBritto, Tiron Suski MD, FACS Entered By: Trevor KannerBritto, Merly Hinkson on 11/17/2016 10:14:22 [name], [name] ([id number]) -------------------------------------------------------------------------------- Physical Exam Details Patient Name: [name], [name] Date of Service: 11/17/2016 9:45 AM Medical Record Number: [id number] Patient Account Number: [id number] Date of Birth/Sex: 1941/02/21 [age] y.o. Male) Treating RN: [name] Primary Care Provider: Georgann HousekeeperHUSAIN, Greene Other Clinician: Referring Provider: Georgann HousekeeperHUSAIN, Greene Treating Provider/Extender: Trevor ReBritto, Loriana Samad Weeks in Treatment: 9 Constitutional . Pulse regular. Respirations normal and unlabored. Afebrile. . Eyes Nonicteric. Reactive to light. Ears, Nose, Mouth, and Throat Lips, teeth, and gums WNL.Marland Kitchen. Moist mucosa without lesions. Neck supple and nontender. No palpable supraclavicular or cervical adenopathy. Normal sized without goiter. Respiratory WNL. No retractions.. Breath sounds WNL, No rubs, rales, rhonchi, or wheeze.. Cardiovascular Heart rhythm and rate regular, no murmur or gallop.. Pedal Pulses WNL. No clubbing, cyanosis or edema. Chest Breasts symmetical and no nipple discharge.. Breast tissue WNL, no masses, lumps, or tenderness.. Lymphatic No adneopathy. No adenopathy. No  adenopathy. Musculoskeletal Adexa without tenderness or enlargement.. Digits and nails w/o clubbing, cyanosis, infection, petechiae, ischemia, or inflammatory conditions.. Integumentary (Hair, Skin) No suspicious lesions. No crepitus or fluctuance. No peri-wound warmth or erythema. No masses.Marland Kitchen. Psychiatric Judgement and insight Intact.. No evidence of depression, anxiety, or agitation.. Notes the wound was irrigated with saline and washed out with moist saline gauze and some of the superficial debris removed. He has no undermining and no sharp debridement was required today. Electronic Signature(s) Signed: 11/17/2016 10:14:46 AM By: Trevor KannerBritto, Bary Limbach MD, FACS Entered By: Trevor KannerBritto, Namon Villarin on 11/17/2016 10:14:45 [name], Trevor NovemberMIKE ([id number]) -------------------------------------------------------------------------------- Physician Orders Details Patient Name: [name], [name] Date of Service: 11/17/2016 9:45 AM Medical Record Number: [id number] Patient Account Number: [id number] Date of Birth/Sex: 1941/02/21 7(75 y.o. Male) Treating RN: Huel CoventryWoody, Kim Primary Care Provider: Georgann HousekeeperHUSAIN, Greene Other Clinician: Referring Provider: Georgann HousekeeperHUSAIN, Greene Treating Provider/Extender: Trevor ReBritto, Shelena Castelluccio Weeks in Treatment: 9 Verbal / Phone Orders: No Diagnosis Coding Wound Cleansing Wound #1 Left,Lateral Lower Leg o Clean wound with Normal Saline. o Cleanse wound with mild soap and water Anesthetic Wound #1 Left,Lateral Lower Leg o Topical Lidocaine 4% cream applied to wound bed prior to debridement - for clinic use Skin Barriers/Peri-Wound Care Wound #1 Left,Lateral Lower Leg o Moisturizing lotion - not on wound Primary Wound Dressing Wound #1 Left,Lateral Lower Leg o Prisma Ag - moisten with saline Secondary Dressing Wound #1 Left,Lateral Lower Leg o ABD pad Dressing Change Frequency Wound #1 Left,Lateral Lower Leg o Dressing is to be changed Monday and Thursday. Follow-up Appointments Wound #1  Left,Lateral Lower Leg o Return Appointment in 1 week. Edema Control Wound #1 Left,Lateral Lower Leg o Kerlix and Coban - Left Lower Extremity - 3cm from toes to 3 cm from knee; 1 layer of paste to anchor o Elevate legs to the level  of the heart and pump ankles as often as possible [name], [name] ([id number]) Additional Orders / Instructions Wound #1 Left,Lateral Lower Leg o Increase protein intake. Home Health Wound #1 Left,Lateral Lower Leg o Continue Home Health Visits o Home Health Nurse may visit PRN to address patientos wound care needs. o FACE TO FACE ENCOUNTER: MEDICARE and MEDICAID PATIENTS: I certify that this patient is under my care and that I had a face-to-face encounter that meets the physician face-to-face encounter requirements with this patient on this date. The encounter with the patient was in whole or in part for the following MEDICAL CONDITION: (primary reason for Home Healthcare) MEDICAL NECESSITY: I certify, that based on my findings, NURSING services are a medically necessary home health service. HOME BOUND STATUS: I certify that my clinical findings support that this patient is homebound (i.e., Due to illness or injury, pt requires aid of supportive devices such as crutches, cane, wheelchairs, walkers, the use of special transportation or the assistance of another person to leave their place of residence. There is a normal inability to leave the home and doing so requires considerable and taxing effort. Other absences are for medical reasons / religious services and are infrequent or of short duration when for other reasons). o If current dressing causes regression in wound condition, may D/C ordered dressing product/s and apply Normal Saline Moist Dressing daily until next Wound Healing Center / Other MD appointment. Notify Wound Healing Center of regression in wound condition at [phone]. o Please direct any NON-WOUND related issues/requests  for orders to patient's Primary Care Physician Electronic Signature(s) Signed: 11/17/2016 3:53:48 PM By: Trevor Kanner MD, FACS Signed: 11/18/2016 4:29:04 PM By: Elliot Gurney, BSN, RN, CWS, Kim RN, BSN Entered By: Elliot Gurney, BSN, RN, CWS, Kim on 11/17/2016 10:09:14 [place], [name] ([id number]) -------------------------------------------------------------------------------- Problem List Details Patient Name: Trevor Lack Date of Service: 11/17/2016 9:45 AM Medical Record Number: [id number] Patient Account Number: [id number] Date of Birth/Sex: May 24, 1940 (76 y.o. 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 Inactive Problems Resolved Problems ICD-10 Code Description Active Date Resolved Date S81.811A Laceration without foreign body, right lower leg, initial 10/21/2016 10/21/2016 encounter L03.116 Cellulitis of left lower limb 09/15/2016 09/15/2016 Electronic Signature(s) Signed: 11/17/2016 10:13:52 AM By: Trevor Kanner MD, FACS Entered By: Trevor Kanner on 11/17/2016 10:13:52 [place], [name] ([id number]) [name], [name] ([id number]) -------------------------------------------------------------------------------- Progress Note Details Patient Name: Trevor Lack Date of Service: 11/17/2016 9:45 AM Medical Record Number: [id number] Patient Account Number: [id number] Date of Birth/Sex: July 21, 1940 (76 y.o. Male) Treating RN: Trevor Haggis Primary Care Provider:  Georgann Greene Other Clinician: Referring Provider: Georgann Greene Treating Provider/Extender: Trevor Re in Treatment: 9 Subjective Chief Complaint Information obtained from Patient Patient seen for complaints of Non-Healing Wound left lower lateral leg History of Present Illness (HPI) The following HPI elements were documented for the patient's wound: Location: left lower lateral leg Quality: Patient reports experiencing a sharp pain to affected area(s). Severity: Patient states wound are getting worse. Duration: Patient has had the wound for < 4 weeks prior to presenting for treatment Timing: Pain in wound is Intermittent (  comes and goes Context: The wound occurred when the patient tripped and had a fall and injured his left leg Modifying Factors: Other treatment(s) tried include:had gone to an urgent care couple of times a day taken an x-ray and was put on antibiotics Associated Signs and Symptoms: Patient reports having increase swelling and redness to the leg 76 year old gentleman who had a injury to the left ankle and foot 3 weeks ago was seen recently in the ER after he had swelling and redness of the left ankle. He is also known to have a flareup of gout recently. After he was examined in the urgent care he was placed on Keflex and Bactrim and referred to the wound center. A recent x-ray of the left ankle did not show any fractures or abnormality. The patient doesn't take very good care of his health has never been worked up for chronic lymphedema which she's had and sees a physician about once a year. He has not been aware that he has bilateral lower extremity lymphedema. he does not smoke. He has never had a workup for his lymphedema. 09/29/2016 -- the patient's insurance will not cover the snap vacuum system and he is finding it difficult to get home health as his copayment is $25 each time. He will not be able to afford the [employer] wound VAC either. 10/14/16 patient  presents today for fault evaluation concerning his left lateral ankle wound. Unfortunately he has a skin tear which occurred on Monday of this week over the interior shin of his right lower extremity. Fortunately he did we approximate the flap as well as she could and I think this has done fairly well. Nonetheless he continues to have some discomfort at this site which is completely understandable. He is concerned about how well this is going to heal hopefully being that he did catch it early this will heal up quickly. He did question how long this would take I explained that I cannot give him an exact time but hopefully it will not be too long especially since the undermining seems to be filling in rapidly. 10/21/2016 -- besides the fall and the lacerated wound on his right lateral calf the rest of his health has [name], [name] ([id number]) been going okay and I have reviewed his wounds and made appropriate changes 10/28/16 on evaluation today patient appears to be doing well in regard to the left lateral ankle wound. His right anterior shin wound still had Steri-Strips in place so it appears to be doing well. Fortunately there's no evidence of infection. 11/03/16 on evaluation today patient's wound on the left lateral ankle appears to be doing better visually with epithelium migrating in from the non-o'clock location as well as the undermining improving significantly. I feel like this is overall doing much better. The wound over the anterior shin of his right lower extremity appears to be closed there is no active drainage at this point no evidence of fluctuance underneath and I think this is also healing well. There is no evidence of infection at either site. Objective Constitutional Pulse regular. Respirations normal and unlabored. Afebrile. Vitals Time Taken: 9:51 AM, Height: 72 in, Weight: 236 lbs, BMI: 32, Temperature: 97.9 F, Pulse: 65 bpm, Respiratory Rate: 16 breaths/min, Blood Pressure:  146/67 mmHg. Eyes Nonicteric. Reactive to light. Ears, Nose, Mouth, and Throat Lips, teeth, and gums WNL.Marland Kitchen Moist mucosa without lesions. Neck supple and nontender. No palpable supraclavicular or cervical adenopathy. Normal sized without goiter. Respiratory WNL. No retractions.. Breath sounds WNL,  No rubs, rales, rhonchi, or wheeze.. Cardiovascular Heart rhythm and rate regular, no murmur or gallop.. Pedal Pulses WNL. No clubbing, cyanosis or edema. Chest Breasts symmetical and no nipple discharge.. Breast tissue WNL, no masses, lumps, or tenderness.. Lymphatic No adneopathy. No adenopathy. No adenopathy. [name], [name] ([id number]) Musculoskeletal Adexa without tenderness or enlargement.. Digits and nails w/o clubbing, cyanosis, infection, petechiae, ischemia, or inflammatory conditions.Marland Kitchen Psychiatric Judgement and insight Intact.. No evidence of depression, anxiety, or agitation.. General Notes: the wound was irrigated with saline and washed out with moist saline gauze and some of the superficial debris removed. He has no undermining and no sharp debridement was required today. Integumentary (Hair, Skin) No suspicious lesions. No crepitus or fluctuance. No peri-wound warmth or erythema. No masses.. Wound #1 status is Open. Original cause of wound was Trauma. The wound is located on the Left,Lateral Lower Leg. The wound measures 2cm length x 1.1cm width x 0.3cm depth; 1.728cm^2 area and 0.518cm^3 volume. There is Fat Layer (Subcutaneous Tissue) Exposed exposed. There is no tunneling or undermining noted. The wound margin is epibole. There is medium (34-66%) pink granulation within the wound bed. There is a medium (34-66%) amount of necrotic tissue within the wound bed including Adherent Slough. The periwound skin appearance exhibited: Induration, Scarring, Erythema. The periwound skin appearance did not exhibit: Callus, Crepitus, Excoriation, Rash, Dry/Scaly, Maceration, Atrophie Blanche,  Cyanosis, Ecchymosis, Hemosiderin Staining, Mottled, Pallor, Rubor. The surrounding wound skin color is noted with erythema which is circumferential. 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 Cleanse wound with mild soap and water Anesthetic: Wound #1 Left,Lateral Lower Leg: [name], [name] ([id number]) Topical Lidocaine 4% cream applied to wound bed prior to debridement - for clinic use Skin Barriers/Peri-Wound Care: Wound #1 Left,Lateral Lower Leg: Moisturizing lotion - not on wound Primary Wound Dressing: Wound #1 Left,Lateral Lower Leg: Prisma Ag - moisten with saline Secondary Dressing: Wound #1 Left,Lateral Lower Leg: ABD pad Dressing Change Frequency: Wound #1 Left,Lateral Lower Leg: Dressing is to be changed Monday and Thursday. Follow-up Appointments: Wound #1 Left,Lateral Lower Leg: Return Appointment in 1 week. Edema Control: Wound #1 Left,Lateral Lower Leg: Kerlix and Coban - Left Lower Extremity - 3cm from toes to 3 cm from knee; 1 layer of paste to anchor Elevate legs to the level of the heart and pump ankles as often as possible Additional Orders / Instructions: Wound #1 Left,Lateral Lower Leg: Increase protein intake. Home Health: Wound #1 Left,Lateral Lower Leg: Continue Home Health Visits Home Health Nurse may visit PRN to address patient s wound care needs. FACE TO FACE ENCOUNTER: MEDICARE and MEDICAID PATIENTS: I certify that this patient is under my care and that I had a face-to-face encounter that meets the physician face-to-face encounter requirements with this patient on this date. The  encounter with the patient was in whole or in part for the following MEDICAL CONDITION: (primary reason for Home Healthcare) MEDICAL NECESSITY: I certify, that based on my findings, NURSING services are a medically necessary home health service. HOME BOUND STATUS: I certify that my clinical findings support that this patient is homebound (i.e., Due to illness or injury, pt requires aid of supportive devices such as crutches, cane, wheelchairs,  walkers, the use of special transportation or the assistance of another person to leave their place of residence. There is a normal inability to leave the home and doing so requires considerable and taxing effort. Other absences are for medical reasons / religious services and are infrequent or of short duration when for other reasons). If current dressing causes regression in wound condition, may D/C ordered dressing product/s and apply Normal Saline Moist Dressing daily until next Wound Healing Center / Other MD appointment. Notify Wound Healing Center of regression in wound condition at [phone]. Please direct any NON-WOUND related issues/requests for orders to patient's Primary Care Physician after review today I have recommended: 1. Elevation and exercise as he has got significant lymphedema [name], [name] ([id number]) 2. Prisma AG on the left lower extremity wound. 3. Regular visits the wound center Electronic Signature(s) Signed: 11/17/2016 10:15:28 AM By: Trevor Kanner MD, FACS Entered By: Trevor Kanner on 11/17/2016 10:15:28 [name], [name] ([id number]) -------------------------------------------------------------------------------- SuperBill Details Patient Name: Trevor Lack Date of Service: 11/17/2016 Medical Record Number: [id number] Patient Account Number: [id number] Date of Birth/Sex: [birth year] (76 y.o. Male) Treating RN: Trevor Haggis Primary Care Provider: Georgann Greene Other Clinician: Referring Provider: Georgann Greene Treating  Provider/Extender: Trevor Re in Treatment: 9 Diagnosis Coding ICD-10 Codes Code Description [phone] Laceration without foreign body, left lower leg, initial encounter [phone] Other soft tissue disorders related to use, overuse and pressure, left lower leg I89.0 Lymphedema, not elsewhere classified L97.322 Non-pressure chronic ulcer of left ankle with fat layer exposed Facility Procedures CPT4 Code: [id number] Description: 99213 - WOUND CARE VISIT-LEV 3 EST PT Modifier: Quantity: 1 Physician Procedures CPT4: Description Modifier Quantity Code [id number] 99213 - WC PHYS LEVEL 3 - EST PT 1 ICD-10 Description Diagnosis S81.812A Laceration without foreign body, left lower leg, initial encounter M70.862 Other soft tissue disorders related to use, overuse and  pressure, left lower leg I89.0 Lymphedema, not elsewhere classified L97.322 Non-pressure chronic ulcer of left ankle with fat layer exposed Electronic Signature(s) Signed: 11/17/2016 10:15:43 AM By: Trevor Kanner MD, FACS Entered By: Trevor Kanner on 11/17/2016 10:15:43

## 2016-11-23 ENCOUNTER — Encounter: Payer: Self-pay | Admitting: Surgery

## 2016-11-23 ENCOUNTER — Other Ambulatory Visit
Admission: RE | Admit: 2016-11-23 | Discharge: 2016-11-23 | Disposition: A | Discharge status: Home / Self Care | Payer: PPO | Source: Ambulatory Visit | Attending: Surgery | Admitting: Surgery

## 2016-11-23 ENCOUNTER — Ambulatory Visit (INDEPENDENT_AMBULATORY_CARE_PROVIDER_SITE_OTHER): Payer: PPO | Admitting: Surgery

## 2016-11-23 VITALS — BP 135/88 | HR 78 | Temp 97.9°F | Ht 72.0 in | Wt 235.0 lb

## 2016-11-23 DIAGNOSIS — L089 Local infection of the skin and subcutaneous tissue, unspecified: Secondary | ICD-10-CM | POA: Diagnosis not present

## 2016-11-23 DIAGNOSIS — L723 Sebaceous cyst: Secondary | ICD-10-CM | POA: Insufficient documentation

## 2016-11-23 MED ORDER — CEPHALEXIN 500 MG PO CAPS: 500.0000 mg | ORAL_CAPSULE | Freq: Four times a day (QID) | ORAL | 0 refills | Status: DC

## 2016-11-23 NOTE — Progress Notes (Signed)
Surgical Consultation  11/23/2016  [name] is an 76 y.o. male.   CC: Sebaceous cyst on back  HPI: This a patient referred over from the wound care center for a sebaceous cyst in the upper back.Saw wound care for LE edema. Patient has had this for many years and in fact when he was young man he had it lanced several times. It has come back and ruptured spontaneously several weeks ago. He denies fevers or chills and has no pain at this point  Past Medical History:  Diagnosis Date  . Abnormal EKG   . Edema of both legs   . History of palpitations   . Hypertension   . Personal history of gout     No past surgical history on file.  No family history on file.  Social History:  reports that he has never smoked. He has never used smokeless tobacco. He reports that he does not drink alcohol or use drugs.  Allergies:  Allergies  Allergen Reactions  . No Known Allergies     Medications reviewed.   Review of Systems:   Review of Systems  Constitutional: Negative.   HENT: Negative.   Eyes: Negative.   Respiratory: Negative.   Cardiovascular: Negative.   Gastrointestinal: Negative.   Genitourinary: Negative.   Musculoskeletal: Negative.   Skin: Negative.   Neurological: Negative.   Endo/Heme/Allergies: Negative.   Psychiatric/Behavioral: Negative.      Physical Exam:  There were no vitals taken for this visit.  Physical Exam  Constitutional: He is oriented to person, place, and time and well-developed, well-nourished, and in no distress. No distress.  HENT:  Head: Normocephalic and atraumatic.  Eyes: Pupils are equal, round, and reactive to light. Right eye exhibits no discharge. Left eye exhibits no discharge. No scleral icterus.  Neck: Normal range of motion.  Cardiovascular: Normal rate and regular rhythm.   Pulmonary/Chest: Effort normal. No respiratory distress.  Musculoskeletal: Normal range of motion. He exhibits edema. He exhibits no tenderness.   Bandages in both lower extremities  Lymphadenopathy:    He has no cervical adenopathy.  Neurological: He is alert and oriented to person, place, and time.  Skin: Skin is warm. He is not diaphoretic. There is erythema.  6 cm mass in the back in the mid line near the shoulders. There is purulence and sebaceous material visible as well as granulation tissue. No expanding erythema.  Vitals reviewed.     No results found for this or any previous visit (from the past 48 hour(s)). No results found.  Assessment/Plan:  Large sebaceous cyst of the mid back. Recommend incision and drainage today. There is no active infection but there is still some expressible purulence and sebaceous material. Removal of this at this time would benefit a decrease in size right or to excision at a later date. This was reviewed with the patient the risks of bleeding infection recurrence were all discussed with him he understood and agreed to proceed.  Informed consent was obtained a surgical timeout was performed local anesthetic was infiltrated into the skin and subcutaneous tissues around the rather large sebaceous cyst. Incision of the open area was performed to allow expression with pressure of the sebaceous material. No purulence was noted. The cavity was rather large and quarter-inch Nu Gauze packing was placed. A sterile dressing was placed.  Patient thought of this procedure well he will be seen tomorrow morning for dressing change he hasn't 9:30 appointment with the wound care center for his lower  extremities and we can see him should right before that.  Lattie Hawichard E Batul Diego, MD, FACS

## 2016-11-23 NOTE — Patient Instructions (Signed)
Epidermal Cyst An epidermal cyst is a small, painless lump under your skin. It may be called an epidermal inclusion cyst or an infundibular cyst. The cyst contains a grayish-white, bad-smelling substance (keratin). It is important not to pop epidermal cysts yourself. These cysts are usually harmless (benign), but they can get infected. Symptoms of infection may include:  Redness.  Inflammation.  Tenderness.  Warmth.  Fever.  A grayish-white, bad-smelling substance draining from the cyst.  Pus draining from the cyst.  Follow these instructions at home:  Take over-the-counter and prescription medicines only as told by your doctor.  If you were prescribed an antibiotic, use it as told by your doctor. Do not stop using the antibiotic even if you start to feel better.  Keep the area around your cyst clean and dry.  Wear loose, dry clothing.  Do not try to pop your cyst.  Avoid touching your cyst.  Check your cyst every day for signs of infection.  Keep all follow-up visits as told by your doctor. This is important. How is this prevented?  Wear clean, dry, clothing.  Avoid wearing tight clothing.  Keep your skin clean and dry. Shower or take baths every day.  Wash your body with a benzoyl peroxide wash when you shower or bathe. Contact a health care provider if:  Your cyst has symptoms of infection.  Your condition is not improving or is getting worse.  You have a cyst that looks different from other cysts you have had.  You have a fever. Get help right away if:  Redness spreads from the cyst into the surrounding area. This information is not intended to replace advice given to you by your health care provider. Make sure you discuss any questions you have with your health care provider. Document Released: 05/19/2004 Document Revised: 12/09/2015 Document Reviewed: 02/11/2015 Elsevier Interactive Patient Education  2018 [employer]  

## 2016-11-24 ENCOUNTER — Ambulatory Visit (INDEPENDENT_AMBULATORY_CARE_PROVIDER_SITE_OTHER): Payer: PPO | Admitting: Surgery

## 2016-11-24 ENCOUNTER — Encounter: Payer: PPO | Attending: Surgery | Admitting: Surgery

## 2016-11-24 ENCOUNTER — Encounter: Payer: Self-pay | Admitting: Surgery

## 2016-11-24 VITALS — BP 111/71 | HR 61 | Temp 97.7°F | Wt 235.0 lb

## 2016-11-24 DIAGNOSIS — M70862 Other soft tissue disorders related to use, overuse and pressure, left lower leg: Secondary | ICD-10-CM | POA: Diagnosis not present

## 2016-11-24 DIAGNOSIS — S81812A Laceration without foreign body, left lower leg, initial encounter: Secondary | ICD-10-CM | POA: Diagnosis not present

## 2016-11-24 DIAGNOSIS — W010XXA Fall on same level from slipping, tripping and stumbling without subsequent striking against object, initial encounter: Secondary | ICD-10-CM | POA: Insufficient documentation

## 2016-11-24 DIAGNOSIS — L723 Sebaceous cyst: Secondary | ICD-10-CM

## 2016-11-24 DIAGNOSIS — L089 Local infection of the skin and subcutaneous tissue, unspecified: Secondary | ICD-10-CM

## 2016-11-24 DIAGNOSIS — L97322 Non-pressure chronic ulcer of left ankle with fat layer exposed: Secondary | ICD-10-CM | POA: Insufficient documentation

## 2016-11-24 DIAGNOSIS — M109 Gout, unspecified: Secondary | ICD-10-CM | POA: Diagnosis not present

## 2016-11-24 DIAGNOSIS — L03116 Cellulitis of left lower limb: Secondary | ICD-10-CM | POA: Diagnosis not present

## 2016-11-24 DIAGNOSIS — S91002A Unspecified open wound, left ankle, initial encounter: Secondary | ICD-10-CM | POA: Diagnosis not present

## 2016-11-24 DIAGNOSIS — I890 Lymphedema, not elsewhere classified: Secondary | ICD-10-CM | POA: Diagnosis not present

## 2016-11-24 NOTE — Patient Instructions (Signed)
Please continue to apply a clean 4x4 gauze once a day and makes sure you keep it clean.

## 2016-11-24 NOTE — Progress Notes (Signed)
Outpatient postop visit  11/24/2016  [name] is an 76 y.o. male.    Procedure: Incision and drainage of back sebaceous cyst  CC: No problems  HPI: A she feels much better no fevers or chills  Medications reviewed.    Physical Exam:  BP 111/71   Pulse 61   Temp 97.7 F (36.5 C) (Oral)   Wt 235 lb (106.6 kg)   BMI 31.87 kg/m     PE: Wound is clean no erythema no drainage no purulence    Assessment/Plan:  Status post I&D of a sebaceous cyst of the back this is a long-standing problem for him. He will require excision at some point. He is on antibiotics but his culture showed minimal involvement. I will check cultures later. He will follow-up in 2 weeks and we will discuss excision at some point.  Lattie Hawichard E Shakena Callari, MD, FACS

## 2016-11-26 LAB — AEROBIC CULTURE  (SUPERFICIAL SPECIMEN): CULTURE: NORMAL

## 2016-11-27 NOTE — Progress Notes (Signed)
VASHON, RIORDAN ([id number]) Visit Report for 11/24/2016 Chief Complaint Document Details Patient Name: [name], [name] Date of Service: 11/24/2016 9:15 AM Medical Record Number: [id number] Patient Account Number: [id number] Date of Birth/Sex: [birth year] (76 y.o. Male) Treating RN: Clover Mealy, RN, BSN, Rita Primary Care Provider: Georgann Housekeeper Other Clinician: Referring Provider: Georgann Housekeeper Treating Provider/Extender: Rudene Re in Treatment: 10 Information Obtained from: Patient Chief Complaint Patient seen for complaints of Non-Healing Wound left lower lateral leg Electronic Signature(s) Signed: 11/24/2016 9:43:00 AM By: Evlyn Kanner MD, FACS Entered By: Evlyn Kanner on 11/24/2016 09:42:59 [place], [name] ([id number]) -------------------------------------------------------------------------------- HPI Details Patient Name: [name] Date of Service: 11/24/2016 9:15 AM Medical Record Number: [id number] Patient Account Number: [id number] Date of Birth/Sex: 01/23/1941 (76 y.o. Male) Treating RN: Clover Mealy, RN, BSN, Rita Primary Care Provider: Georgann Housekeeper Other Clinician: Referring Provider: Georgann Housekeeper Treating Provider/Extender: Rudene Re in Treatment: 10 History of Present Illness Location: left lower lateral leg Quality: Patient reports experiencing a sharp pain to affected area(s). Severity: Patient states wound are getting worse. Duration: Patient has had the wound for < 4 weeks prior to presenting for treatment Timing: Pain in wound is Intermittent (comes and goes Context: The wound occurred when the patient tripped and had a fall and injured his left leg Modifying Factors: Other treatment(s) tried include:had gone to an urgent care couple of times a day taken an x-ray and was put on antibiotics Associated Signs and Symptoms: Patient reports having increase swelling and redness to the leg HPI Description: 76 year old gentleman who had a injury to the left ankle and  foot 3 weeks ago was seen recently in the ER after he had swelling and redness of the left ankle. He is also known to have a flareup of gout recently. After he was examined in the urgent care he was placed on Keflex and Bactrim and referred to the wound center. A recent x-ray of the left ankle did not show any fractures or abnormality. The patient doesn't take very good care of his health has never been worked up for chronic lymphedema which she's had and sees a physician about once a year. He has not been aware that he has bilateral lower extremity lymphedema. he does not smoke. He has never had a workup for his lymphedema. 09/29/2016 -- the patient's insurance will not cover the snap vacuum system and he is finding it difficult to get home health as his copayment is $25 each time. He will not be able to afford the [employer] wound VAC either. 10/14/16 patient presents today for fault evaluation concerning his left lateral ankle wound. Unfortunately he has a skin tear which occurred on Monday of this week over the interior shin of his right lower extremity. Fortunately he did we approximate the flap as well as she could and I think this has done fairly well. Nonetheless he continues to have some discomfort at this site which is completely understandable. He is concerned about how well this is going to heal hopefully being that he did catch it early this will heal up quickly. He did question how long this would take I explained that I cannot give him an exact time but hopefully it will not be too long especially since the undermining seems to be filling in rapidly. 10/21/2016 -- besides the fall and the lacerated wound on his right lateral calf the rest of his health has been going okay and I have reviewed his wounds and made appropriate changes 10/28/16 on evaluation today patient appears  to be doing well in regard to the left lateral ankle wound. His right anterior shin wound still had Steri-Strips in  place so it appears to be doing well. Fortunately there's no evidence of infection. 11/03/16 on evaluation today patient's wound on the left lateral ankle appears to be doing better visually with epithelium migrating in from the non-o'clock location as well as the undermining improving significantly. [name], [name] ([id number]) feel like this is overall doing much better. The wound over the anterior shin of his right lower extremity appears to be closed there is no active drainage at this point no evidence of fluctuance underneath and I think this is also healing well. There is no evidence of infection at either site. Electronic Signature(s) Signed: 11/24/2016 9:43:07 AM By: Evlyn KannerBritto, Torra Pala MD, FACS Entered By: Evlyn KannerBritto, Bryanna Yim on 11/24/2016 09:43:07 [name], [name] ([id number]) -------------------------------------------------------------------------------- Physical Exam Details Patient Name: Trevor LackHUGHES, [name] Date of Service: 11/24/2016 9:15 AM Medical Record Number: [id number] Patient Account Number: [id number] Date of Birth/Sex: Sep 09, 1940 [age] y.o. Male) Treating RN: Clover MealyAfful, RN, BSN, Rita Primary Care Provider: Georgann HousekeeperHUSAIN, KARRAR Other Clinician: Referring Provider: Georgann HousekeeperHUSAIN, KARRAR Treating Provider/Extender: Rudene ReBritto, Eulla Kochanowski Weeks in Treatment: 10 Constitutional . Pulse regular. Respirations normal and unlabored. Afebrile. . Eyes Nonicteric. Reactive to light. Ears, Nose, Mouth, and Throat Lips, teeth, and gums WNL.Marland Kitchen. Moist mucosa without lesions. Neck supple and nontender. No palpable supraclavicular or cervical adenopathy. Normal sized without goiter. Respiratory WNL. No retractions.. Cardiovascular Pedal Pulses WNL. No clubbing, cyanosis or edema. Lymphatic No adneopathy. No adenopathy. No adenopathy. Musculoskeletal Adexa without tenderness or enlargement.. Digits and nails w/o clubbing, cyanosis, infection, petechiae, ischemia, or inflammatory conditions.. Integumentary (Hair, Skin) No suspicious  lesions. No crepitus or fluctuance. No peri-wound warmth or erythema. No masses.Marland Kitchen. Psychiatric Judgement and insight Intact.. No evidence of depression, anxiety, or agitation.. Notes wound is looking very good has healthy granulation tissue and there is no evidence of surrounding problems. The lymphedema is also well controlled with Kerlix and Theatre managerCoban Electronic Signature(s) Signed: 11/24/2016 9:43:38 AM By: Evlyn KannerBritto, Lindsee Labarre MD, FACS Entered By: Evlyn KannerBritto, Lativia Velie on 11/24/2016 09:43:37 [place], [name] -------------------------------------------------------------------------------- Physician Orders Details Patient Name: Trevor LackHUGHES, [name] Date of Service: 11/24/2016 9:15 AM Medical Record Number: [id number] Patient Account Number: [id number] Date of Birth/Sex: Sep 09, 1940 [age] y.o. Male) Treating RN: Clover MealyAfful, RN, BSN, Rita Primary Care Provider: Georgann HousekeeperHUSAIN, KARRAR Other Clinician: Referring Provider: Georgann HousekeeperHUSAIN, KARRAR Treating Provider/Extender: Rudene ReBritto, Annalese Stiner Weeks in Treatment: 10 Verbal / Phone Orders: No Diagnosis Coding Wound Cleansing Wound #1 Left,Lateral Lower Leg o Clean wound with Normal Saline. o Cleanse wound with mild soap and water Anesthetic Wound #1 Left,Lateral Lower Leg o Topical Lidocaine 4% cream applied to wound bed prior to debridement - for clinic use Skin Barriers/Peri-Wound Care Wound #1 Left,Lateral Lower Leg o Moisturizing lotion - not on wound Primary Wound Dressing Wound #1 Left,Lateral Lower Leg o Prisma Ag - moisten with saline Secondary Dressing Wound #1 Left,Lateral Lower Leg o Gauze and Kerlix/Conform Dressing Change Frequency Wound #1 Left,Lateral Lower Leg o Change dressing every other day. Follow-up Appointments Wound #1 Left,Lateral Lower Leg o Return Appointment in 1 week. Edema Control Wound #1 Left,Lateral Lower Leg o Patient to wear own compression stockings o Elevate legs to the level of the heart and pump ankles as often as  possible [place], [name] ([id number]) Additional Orders / Instructions Wound #1 Left,Lateral Lower Leg o Increase protein intake. Home Health Wound #1 Left,Lateral Lower Leg o D/C Home Health Services Electronic Signature(s) Signed: 11/24/2016 4:24:31 PM By: Evlyn KannerBritto, Shemia Bevel  MD, FACS Signed: 11/25/2016 6:39:18 PM By: Elpidio Eric BSN, RN Entered By: Elpidio Eric on 11/24/2016 11:53:07 [place], [name] ([id number]) -------------------------------------------------------------------------------- Problem List Details Patient Name: [name] Date of Service: 11/24/2016 9:15 AM Medical Record Number: [id number] Patient Account Number: [id number] Date of Birth/Sex: [birth year] (76 y.o. 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 Inactive Problems Resolved Problems ICD-10 Code Description Active Date Resolved Date L03.116 Cellulitis of left lower limb 09/15/2016 09/15/2016 S81.811A Laceration without foreign body, right lower leg, initial 10/21/2016 10/21/2016 encounter Electronic Signature(s) Signed: 11/24/2016 9:42:48 AM By: Evlyn Kanner MD, FACS Entered By: Evlyn Kanner on 11/24/2016 09:42:48 [place], [name] ([id number]) [place], [name] ([id number]) -------------------------------------------------------------------------------- Progress Note Details Patient Name: [name] Date of Service: 11/24/2016 9:15 AM Medical Record Number:  [id number] Patient Account Number: [id number] Date of Birth/Sex: 10/25/1940 (76 y.o. Male) Treating RN: Clover Mealy, RN, BSN, Rita Primary Care Provider: Georgann Housekeeper Other Clinician: Referring Provider: Georgann Housekeeper Treating Provider/Extender: Rudene Re in Treatment: 10 Subjective Chief Complaint Information obtained from Patient Patient seen for complaints of Non-Healing Wound left lower lateral leg History of Present Illness (HPI) The following HPI elements were documented for the patient's wound: Location: left lower lateral leg Quality: Patient reports experiencing a sharp pain to affected area(s). Severity: Patient states wound are getting worse. Duration: Patient has had the wound for < 4 weeks prior to presenting for treatment Timing: Pain in wound is Intermittent (comes and goes Context: The wound occurred when the patient tripped and had a fall and injured his left leg Modifying Factors: Other treatment(s) tried include:had gone to an urgent care couple of times a day taken an x-ray and was put on antibiotics Associated Signs and Symptoms: Patient reports having increase swelling and redness to the leg 76 year old gentleman who had a injury to the left ankle and foot 3 weeks ago was seen recently in the ER after he had swelling and redness of the left ankle. He is also known to have a flareup of gout recently. After he was examined in the urgent care he was placed on Keflex and Bactrim and referred to the wound center. A recent x-ray of the left ankle did not show any fractures or abnormality. The patient doesn't take very good care of his health has never been worked up for chronic lymphedema which she's had and sees a physician about once a year. He has not been aware that he has bilateral lower extremity lymphedema. he does not smoke. He has never had a workup for his lymphedema. 09/29/2016 -- the patient's insurance will not cover the snap vacuum system and he is  finding it difficult to get home health as his copayment is $25 each time. He will not be able to afford the [employer] wound VAC either. 10/14/16 patient presents today for fault evaluation concerning his left lateral ankle wound. Unfortunately he has a skin tear which occurred on Monday of this week over the interior shin of his right lower extremity. Fortunately he did we approximate the flap as well as she could and I think this  has done fairly well. Nonetheless he continues to have some discomfort at this site which is completely understandable. He is concerned about how well this is going to heal hopefully being that he did catch it early this will heal up quickly. He did question how long this would take I explained that I cannot give him an exact time but hopefully it will not be too long especially since the undermining seems to be filling in rapidly. 10/21/2016 -- besides the fall and the lacerated wound on his right lateral calf the rest of his health has [place], [name] ([id number]) been going okay and I have reviewed his wounds and made appropriate changes 10/28/16 on evaluation today patient appears to be doing well in regard to the left lateral ankle wound. His right anterior shin wound still had Steri-Strips in place so it appears to be doing well. Fortunately there's no evidence of infection. 11/03/16 on evaluation today patient's wound on the left lateral ankle appears to be doing better visually with epithelium migrating in from the non-o'clock location as well as the undermining improving significantly. I feel like this is overall doing much better. The wound over the anterior shin of his right lower extremity appears to be closed there is no active drainage at this point no evidence of fluctuance underneath and I think this is also healing well. There is no evidence of infection at either site. Objective Constitutional Pulse regular. Respirations normal and unlabored. Afebrile. Vitals  Time Taken: 9:20 AM, Height: 72 in, Weight: 236 lbs, BMI: 32, Temperature: 98.1 F, Pulse: 61 bpm, Respiratory Rate: 16 breaths/min, Blood Pressure: 96/48 mmHg. Eyes Nonicteric. Reactive to light. Ears, Nose, Mouth, and Throat Lips, teeth, and gums WNL.Marland Kitchen Moist mucosa without lesions. Neck supple and nontender. No palpable supraclavicular or cervical adenopathy. Normal sized without goiter. Respiratory WNL. No retractions.. Cardiovascular Pedal Pulses WNL. No clubbing, cyanosis or edema. Lymphatic No adneopathy. No adenopathy. No adenopathy. Musculoskeletal Adexa without tenderness or enlargement.. Digits and nails w/o clubbing, cyanosis, infection, petechiae, ischemia, or inflammatory conditions.Kizzie Bane, [name] ([id number]) Psychiatric Judgement and insight Intact.. No evidence of depression, anxiety, or agitation.. General Notes: wound is looking very good has healthy granulation tissue and there is no evidence of surrounding problems. The lymphedema is also well controlled with Kerlix and Coban Integumentary (Hair, Skin) No suspicious lesions. No crepitus or fluctuance. No peri-wound warmth or erythema. No masses.. Wound #1 status is Open. Original cause of wound was Trauma. The wound is located on the Left,Lateral Lower Leg. The wound measures 1.2cm length x 0.5cm width x 0.2cm depth; 0.471cm^2 area and 0.094cm^3 volume. There is Fat Layer (Subcutaneous Tissue) Exposed exposed. There is no tunneling or undermining noted. There is a medium amount of serosanguineous drainage noted. The wound margin is distinct with the outline attached to the wound base. There is medium (34-66%) pink granulation within the wound bed. There is a medium (34-66%) amount of necrotic tissue within the wound bed including Adherent Slough. The periwound skin appearance exhibited: Induration, Scarring, Erythema. The periwound skin appearance did not exhibit: Callus, Crepitus, Excoriation, Rash, Dry/Scaly,  Maceration, Atrophie Blanche, Cyanosis, Ecchymosis, Hemosiderin Staining, Mottled, Pallor, Rubor. The surrounding wound skin color is noted with erythema which is circumferential. Periwound temperature was noted as No Abnormality. Assessment Active Problems ICD-10 S81.812A - Laceration without foreign body, left lower leg, initial encounter M70.862 - Other soft tissue disorders related to use, overuse and pressure, left lower leg I89.0 - Lymphedema, not elsewhere classified L97.322 - Non-pressure chronic ulcer  of left ankle with fat layer exposed Plan Wound Cleansing: Wound #1 Left,Lateral Lower Leg: Clean wound with Normal Saline. Cleanse wound with mild soap and water Anesthetic: Wound #1 Left,Lateral Lower Leg: Topical Lidocaine 4% cream applied to wound bed prior to debridement - for clinic use Skin Barriers/Peri-Wound Care: Trevor LackHUGHES, [name] ([id number]) Wound #1 Left,Lateral Lower Leg: Moisturizing lotion - not on wound Primary Wound Dressing: Wound #1 Left,Lateral Lower Leg: Prisma Ag - moisten with saline Secondary Dressing: Wound #1 Left,Lateral Lower Leg: Gauze and Kerlix/Conform Dressing Change Frequency: Wound #1 Left,Lateral Lower Leg: Change dressing every other day. Follow-up Appointments: Wound #1 Left,Lateral Lower Leg: Return Appointment in 1 week. Edema Control: Wound #1 Left,Lateral Lower Leg: Patient to wear own compression stockings Elevate legs to the level of the heart and pump ankles as often as possible Additional Orders / Instructions: Wound #1 Left,Lateral Lower Leg: Increase protein intake. Home Health: Wound #1 Left,Lateral Lower Leg: D/C Home Health Services after review today I have recommended: 1. Elevation and exercise as he has got significant lymphedema 2. he has got his lymphedema compression stockings and I have discussed at length his need to wear them regularly and he has been instructed in how to do this 3. Prisma AG on the left  lower extremity wound. 4. Regular visits the wound center Electronic Signature(s) Signed: 11/24/2016 4:26:24 PM By: Evlyn KannerBritto, Shirly Bartosiewicz MD, FACS Previous Signature: 11/24/2016 9:44:22 AM Version By: Evlyn KannerBritto, Koa Palla MD, FACS Entered By: Evlyn KannerBritto, Fara Worthy on 11/24/2016 16:26:23 Trevor LackHUGHES, [name] ([id number]) -------------------------------------------------------------------------------- SuperBill Details Patient Name: Trevor LackHUGHES, [name] Date of Service: 11/24/2016 Medical Record Number: [id number] Patient Account Number: [id number] Date of Birth/Sex: 1941/04/02 13(75 y.o. Male) Treating RN: Clover MealyAfful, RN, BSN, Rita Primary Care Provider: Georgann HousekeeperHUSAIN, KARRAR Other Clinician: Referring Provider: Georgann HousekeeperHUSAIN, KARRAR Treating Provider/Extender: Rudene ReBritto, Drusilla Wampole Weeks in Treatment: 10 Diagnosis Coding ICD-10 Codes Code Description [phone] Laceration without foreign body, left lower leg, initial encounter [phone] Other soft tissue disorders related to use, overuse and pressure, left lower leg I89.0 Lymphedema, not elsewhere classified L97.322 Non-pressure chronic ulcer of left ankle with fat layer exposed Facility Procedures CPT4 Code: [id number] Description: [id number] - WOUND CARE VISIT-LEV 2 EST PT Modifier: Quantity: 1 Physician Procedures CPT4 Code Description: [id number] - WC PHYS LEVEL 3 - EST PT ICD-10 Description Diagnosis S81.812A Laceration without foreign body, left lower leg, ini L97.322 Non-pressure chronic ulcer of left ankle with fat la I89.0 Lymphedema, not elsewhere  classified Modifier: tial encounter yer exposed Quantity: 1 Electronic Signature(s) Signed: 11/24/2016 9:50:46 AM By: Evlyn KannerBritto, Sherrika Weakland MD, FACS Entered By: Evlyn KannerBritto, Jazmyn Offner on 11/24/2016 09:50:45

## 2016-11-27 NOTE — Progress Notes (Signed)
[name], [name] ([id number]) Visit Report for 11/24/2016 Arrival Information Details Patient Name: [name], [name] Date of Service: 11/24/2016 9:15 AM Medical Record Number: [id number] Patient Account Number: [id number] Date of Birth/Sex: [birth year] (76 y.o. Male) Treating RN: Trevor Mealy, RN, BSN, [name] Primary Care [name]: [name] Other Clinician: Referring [name]: [name] Treating [name]/Extender: [name] in Treatment: 10 Visit Information History Since Last Visit All ordered tests and consults were completed: No Patient Arrived: Ambulatory Added or deleted any medications: No Arrival Time: 09:17 Any new allergies or adverse reactions: No Accompanied By: self Had a fall or experienced change in No Transfer Assistance: None activities of daily living that may affect Patient Identification Verified: Yes risk of falls: Secondary Verification Process Yes Signs or symptoms of abuse/neglect since last No Completed: visito Patient Requires Transmission- No Hospitalized since last visit: No Based Precautions: Has Dressing in Place as Prescribed: Yes Patient Has Alerts: Yes Has Compression in Place as Prescribed: Yes Patient Alerts: L ABI non- Pain Present Now: No compressible R ABI non- compressible Electronic Signature(s) Signed: 11/25/2016 6:39:18 PM By: [name] BSN, RN Entered By: [name] on 11/24/2016 09:20:33 [name], [name] ([id number]) -------------------------------------------------------------------------------- Clinic Level of Care Assessment Details Patient Name: [name] Date of Service: 11/24/2016 9:15 AM Medical Record Number: [id number] Patient Account Number: [id number] Date of Birth/Sex: 06-19-1940 (76 y.o. Male) Treating RN: Trevor Mealy, RN, BSN, [name] Primary Care Keyarra Rendall: [name] Other Clinician: Referring [name]: [name] Treating [name]/Extender: [name] in Treatment: 10 Clinic Level of Care Assessment  Items TOOL 4 Quantity Score []  - Use when only an EandM is performed on FOLLOW-UP visit 0 ASSESSMENTS - Nursing Assessment / Reassessment X - Reassessment of Co-morbidities (includes updates in patient status) 1 10 X - Reassessment of Adherence to Treatment Plan 1 5 ASSESSMENTS - Wound and Skin Assessment / Reassessment X - Simple Wound Assessment / Reassessment - one wound 1 5 []  - Complex Wound Assessment / Reassessment - multiple wounds 0 []  - Dermatologic / Skin Assessment (not related to wound area) 0 ASSESSMENTS - Focused Assessment []  - Circumferential Edema Measurements - multi extremities 0 []  - Nutritional Assessment / Counseling / Intervention 0 X - Lower Extremity Assessment (monofilament, tuning fork, pulses) 1 5 []  - Peripheral Arterial Disease Assessment (using hand held doppler) 0 ASSESSMENTS - Ostomy and/or Continence Assessment and Care []  - Incontinence Assessment and Management 0 []  - Ostomy Care Assessment and Management (repouching, etc.) 0 PROCESS - Coordination of Care X - Simple Patient / Family Education for ongoing care 1 15 []  - Complex (extensive) Patient / Family Education for ongoing care 0 []  - Staff obtains Chiropractor, Records, Test Results / Process Orders 0 []  - Staff telephones HHA, Nursing Homes / Clarify orders / etc 0 []  - Routine Transfer to another Facility (non-emergent condition) 0 [name], [name] ([id number]) []  - Routine Hospital Admission (non-emergent condition) 0 []  - New Admissions / Manufacturing engineer / Ordering NPWT, Apligraf, etc. 0 []  - Emergency Hospital Admission (emergent condition) 0 []  - Simple Discharge Coordination 0 []  - Complex (extensive) Discharge Coordination 0 PROCESS - Special Needs []  - Pediatric / Minor Patient Management 0 []  - Isolation Patient Management 0 []  - Hearing / Language / Visual special needs 0 []  - Assessment of Community assistance (transportation, D/C planning, etc.) 0 []  - Additional assistance /  Altered mentation 0 []  - Support Surface(s) Assessment (bed, cushion, seat, etc.) 0 INTERVENTIONS - Wound Cleansing / Measurement X - Simple Wound Cleansing - one  wound 1 5 []  - Complex Wound Cleansing - multiple wounds 0 X - Wound Imaging (photographs - any number of wounds) 1 5 []  - Wound Tracing (instead of photographs) 0 X - Simple Wound Measurement - one wound 1 5 []  - Complex Wound Measurement - multiple wounds 0 INTERVENTIONS - Wound Dressings X - Small Wound Dressing one or multiple wounds 1 10 []  - Medium Wound Dressing one or multiple wounds 0 []  - Large Wound Dressing one or multiple wounds 0 []  - Application of Medications - topical 0 []  - Application of Medications - injection 0 INTERVENTIONS - Miscellaneous []  - External ear exam 0 [name], [name] ([id number]) []  - Specimen Collection (cultures, biopsies, blood, body fluids, etc.) 0 []  - Specimen(s) / Culture(s) sent or taken to Lab for analysis 0 []  - Patient Transfer (multiple staff / Michiel SitesHoyer Lift / Similar devices) 0 []  - Simple Staple / Suture removal (25 or less) 0 []  - Complex Staple / Suture removal (26 or more) 0 []  - Hypo / Hyperglycemic Management (close monitor of Blood Glucose) 0 []  - Ankle / Brachial Index (ABI) - do not check if billed separately 0 X - Vital Signs 1 5 Has the patient been seen at the hospital within the last three years: Yes Total Score: 70 Level Of Care: New/Established - Level 2 Electronic Signature(s) Signed: 11/25/2016 6:39:18 PM By: [name], [name] BSN, RN Entered By: [name], [name] on 11/24/2016 09:42:42 [name], [name] ([id number]) -------------------------------------------------------------------------------- Encounter Discharge Information Details Patient Name: Trevor LackHUGHES, [name] Date of Service: 11/24/2016 9:15 AM Medical Record Number: [id number] Patient Account Number: [id number] Date of Birth/Sex: [birth year] [age] y.o. Male) Treating RN: Trevor MealyAfful, RN, BSN, [name] Primary Care Belinda Bringhurst: Trevor HousekeeperHUSAIN,  KARRAR Other Clinician: Referring Ashdon Gillson: Trevor HousekeeperHUSAIN, KARRAR Treating Rachael Ferrie/Extender: Trevor ReBritto, Errol Weeks in Treatment: 10 Encounter Discharge Information Items Discharge Pain Level: 0 Discharge Condition: Stable Ambulatory Status: Ambulatory Discharge Destination: Home Transportation: Private Auto Schedule Follow-up Appointment: No Medication Reconciliation completed and provided to Patient/Care No Roxi Hlavaty: Provided on Clinical Summary of Care: 11/24/2016 Form Type [facility]ip[facility] Paper [facility] Signature(s) Signed: 11/25/2016 6:39:18 PM By: [name], [name] BSN, RN Previous Signature: 11/24/2016 9:42:46 AM Version By: Gwenlyn PerkingMoore, Shelia Entered By: [name], [name] on 11/24/2016 09:43:35 [place]GH[place], [name] ([id number]) -------------------------------------------------------------------------------- Lower Extremity Assessment Details Patient Name: Trevor LackHUGHES, [name] Date of Service: 11/24/2016 9:15 AM Medical Record Number: [id number] Patient Account Number: [id number] Date of Birth/Sex: [birth year] [age] y.o. Male) Treating RN: Trevor MealyAfful, RN, BSN, [name] Primary Care Harriet Bollen: Trevor HousekeeperHUSAIN, KARRAR Other Clinician: Referring Jt Brabec: Trevor HousekeeperHUSAIN, KARRAR Treating Kaimana Lurz/Extender: Trevor ReBritto, Errol Weeks in Treatment: 10 Edema Assessment Assessed: [Left: No] [Right: No] E[Left: dema] [Right: :] Calf Left: Right: Point of Measurement: 35 cm From Medial Instep 35.2 cm cm Ankle Left: Right: Point of Measurement: 13 cm From Medial Instep 25.3 cm cm Vascular Assessment Claudication: Claudication Assessment [Left:None] Pulses: Dorsalis Pedis Palpable: [Left:Yes] Posterior Tibial Extremity colors, hair growth, and conditions: Extremity Color: [Left:Normal] Hair Growth on Extremity: [Left:Yes] Temperature of Extremity: [Left:Warm] Capillary Refill: [Left:< 3 seconds] Toe Nail Assessment Left: Right: Thick: Yes Discolored: Yes Deformed: No Electronic Signature(s) Signed: 11/25/2016 6:39:18 PM By: [name], [name] BSN,  RN Entered By: [name], [name] on 11/24/2016 09:37:16 [place] BeachHUGHES, [name] ([id number]) [place], [name] ([id number]) -------------------------------------------------------------------------------- Multi Wound Chart Details Patient Name: Trevor LackHUGHES, [name] Date of Service: 11/24/2016 9:15 AM Medical Record Number: [id number] Patient Account Number: [id number] Date of Birth/Sex: [birth year] [age] y.o. Male) Treating RN: Trevor MealyAfful, RN, BSN, [name] Primary Care Yoshie Kosel: Trevor HousekeeperHUSAIN, KARRAR Other Clinician: Referring Alexianna Nachreiner: Trevor HousekeeperHUSAIN, KARRAR Treating Brentley Landfair/Extender: Evlyn KannerBritto, Errol  Weeks in Treatment: 10 Vital Signs Height(in): 72 Pulse(bpm): 61 Weight(lbs): 236 Blood Pressure 96/48 (mmHg): Body Mass Index(BMI): 32 Temperature(F): 98.1 Respiratory Rate 16 (breaths/min): Photos: [1:No Photos] [N/A:N/A] Wound Location: [1:Left Lower Leg - Lateral] [N/A:N/A] Wounding Event: [1:Trauma] [N/A:N/A] Primary Etiology: [1:Trauma, Other] [N/A:N/A] Comorbid History: [1:Cataracts, Hypertension, Gout] [N/A:N/A] Date Acquired: [1:08/25/2016] [N/A:N/A] Weeks of Treatment: [1:10] [N/A:N/A] Wound Status: [1:Open] [N/A:N/A] Measurements L x W x D 1.2x0.5x0.2 [N/A:N/A] (cm) Area (cm) : [1:0.471] [N/A:N/A] Volume (cm) : [1:0.094] [N/A:N/A] % Reduction in Area: [1:85.90%] [N/A:N/A] % Reduction in Volume: 71.90% [N/A:N/A] Classification: [1:Full Thickness Without Exposed Support Structures] [N/A:N/A] Exudate Amount: [1:Medium] [N/A:N/A] Exudate Type: [1:Serosanguineous] [N/A:N/A] Exudate Color: [1:red, brown] [N/A:N/A] Wound Margin: [1:Distinct, outline attached] [N/A:N/A] Granulation Amount: [1:Medium (34-66%)] [N/A:N/A] Granulation Quality: [1:Pink] [N/A:N/A] Necrotic Amount: [1:Medium (34-66%)] [N/A:N/A] Exposed Structures: [1:Fat Layer (Subcutaneous Tissue) Exposed: Yes Fascia: No Tendon: No Muscle: No] [N/A:N/A] Joint: No Bone: No Epithelialization: Small (1-33%) N/A N/A Periwound Skin Texture: Induration: Yes N/A  N/A Scarring: Yes Excoriation: No Callus: No Crepitus: No Rash: No Periwound Skin Maceration: No N/A N/A Moisture: Dry/Scaly: No Periwound Skin Color: Erythema: Yes N/A N/A Atrophie Blanche: No Cyanosis: No Ecchymosis: No Hemosiderin Staining: No Mottled: No Pallor: No Rubor: No Erythema Location: Circumferential N/A N/A Temperature: No Abnormality N/A N/A Tenderness on No N/A N/A Palpation: Wound Preparation: Ulcer Cleansing: N/A N/A Rinsed/Irrigated with Saline, Other: soap and water Topical Anesthetic Applied: None Treatment Notes Electronic Signature(s) Signed: 11/24/2016 9:42:53 AM By: Evlyn Kanner MD, FACS Entered By: Evlyn Kanner on 11/24/2016 09:42:53 Abie, [name] ([id number]) -------------------------------------------------------------------------------- Multi-Disciplinary Care Plan Details Patient Name: [name] Date of Service: 11/24/2016 9:15 AM Medical Record Number: [id number] Patient Account Number: [id number] Date of Birth/Sex: Sep 01, 1940 (76 y.o. Male) Treating RN: Trevor Mealy, RN, BSN, [name] Primary Care Shaunn Tackitt: [name] Other Clinician: Referring Annmarie Plemmons: [name] Treating Santez Woodcox/Extender: [name] in Treatment: 10 Active Inactive ` Abuse / Safety / Falls / Self Care Management Nursing Diagnoses: History of Falls Potential for falls Goals: Patient will remain injury free related to falls Date Initiated: 09/15/2016 Target Resolution Date: 11/26/2016 Goal Status: Active Interventions: Assess fall risk on admission and as needed Assess impairment of mobility on admission and as needed per policy Notes: ` Nutrition Nursing Diagnoses: Imbalanced nutrition Potential for alteratiion in Nutrition/Potential for imbalanced nutrition Goals: Patient/caregiver agrees to and verbalizes understanding of need to use nutritional supplements and/or vitamins as prescribed Date Initiated: 09/15/2016 Target Resolution Date:  12/31/2016 Goal Status: Active Interventions: Assess patient nutrition upon admission and as needed per policy Notes: ` Orientation to the Wound Care Program [place], [place] ([id number]) Nursing Diagnoses: Knowledge deficit related to the wound healing center program Goals: Patient/caregiver will verbalize understanding of the Wound Healing Center Program Date Initiated: 09/15/2016 Target Resolution Date: 10/01/2016 Goal Status: Active Interventions: Provide education on orientation to the wound center Notes: ` Pain, Acute or Chronic Nursing Diagnoses: Pain, acute or chronic: actual or potential Potential alteration in comfort, pain Goals: Patient/caregiver will verbalize adequate pain control between visits Date Initiated: 09/15/2016 Target Resolution Date: 12/31/2016 Goal Status: Active Interventions: Complete pain assessment as per visit requirements Notes: ` Wound/Skin Impairment Nursing Diagnoses: Impaired tissue integrity Knowledge deficit related to ulceration/compromised skin integrity Goals: Ulcer/skin breakdown will have a volume reduction of 80% by week 12 Date Initiated: 09/15/2016 Target Resolution Date: 12/24/2016 Goal Status: Active Interventions: Assess patient/caregiver ability to perform ulcer/skin care regimen upon admission and as needed Notes: [name], [name] ([id number]) Electronic Signature(s) Signed: 11/25/2016 6:39:18 PM By: [name],  [place] Sinkita BSN, RN Entered By: [name], [name] on 11/24/2016 09:37:21 DonnaHUGHES, Kathlene NovemberMIKE ([id number]) -------------------------------------------------------------------------------- Pain Assessment Details Patient Name: Trevor LackHUGHES, [name] Date of Service: 11/24/2016 9:15 AM Medical Record Number: [id number] Patient Account Number: [id number] Date of Birth/Sex: Sep 10, 1940 [age] y.o. Male) Treating RN: Trevor MealyAfful, RN, BSN, [name] Primary Care Aamna Mallozzi: Trevor HousekeeperHUSAIN, KARRAR Other Clinician: Referring Quinetta Shilling: Trevor HousekeeperHUSAIN, KARRAR Treating Faye Strohman/Extender: Trevor ReBritto,  Errol Weeks in Treatment: 10 Active Problems Location of Pain Severity and Description of Pain Patient Has Paino No Site Locations With Dressing Change: No Pain Management and Medication Current Pain Management: Electronic Signature(s) Signed: 11/25/2016 6:39:18 PM By: [name], [name] BSN, RN Entered By: [name], [name] on 11/24/2016 09:20:40 [place]HUGHES, [name] ([id number]) -------------------------------------------------------------------------------- Patient/Caregiver Education Details Patient Name: Trevor LackHUGHES, [name] Date of Service: 11/24/2016 9:15 AM Medical Record Number: [id number] Patient Account Number: [id number] Date of Birth/Gender: Sep 10, 1940 [age] y.o. Male) Treating RN: Trevor MealyAfful, RN, BSN, [name] Primary Care Physician: Trevor HousekeeperHUSAIN, KARRAR Other Clinician: Referring Physician: Georgann HousekeeperHUSAIN, KARRAR Treating Physician/Extender: Trevor ReBritto, Errol Weeks in Treatment: 10 Education Assessment Education Provided To: Patient Education Topics Provided Welcome To The Wound Care Center: Methods: Explain/Verbal Responses: State content correctly Wound/Skin Impairment: Methods: Explain/Verbal Responses: State content correctly Electronic Signature(s) Signed: 11/25/2016 6:39:18 PM By: [name], [name] BSN, RN Entered By: [name], [name] on 11/24/2016 09:43:49 [place], [name] ([id number]) -------------------------------------------------------------------------------- Wound Assessment Details Patient Name: Trevor LackHUGHES, [name] Date of Service: 11/24/2016 9:15 AM Medical Record Number: [id number] Patient Account Number: [id number] Date of Birth/Sex: Sep 10, 1940 [age] y.o. Male) Treating RN: Trevor MealyAfful, RN, BSN, [name] Primary Care Ural Acree: Trevor HousekeeperHUSAIN, KARRAR Other Clinician: Referring Nury Nebergall: Trevor HousekeeperHUSAIN, KARRAR Treating Ryenn Howeth/Extender: Trevor ReBritto, Errol Weeks in Treatment: 10 Wound Status Wound Number: 1 Primary Etiology: Trauma, Other Wound Location: Left Lower Leg - Lateral Wound Status: Open Wounding Event: Trauma Comorbid History: Cataracts,  Hypertension, Gout Date Acquired: 08/25/2016 Weeks Of Treatment: 10 Clustered Wound: No Photos Photo Uploaded By: [name], [name] on 11/24/2016 11:51:51 Wound Measurements Length: (cm) 1.2 Width: (cm) 0.5 Depth: (cm) 0.2 Area: (cm) 0.471 Volume: (cm) 0.094 % Reduction in Area: 85.9% % Reduction in Volume: 71.9% Epithelialization: Small (1-33%) Tunneling: No Undermining: No Wound Description Full Thickness Without Exposed Classification: Support Structures Wound Margin: Distinct, outline attached Exudate Medium Amount: Gebert, [name] ([id number]) Foul Odor After Cleansing: No Slough/Fibrino No Exudate Type: Serosanguineous Exudate Color: red, brown Wound Bed Granulation Amount: Medium (34-66%) Exposed Structure Granulation Quality: Pink Fascia Exposed: No Necrotic Amount: Medium (34-66%) Fat Layer (Subcutaneous Tissue) Exposed: Yes Necrotic Quality: Adherent Slough Tendon Exposed: No Muscle Exposed: No Joint Exposed: No Bone Exposed: No Periwound Skin Texture Texture Color No Abnormalities Noted: No No Abnormalities Noted: No Callus: No Atrophie Blanche: No Crepitus: No Cyanosis: No Excoriation: No Ecchymosis: No Induration: Yes Erythema: Yes Rash: No Erythema Location: Circumferential Scarring: Yes Hemosiderin Staining: No Mottled: No Moisture Pallor: No No Abnormalities Noted: No Rubor: No Dry / Scaly: No Maceration: No Temperature / Pain Temperature: No Abnormality Wound Preparation Ulcer Cleansing: Rinsed/Irrigated with Saline, Other: soap and water, Topical Anesthetic Applied: None Treatment Notes Wound #1 (Left, Lateral Lower Leg) 1. Cleansed with: Cleanse wound with antibacterial soap and water 3. Peri-wound Care: Moisturizing lotion 4. Dressing Applied: Prisma Ag 5. Secondary Dressing Applied Gauze and Kerlix/Conform 7. Secured with Secretary/administratorTape Electronic Signature(s) Signed: 11/25/2016 6:39:18 PM By: [name], [name] BSN, RN HaydenHUGHES, [name]  ([id number]) Entered By: [name], [name] on 11/24/2016 09:28:33 Trevor LackHUGHES, [name] ([id number]) -------------------------------------------------------------------------------- Vitals Details Patient Name: Trevor LackHUGHES, [name] Date of Service: 11/24/2016 9:15 AM Medical Record Number: [id number] Patient Account Number: [id number] Date of Birth/Sex: Sep 10, 1940 [age] y.o. Male) Treating  RN: Trevor Mealy, RN, BSN, [name] Primary Care Lumina Gitto: [name] Other Clinician: Referring Mikenna Bunkley: [name] Treating Clorene Nerio/Extender: [name] in Treatment: 10 Vital Signs Time Taken: 09:20 Temperature (F): 98.1 Height (in): 72 Pulse (bpm): 61 Weight (lbs): 236 Respiratory Rate (breaths/min): 16 Body Mass Index (BMI): 32 Blood Pressure (mmHg): 96/48 Reference Range: 80 - 120 mg / dl Electronic Signature(s) Signed: 11/25/2016 6:39:18 PM By: [name] BSN, RN Entered By: [name] on 11/24/2016 09:21:01

## 2016-11-28 ENCOUNTER — Telehealth: Payer: Self-pay

## 2016-11-28 NOTE — Telephone Encounter (Signed)
Called patient to let him know that his culture was normal per Dr. Tonita CongWoodham. I also told him that he should finish taking his antibiotic. I also reminded him of his follow up appointment and he stated that he remembered.

## 2016-11-29 DIAGNOSIS — I712 Thoracic aortic aneurysm, without rupture: Secondary | ICD-10-CM | POA: Insufficient documentation

## 2016-11-29 DIAGNOSIS — I7121 Aneurysm of the ascending aorta, without rupture: Secondary | ICD-10-CM | POA: Insufficient documentation

## 2016-11-29 DIAGNOSIS — R001 Bradycardia, unspecified: Secondary | ICD-10-CM | POA: Diagnosis not present

## 2016-11-29 DIAGNOSIS — I10 Essential (primary) hypertension: Secondary | ICD-10-CM | POA: Diagnosis not present

## 2016-11-29 DIAGNOSIS — I340 Nonrheumatic mitral (valve) insufficiency: Secondary | ICD-10-CM | POA: Insufficient documentation

## 2016-11-29 DIAGNOSIS — R600 Localized edema: Secondary | ICD-10-CM | POA: Diagnosis not present

## 2016-11-29 DIAGNOSIS — R0609 Other forms of dyspnea: Secondary | ICD-10-CM | POA: Diagnosis not present

## 2016-12-01 ENCOUNTER — Encounter: Payer: PPO | Admitting: Physician Assistant

## 2016-12-01 DIAGNOSIS — S81812A Laceration without foreign body, left lower leg, initial encounter: Secondary | ICD-10-CM | POA: Diagnosis not present

## 2016-12-03 NOTE — Progress Notes (Signed)
RENTON, BERKLEY ([id number]) Visit Report for 12/01/2016 Chief Complaint Document Details Patient Name: [name], [name] Date of Service: 12/01/2016 12:30 PM Medical Record Number: [id number] Patient Account Number: [id number] Date of Birth/Sex: 18-Jan-1941 (76 y.o. Male) Treating RN: Phillis Haggis Primary Care Provider: Georgann Housekeeper Other Clinician: Referring Provider: Georgann Housekeeper Treating Provider/Extender: Linwood Dibbles, HOYT Weeks in Treatment: 11 Information Obtained from: Patient Chief Complaint Patient seen for complaints of Non-Healing Wound left lower lateral leg Electronic Signature(s) Signed: 12/02/2016 10:18:42 AM By: Lenda Kelp PA-C Entered By: Lenda Kelp on 12/01/2016 13:28:56 [place], [name] ([id number]) -------------------------------------------------------------------------------- HPI Details Patient Name: [name] Date of Service: 12/01/2016 12:30 PM Medical Record Number: [id number] Patient Account Number: [id number] Date of Birth/Sex: 1940-10-22 (76 y.o. Male) Treating RN: Phillis Haggis Primary Care Provider: Georgann Housekeeper Other Clinician: Referring Provider: Georgann Housekeeper Treating Provider/Extender: Linwood Dibbles, HOYT Weeks in Treatment: 11 History of Present Illness Location: left lower lateral leg Quality: Patient reports experiencing a sharp pain to affected area(s). Severity: Patient states wound are getting worse. Duration: Patient has had the wound for < 4 weeks prior to presenting for treatment Timing: Pain in wound is Intermittent (comes and goes Context: The wound occurred when the patient tripped and had a fall and injured his left leg Modifying Factors: Other treatment(s) tried include:had gone to an urgent care couple of times a day taken an x-ray and was put on antibiotics Associated Signs and Symptoms: Patient reports having increase swelling and redness to the leg HPI Description: 76 year old gentleman who had a injury to the left ankle and  foot 3 weeks ago was seen recently in the ER after he had swelling and redness of the left ankle. He is also known to have a flareup of gout recently. After he was examined in the urgent care he was placed on Keflex and Bactrim and referred to the wound center. A recent x-ray of the left ankle did not show any fractures or abnormality. The patient doesn't take very good care of his health has never been worked up for chronic lymphedema which she's had and sees a physician about once a year. He has not been aware that he has bilateral lower extremity lymphedema. he does not smoke. He has never had a workup for his lymphedema. 09/29/2016 -- the patient's insurance will not cover the snap vacuum system and he is finding it difficult to get home health as his copayment is $25 each time. He will not be able to afford the [employer] wound VAC either. 10/14/16 patient presents today for fault evaluation concerning his left lateral ankle wound. Unfortunately he has a skin tear which occurred on Monday of this week over the interior shin of his right lower extremity. Fortunately he did we approximate the flap as well as she could and I think this has done fairly well. Nonetheless he continues to have some discomfort at this site which is completely understandable. He is concerned about how well this is going to heal hopefully being that he did catch it early this will heal up quickly. He did question how long this would take I explained that I cannot give him an exact time but hopefully it will not be too long especially since the undermining seems to be filling in rapidly. 10/21/2016 -- besides the fall and the lacerated wound on his right lateral calf the rest of his health has been going okay and I have reviewed his wounds and made appropriate changes 10/28/16 on evaluation today patient appears to  be doing well in regard to the left lateral ankle wound. His right anterior shin wound still had Steri-Strips in  place so it appears to be doing well. Fortunately there's no evidence of infection. 11/03/16 on evaluation today patient's wound on the left lateral ankle appears to be doing better visually with epithelium migrating in from the non-o'clock location as well as the undermining improving significantly. JOESPH MARCY, [name] ([id number]) feel like this is overall doing much better. The wound over the anterior shin of his right lower extremity appears to be closed there is no active drainage at this point no evidence of fluctuance underneath and I think this is also healing well. There is no evidence of infection at either site. 12/01/16 on evaluation today patient appears to be doing well in regard to his Current wound care measures. With that being said the only issue is that the collagen seems to be sticking to the wound fairly significantly causing some slight tearing to the new skin. I will say this is not something that we want. Therefore we may need to consider switching to a different dressing. No fevers, chills, nausea, or vomiting noted at this time. Electronic Signature(s) Signed: 12/02/2016 10:18:42 AM By: Lenda Kelp PA-C Entered By: Lenda Kelp on 12/01/2016 13:29:08 AHMAAD, NEIDHARDT ([id number]) -------------------------------------------------------------------------------- Physical Exam Details Patient Name: [name] Date of Service: 12/01/2016 12:30 PM Medical Record Number: [id number] Patient Account Number: [id number] Date of Birth/Sex: [birth year] (76 y.o. Male) Treating RN: Phillis Haggis Primary Care Provider: Georgann Housekeeper Other Clinician: Referring Provider: Georgann Housekeeper Treating Provider/Extender: Linwood Dibbles, HOYT Weeks in Treatment: 11 Constitutional Well-nourished and well-hydrated in no acute distress. Respiratory normal breathing without difficulty. clear to auscultation bilaterally. Cardiovascular regular rate and rhythm with normal S1, S2. Psychiatric this  patient is able to make decisions and demonstrates good insight into disease process. Alert and Oriented x 3. pleasant and cooperative. Notes Patient's wound appears to have an excellent granular bed without any slough noted. There is no erythema surrounding the wound although the current collagen dressing is getting very stuck to the wound bed and surrounding healing tissue. Electronic Signature(s) Signed: 12/02/2016 10:18:42 AM By: Lenda Kelp PA-C Entered By: Lenda Kelp on 12/01/2016 13:29:44 [place], Kathlene November ([id number]) -------------------------------------------------------------------------------- Physician Orders Details Patient Name: [name] Date of Service: 12/01/2016 12:30 PM Medical Record Number: [id number] Patient Account Number: [id number] Date of Birth/Sex: 1941/03/29 (76 y.o. Male) Treating RN: Phillis Haggis Primary Care Provider: Georgann Housekeeper Other Clinician: Referring Provider: Georgann Housekeeper Treating Provider/Extender: Linwood Dibbles, HOYT Weeks in Treatment: 11 Verbal / Phone Orders: Yes ClinicianAshok Cordia, Debi Read Back and Verified: Yes Diagnosis Coding Wound Cleansing Wound #1 Left,Lateral Lower Leg o Clean wound with Normal Saline. o Cleanse wound with mild soap and water Anesthetic Wound #1 Left,Lateral Lower Leg o Topical Lidocaine 4% cream applied to wound bed prior to debridement - for clinic use Skin Barriers/Peri-Wound Care Wound #1 Left,Lateral Lower Leg o Moisturizing lotion - not on wound Primary Wound Dressing Wound #1 Left,Lateral Lower Leg o Hydrogel o Other: - sorbact Secondary Dressing Wound #1 Left,Lateral Lower Leg o Dry Gauze o Boardered Foam Dressing Dressing Change Frequency Wound #1 Left,Lateral Lower Leg o Change dressing every other day. Follow-up Appointments Wound #1 Left,Lateral Lower Leg o Return Appointment in 1 week. Edema Control Wound #1 Left,Lateral Lower Leg o Patient to wear own  compression stockings o Elevate legs to the level of the heart and pump ankles as often as possible [place], [name] (  [id number]) Additional Orders / Instructions Wound #1 Left,Lateral Lower Leg o Increase protein intake. Notes I'm going to recommend that we continue with the Current wound care measures for the next week. The only switch I'm going to make [place] discontinue the collagen and we will instead initiate a Sorback Dressing which hopefully will not stick to the wound. We will see him for reevaluation in one week to see were things stand at that point. If anything worsens in the interim patient will contact the office for additional recommendations. Electronic Signature(s) Signed: 12/02/2016 10:18:42 AM By: Lenda Kelp PA-C Entered By: Lenda Kelp on 12/01/2016 13:31:36 [place], [name] ([id number]) -------------------------------------------------------------------------------- Problem List Details Patient Name: [name] Date of Service: 12/01/2016 12:30 PM Medical Record Number: [id number] Patient Account Number: [id number] Date of Birth/Sex: 1940-10-20 (76 y.o. 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 Inactive Problems Resolved Problems ICD-10 Code Description Active Date Resolved Date L03.116 Cellulitis of left lower limb 09/15/2016 09/15/2016 S81.811A Laceration without foreign body, right lower leg, initial  10/21/2016 10/21/2016 encounter Electronic Signature(s) Signed: 12/01/2016 4:59:12 PM By: Alejandro Mulling Signed: 12/02/2016 10:18:42 AM By: Lenda Kelp PA-C Entered By: Alejandro Mulling on 12/01/2016 16:28:40 [place], [name] ([id number]) [place], [name] ([id number]) -------------------------------------------------------------------------------- Progress Note Details Patient Name: [name] Date of Service: 12/01/2016 12:30 PM Medical Record Number: [id number] Patient Account Number: [id number] Date of Birth/Sex: 1941/01/23 (76 y.o. Male) Treating RN: Phillis Haggis Primary Care Provider: Georgann Housekeeper Other Clinician: Referring Provider: Georgann Housekeeper Treating Provider/Extender: Linwood Dibbles, HOYT Weeks in Treatment: 11 Subjective Chief Complaint Information obtained from Patient Patient seen for complaints of Non-Healing Wound left lower lateral leg History of Present Illness (HPI) The following HPI elements were documented for the patient's wound: Location: left lower lateral leg Quality: Patient reports experiencing a sharp pain to affected area(s). Severity: Patient states wound are getting worse. Duration: Patient has had the wound for < 4 weeks prior to presenting for treatment Timing: Pain in wound is Intermittent (comes and goes Context: The wound occurred when the patient tripped and had a fall and injured his left leg Modifying Factors: Other treatment(s) tried include:had gone to an urgent care couple of times a day taken an x-ray and was put on antibiotics Associated Signs and Symptoms: Patient reports having increase swelling and redness to the leg 76 year old gentleman who had a injury to the left ankle and foot 3 weeks ago was seen recently in the ER after he had swelling and redness of the left ankle. He is also known to have a flareup of gout recently. After he was examined in the urgent care he was placed on Keflex and Bactrim and referred to the wound center. A  recent x-ray of the left ankle did not show any fractures or abnormality. The patient doesn't take very good care of his health has never been worked up for chronic lymphedema which she's had and sees a physician about once a year. He has not been aware that he has bilateral lower extremity lymphedema. he does not smoke. He has never had a  workup for his lymphedema. 09/29/2016 -- the patient's insurance will not cover the snap vacuum system and he is finding it difficult to get home health as his copayment is $25 each time. He will not be able to afford the [employer] wound VAC either. 10/14/16 patient presents today for fault evaluation concerning his left lateral ankle wound. Unfortunately he has a skin tear which occurred on Monday of this week over the interior shin of his right lower extremity. Fortunately he did we approximate the flap as well as she could and I think this has done fairly well. Nonetheless he continues to have some discomfort at this site which is completely understandable. He is concerned about how well this is going to heal hopefully being that he did catch it early this will heal up quickly. He did question how long this would take I explained that I cannot give him an exact time but hopefully it will not be too long especially since the undermining seems to be filling in rapidly. 10/21/2016 -- besides the fall and the lacerated wound on his right lateral calf the rest of his health has [place], [name] ([id number]) been going okay and I have reviewed his wounds and made appropriate changes 10/28/16 on evaluation today patient appears to be doing well in regard to the left lateral ankle wound. His right anterior shin wound still had Steri-Strips in place so it appears to be doing well. Fortunately there's no evidence of infection. 11/03/16 on evaluation today patient's wound on the left lateral ankle appears to be doing better visually with epithelium migrating in from the non-o'clock  location as well as the undermining improving significantly. I feel like this is overall doing much better. The wound over the anterior shin of his right lower extremity appears to be closed there is no active drainage at this point no evidence of fluctuance underneath and I think this is also healing well. There is no evidence of infection at either site. 12/01/16 on evaluation today patient appears to be doing well in regard to his Current wound care measures. With that being said the only issue is that the collagen seems to be sticking to the wound fairly significantly causing some slight tearing to the new skin. I will say this is not something that we want. Therefore we may need to consider switching to a different dressing. No fevers, chills, nausea, or vomiting noted at this time. Objective Constitutional Well-nourished and well-hydrated in no acute distress. Vitals Time Taken: 12:56 PM, Height: 72 in, Weight: 236 lbs, BMI: 32, Temperature: 97.7 F, Pulse: 54 bpm, Respiratory Rate: 18 breaths/min, Blood Pressure: 108/61 mmHg. Respiratory normal breathing without difficulty. clear to auscultation bilaterally. Cardiovascular regular rate and rhythm with normal S1, S2. Psychiatric this patient is able to make decisions and demonstrates good insight into disease process. Alert and Oriented x 3. pleasant and cooperative. General Notes: Patient's wound appears to have an excellent granular bed without any slough noted. There is no erythema surrounding the wound although the current collagen dressing is getting very stuck to the wound bed and surrounding healing tissue. Integumentary (Hair, Skin) [place], [name] ([id number]) Wound #1 status is Open. Original cause of wound was Trauma. The wound is located on the Left,Lateral Lower Leg. The wound measures 0.8cm length x 0.4cm width x 0.2cm depth; 0.251cm^2 area and 0.05cm^3 volume. There is Fat Layer (Subcutaneous Tissue) Exposed exposed. There  is no tunneling or undermining noted. There is a large amount of serosanguineous drainage noted. The wound  margin is distinct with the outline attached to the wound base. There is large (67-100%) pink granulation within the wound bed. There is a small (1-33%) amount of necrotic tissue within the wound bed including Adherent Slough. The periwound skin appearance exhibited: Induration, Scarring, Erythema. The periwound skin appearance did not exhibit: Callus, Crepitus, Excoriation, Rash, Dry/Scaly, Maceration, Atrophie Blanche, Cyanosis, Ecchymosis, Hemosiderin Staining, Mottled, Pallor, Rubor. The surrounding wound skin color is noted with erythema which is circumferential. Periwound temperature was noted as No Abnormality. 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 Cleanse wound with mild soap and water Anesthetic: Wound #1 Left,Lateral Lower Leg: Topical Lidocaine 4% cream applied to wound bed prior to debridement - for clinic use Skin Barriers/Peri-Wound Care: Wound #1 Left,Lateral Lower Leg: Moisturizing lotion - not on wound Primary Wound Dressing: Wound #1 Left,Lateral Lower Leg: Hydrogel Other: - sorbact Secondary Dressing: Wound #1 Left,Lateral Lower Leg: Dry Gauze Boardered Foam Dressing Dressing Change Frequency: [place], [name] ([id number]) Wound #1 Left,Lateral Lower Leg: Change dressing every other day. Follow-up Appointments: Wound #1 Left,Lateral Lower Leg: Return Appointment in 1 week. Edema Control: Wound #1 Left,Lateral Lower Leg: Patient to wear own compression stockings Elevate legs to the level of  the heart and pump ankles as often as possible Additional Orders / Instructions: Wound #1 Left,Lateral Lower Leg: Increase protein intake. General Notes: I'm going to recommend that we continue with the Current wound care measures for the next week. The only switch I'm going to make [place] discontinue the collagen and we will instead initiate a Sorback Dressing which hopefully will not stick to the wound. We will see him for reevaluation in one week to see were things stand at that point. If anything worsens in the interim patient will contact the office for additional recommendations. Electronic Signature(s) Signed: 12/02/2016 10:18:42 AM By: Lenda Kelp PA-C Entered By: Lenda Kelp on 12/01/2016 13:31:44 [place], [name] ([id number]) -------------------------------------------------------------------------------- SuperBill Details Patient Name: [name] Date of Service: 12/01/2016 Medical Record Number: [id number] Patient Account Number: [id number] Date of Birth/Sex: 07/22/1940 (76 y.o. Male) Treating RN: Phillis Haggis Primary Care Provider: Georgann Housekeeper Other Clinician: Referring Provider: Georgann Housekeeper Treating Provider/Extender: Linwood Dibbles, HOYT Weeks in Treatment: 11 Diagnosis Coding ICD-10 Codes Code Description [phone] Laceration without foreign body, left lower leg, initial encounter M70.862 Other soft tissue disorders related to use, overuse and pressure, left lower leg I89.0 Lymphedema, not elsewhere classified L97.322 Non-pressure chronic ulcer of left ankle with fat layer exposed Facility Procedures CPT4 Code: [id number] Description: 99213 - WOUND CARE VISIT-LEV 3 EST PT Modifier: Quantity: 1 Physician Procedures CPT4: Description Modifier Quantity Code [id number] 99213 - WC PHYS LEVEL 3 - EST PT 1 ICD-10 Description Diagnosis S81.812A Laceration without foreign body, left lower leg, initial encounter M70.862 Other soft tissue disorders related to use, overuse and   pressure, left lower leg I89.0 Lymphedema, not elsewhere classified L97.322 Non-pressure chronic ulcer of left ankle with fat layer exposed Electronic Signature(s) Signed: 12/01/2016 4:59:12 PM By: Alejandro Mulling Signed: 12/02/2016 10:18:42 AM By: Lenda Kelp PA-C Entered By: Alejandro Mulling on 12/01/2016 16:28:35

## 2016-12-05 ENCOUNTER — Other Ambulatory Visit: Payer: Self-pay

## 2016-12-08 ENCOUNTER — Ambulatory Visit (INDEPENDENT_AMBULATORY_CARE_PROVIDER_SITE_OTHER): Payer: PPO | Admitting: Surgery

## 2016-12-08 ENCOUNTER — Encounter: Payer: Self-pay | Admitting: Surgery

## 2016-12-08 VITALS — BP 121/77 | HR 65 | Temp 97.3°F | Ht 72.0 in | Wt 229.6 lb

## 2016-12-08 DIAGNOSIS — L089 Local infection of the skin and subcutaneous tissue, unspecified: Secondary | ICD-10-CM | POA: Diagnosis not present

## 2016-12-08 DIAGNOSIS — L723 Sebaceous cyst: Secondary | ICD-10-CM

## 2016-12-08 NOTE — Progress Notes (Signed)
Outpatient Surgical Follow Up  12/08/2016  [name] is an 76 y.o. male.   CC: Sebaceous cyst  HPI: This patient with a prior history of a sebaceous cyst it's been drained multiple times in the past and he is here for follow-up after an I&D in August 1. He has no complaints at this time. He is doing better with his legs after being seen by the wound care center.  Past Medical History:  Diagnosis Date  . Abnormal EKG   . Edema of both legs   . History of palpitations   . Hypertension   . Personal history of gout     No past surgical history on file.  No family history on file. He has no family history of sebaceous cysts  Social History:  reports that he has never smoked. He has never used smokeless tobacco. He reports that he does not drink alcohol or use drugs.  Allergies:  Allergies  Allergen Reactions  . No Known Allergies     Medications reviewed.   Review of Systems:   Review of Systems  Constitutional: Negative.   HENT: Negative.   Eyes: Negative.   Respiratory: Negative.   Cardiovascular: Negative.   Gastrointestinal: Negative.   Genitourinary: Negative.   Musculoskeletal: Negative.   Skin: Negative.   Neurological: Negative.   Endo/Heme/Allergies: Negative.   Psychiatric/Behavioral: Negative.      Physical Exam:  There were no vitals taken for this visit.  Physical Exam  Constitutional: He is oriented to person, place, and time and well-developed, well-nourished, and in no distress. No distress.  HENT:  Head: Normocephalic and atraumatic.  Eyes: Pupils are equal, round, and reactive to light. Right eye exhibits no discharge. Left eye exhibits no discharge. No scleral icterus.  Neck: Normal range of motion. No JVD present.  Cardiovascular: Normal rate, regular rhythm and normal heart sounds.   Pulmonary/Chest: Effort normal. No respiratory distress. He has no wheezes. He has no rales.  Musculoskeletal: Normal range of motion. He exhibits edema.   Lower extremity dressings.  Lymphadenopathy:    He has no cervical adenopathy.  Neurological: He is alert and oriented to person, place, and time.  Skin: Skin is warm and dry. No rash noted. He is not diaphoretic. No erythema.  Small open area around a large sebaceous cyst with expressible sebum no purulence minimal erythema no drainage.  Psychiatric: Mood and affect normal.  Vitals reviewed.     No results found for this or any previous visit (from the past 48 hour(s)). No results found.  Assessment/Plan:  This a patient with a previously infected sebaceous cyst of his back it's been drained multiple times and now requires excisional biopsy. The rationale for this been discussed the options of observation were reviewed and the risk of bleeding infection recurrence and cosmetic deformity were discussed with him he understood and agreed to proceed  Lattie Hawichard E Brevan Luberto, MD, FACS

## 2016-12-08 NOTE — Patient Instructions (Addendum)
We have your surgery scheduled for 12/22/16 at [facility] with Dr.Cooper. Please see your blue sheet for surgery information. Please call our office with any questions or concerns. Please continue to change the dressing daily.

## 2016-12-09 ENCOUNTER — Encounter: Payer: PPO | Admitting: Physician Assistant

## 2016-12-09 ENCOUNTER — Telehealth: Payer: Self-pay | Admitting: Surgery

## 2016-12-09 DIAGNOSIS — S81802A Unspecified open wound, left lower leg, initial encounter: Secondary | ICD-10-CM | POA: Diagnosis not present

## 2016-12-09 DIAGNOSIS — S81812A Laceration without foreign body, left lower leg, initial encounter: Secondary | ICD-10-CM | POA: Diagnosis not present

## 2016-12-09 NOTE — Telephone Encounter (Signed)
Pt advised of pre op date/time and sx date. Sx: 12/22/16 with Dr. Jeralene Huff of sebaceous cyst-Upper Back.  Pre op: 12/14/16 between 1-5:00pm--Phone.   Patient made aware to call [phone], between 1-3:00pm the day before surgery, to find out what time to arrive.

## 2016-12-11 NOTE — Progress Notes (Signed)
[name], [name] ([id number]) Visit Report for 12/09/2016 Chief Complaint Document Details Patient Name: [name], [name] Date of Service: 12/09/2016 3:30 PM Medical Record Number: [id number] Patient Account Number: [id number] Date of Birth/Sex: 05-06-1940 (76 y.o. Male) Treating RN: Huel Coventry Primary Care Provider: Georgann Housekeeper Other Clinician: Referring Provider: Georgann Housekeeper Treating Provider/Extender: Linwood Dibbles, HOYT Weeks in Treatment: 12 Information Obtained from: Patient Chief Complaint Patient seen for complaints of Non-Healing Wound left lower lateral leg Electronic Signature(s) Signed: 12/09/2016 4:30:32 PM By: Lenda Kelp PA-C Entered By: Lenda Kelp on 12/09/2016 15:55:35 [place], [name] ([id number]) -------------------------------------------------------------------------------- HPI Details Patient Name: [name] Date of Service: 12/09/2016 3:30 PM Medical Record Number: [id number] Patient Account Number: [id number] Date of Birth/Sex: 10-25-1940 (75 y.o. Male) Treating RN: Huel Coventry Primary Care Provider: Georgann Housekeeper Other Clinician: Referring Provider: Georgann Housekeeper Treating Provider/Extender: Linwood Dibbles, HOYT Weeks in Treatment: 12 History of Present Illness Location: left lower lateral leg Quality: Patient reports experiencing a sharp pain to affected area(s). Severity: Patient states wound are getting worse. Duration: Patient has had the wound for < 4 weeks prior to presenting for treatment Timing: Pain in wound is Intermittent (comes and goes Context: The wound occurred when the patient tripped and had a fall and injured his left leg Modifying Factors: Other treatment(s) tried include:had gone to an urgent care couple of times a day taken an x-ray and was put on antibiotics Associated Signs and Symptoms: Patient reports having increase swelling and redness to the leg HPI Description: 76 year old gentleman who had a injury to the left ankle and foot 3 weeks  ago was seen recently in the ER after he had swelling and redness of the left ankle. He is also known to have a flareup of gout recently. After he was examined in the urgent care he was placed on Keflex and Bactrim and referred to the wound center. A recent x-ray of the left ankle did not show any fractures or abnormality. The patient doesn't take very good care of his health has never been worked up for chronic lymphedema which she's had and sees a physician about once a year. He has not been aware that he has bilateral lower extremity lymphedema. he does not smoke. He has never had a workup for his lymphedema. 09/29/2016 -- the patient's insurance will not cover the snap vacuum system and he is finding it difficult to get home health as his copayment is $25 each time. He will not be able to afford the [employer] wound VAC either. 10/14/16 patient presents today for fault evaluation concerning his left lateral ankle wound. Unfortunately he has a skin tear which occurred on Monday of this week over the interior shin of his right lower extremity. Fortunately he did we approximate the flap as well as she could and I think this has done fairly well. Nonetheless he continues to have some discomfort at this site which is completely understandable. He is concerned about how well this is going to heal hopefully being that he did catch it early this will heal up quickly. He did question how long this would take I explained that I cannot give him an exact time but hopefully it will not be too long especially since the undermining seems to be filling in rapidly. 10/21/2016 -- besides the fall and the lacerated wound on his right lateral calf the rest of his health has been going okay and I have reviewed his wounds and made appropriate changes 10/28/16 on evaluation today patient appears to  be doing well in regard to the left lateral ankle wound. His right anterior shin wound still had Steri-Strips in place so it  appears to be doing well. Fortunately there's no evidence of infection. 11/03/16 on evaluation today patient's wound on the left lateral ankle appears to be doing better visually with epithelium migrating in from the non-o'clock location as well as the undermining improving significantly. IBN [place]OS[place], [name] ([id number]) feel like this is overall doing much better. The wound over the anterior shin of his right lower extremity appears to be closed there is no active drainage at this point no evidence of fluctuance underneath and I think this is also healing well. There is no evidence of infection at either site. 12/01/16 on evaluation today patient appears to be doing well in regard to his Current wound care measures. With that being said the only issue is that the collagen seems to be sticking to the wound fairly significantly causing some slight tearing to the new skin. I will say this is not something that we want. Therefore we may need to consider switching to a different dressing. No fevers, chills, nausea, or vomiting noted at this time. 12/09/16 on evaluation today patient has been seem to do well in regard to the dressing. Fortunately it does not have to be sticking to the wound and causing any damage likely were seen last week. The wound bed looks well and there's no evidence of infection. No fevers, chills, nausea, or vomiting noted at this time. Electronic Signature(s) Signed: 12/09/2016 4:30:32 PM By: Lenda Kelp PA-C Entered By: Lenda Kelp on 12/09/2016 16:01:15 [place], [name] ([id number]) -------------------------------------------------------------------------------- Physical Exam Details Patient Name: [name] Date of Service: 12/09/2016 3:30 PM Medical Record Number: [id number] Patient Account Number: [id number] Date of Birth/Sex: [birth year] (76 y.o. Male) Treating RN: Huel Coventry Primary Care Provider: Georgann Housekeeper Other Clinician: Referring Provider: Georgann Housekeeper Treating Provider/Extender: Linwood Dibbles, HOYT Weeks in Treatment: 12 Constitutional Well-nourished and well-hydrated in no acute distress. Respiratory normal breathing without difficulty. clear to auscultation bilaterally. Cardiovascular regular rate and rhythm with normal S1, S2. Psychiatric this patient is able to make decisions and demonstrates good insight into disease process. Alert and Oriented x 3. pleasant and cooperative. Notes Patient's wound shows excellent granulation and no surface slough at this point. I recommend that we continue with the current orders. Electronic Signature(s) Signed: 12/09/2016 4:30:32 PM By: Lenda Kelp PA-C Entered By: Lenda Kelp on 12/09/2016 16:01:38 [place], [name] ([id number]) -------------------------------------------------------------------------------- Physician Orders Details Patient Name: [name] Date of Service: 12/09/2016 3:30 PM Medical Record Number: [id number] Patient Account Number: [id number] Date of Birth/Sex: [birth year] (76 y.o. Male) Treating RN: Huel Coventry Primary Care Provider: Georgann Housekeeper Other Clinician: Referring Provider: Georgann Housekeeper Treating Provider/Extender: Linwood Dibbles, HOYT Weeks in Treatment: 12 Verbal / Phone Orders: No Diagnosis Coding ICD-10 Coding Code Description [phone] Laceration without foreign body, left lower leg, initial encounter [phone] Other soft tissue disorders related to use, overuse and pressure, left lower leg I89.0 Lymphedema, not elsewhere classified L97.322 Non-pressure chronic ulcer of left ankle with fat layer exposed Wound Cleansing Wound #1 Left,Lateral Lower Leg o Clean wound with Normal Saline. o Cleanse wound with mild soap and water Anesthetic Wound #1 Left,Lateral Lower Leg o Topical Lidocaine 4% cream applied to wound bed prior to debridement - for clinic use Skin Barriers/Peri-Wound Care Wound #1 Left,Lateral Lower Leg o Moisturizing lotion - not  on wound Primary Wound Dressing Wound #1 Left,Lateral Lower Leg o Hydrogel   o Other: - sorbact Secondary Dressing Wound #1 Left,Lateral Lower Leg o Dry Gauze o Boardered Foam Dressing Dressing Change Frequency Wound #1 Left,Lateral Lower Leg o Change dressing every other day. [place], [name] ([id number]) Follow-up Appointments Wound #1 Left,Lateral Lower Leg o Return Appointment in 1 week. Edema Control Wound #1 Left,Lateral Lower Leg o Patient to wear own compression stockings o Elevate legs to the level of the heart and pump ankles as often as possible Additional Orders / Instructions Wound #1 Left,Lateral Lower Leg o Increase protein intake. Electronic Signature(s) Signed: 12/09/2016 4:30:32 PM By: Lenda Kelp PA-C Signed: 12/09/2016 5:15:44 PM By: Elliot Gurney, BSN, RN, CWS, Kim RN, BSN Entered By: Elliot Gurney, BSN, RN, CWS, Kim on 12/09/2016 16:05:12 [place], Kathlene November ([id number]) -------------------------------------------------------------------------------- Problem List Details Patient Name: [name] Date of Service: 12/09/2016 3:30 PM Medical Record Number: [id number] Patient Account Number: [id number] Date of Birth/Sex: 1941/03/03 (75 y.o. Male) Treating RN: Huel Coventry Primary Care Provider: Georgann Housekeeper Other Clinician: Referring Provider: Georgann Housekeeper Treating Provider/Extender: Skeet Simmer in Treatment: 12 Active Problems ICD-10 Encounter Code Description Active Date Diagnosis S81.812A Laceration without foreign body, left lower leg, initial 09/15/2016 Yes encounter Y86.578 Other soft tissue disorders related to use, overuse and 09/15/2016 Yes pressure, left lower leg I89.0 Lymphedema, not elsewhere classified 09/15/2016 Yes 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 Inactive Problems Resolved Problems ICD-10 Code Description Active Date Resolved Date L03.116 Cellulitis of left lower limb 09/15/2016  09/15/2016 S81.811A Laceration without foreign body, right lower leg, initial 10/21/2016 10/21/2016 encounter Electronic Signature(s) Signed: 12/09/2016 4:30:32 PM By: Lenda Kelp PA-C Entered By: Lenda Kelp on 12/09/2016 15:55:10 [place], [name] ([id number]) [place], [name] ([id number]) -------------------------------------------------------------------------------- Progress Note Details Patient Name: [name] Date of Service: 12/09/2016 3:30 PM Medical Record Number: [id number] Patient Account Number: [id number] Date of Birth/Sex: 03/17/1941 (76 y.o. Male) Treating RN: Huel Coventry Primary Care Provider: Georgann Housekeeper Other Clinician: Referring Provider: Georgann Housekeeper Treating Provider/Extender: Linwood Dibbles, HOYT Weeks in Treatment: 12 Subjective Chief Complaint Information obtained from Patient Patient seen for complaints of Non-Healing Wound left lower lateral leg History of Present Illness (HPI) The following HPI elements were documented for the patient's wound: Location: left lower lateral leg Quality: Patient reports experiencing a sharp pain to affected area(s). Severity: Patient states wound are getting worse. Duration: Patient has had the wound for < 4 weeks prior to presenting for treatment Timing: Pain in wound is Intermittent (comes and goes Context: The wound occurred when the patient tripped and had a fall and injured his left leg Modifying Factors: Other treatment(s) tried include:had gone to an urgent care couple of times a day taken an x-ray and was put on antibiotics Associated Signs and Symptoms: Patient reports having increase swelling and redness to the leg 76 year old gentleman who had a injury to the left ankle and foot 3 weeks ago was seen recently in the ER after he had swelling and redness of the left ankle. He is also known to have a flareup of gout recently. After he was examined in the urgent care he was placed on Keflex and Bactrim and referred to the  wound center. A recent x-ray of the left ankle did not show any fractures or abnormality. The patient doesn't take very good care of his health has never been worked up for chronic lymphedema which she's had and sees a physician about once a year. He has not been aware that he has bilateral lower extremity lymphedema.  he does not smoke. He has never had a workup for his lymphedema. 09/29/2016 -- the patient's insurance will not cover the snap vacuum system and he is finding it difficult to get home health as his copayment is $25 each time. He will not be able to afford the [employer] wound VAC either. 10/14/16 patient presents today for fault evaluation concerning his left lateral ankle wound. Unfortunately he has a skin tear which occurred on Monday of this week over the interior shin of his right lower extremity. Fortunately he did we approximate the flap as well as she could and I think this has done fairly well. Nonetheless he continues to have some discomfort at this site which is completely understandable. He is concerned about how well this is going to heal hopefully being that he did catch it early this will heal up quickly. He did question how long this would take I explained that I cannot give him an exact time but hopefully it will not be too long especially since the undermining seems to be filling in rapidly. 10/21/2016 -- besides the fall and the lacerated wound on his right lateral calf the rest of his health has [place], [name] ([id number]) been going okay and I have reviewed his wounds and made appropriate changes 10/28/16 on evaluation today patient appears to be doing well in regard to the left lateral ankle wound. His right anterior shin wound still had Steri-Strips in place so it appears to be doing well. Fortunately there's no evidence of infection. 11/03/16 on evaluation today patient's wound on the left lateral ankle appears to be doing better visually with epithelium migrating in from  the non-o'clock location as well as the undermining improving significantly. I feel like this is overall doing much better. The wound over the anterior shin of his right lower extremity appears to be closed there is no active drainage at this point no evidence of fluctuance underneath and I think this is also healing well. There is no evidence of infection at either site. 12/01/16 on evaluation today patient appears to be doing well in regard to his Current wound care measures. With that being said the only issue is that the collagen seems to be sticking to the wound fairly significantly causing some slight tearing to the new skin. I will say this is not something that we want. Therefore we may need to consider switching to a different dressing. No fevers, chills, nausea, or vomiting noted at this time. 12/09/16 on evaluation today patient has been seem to do well in regard to the dressing. Fortunately it does not have to be sticking to the wound and causing any damage likely were seen last week. The wound bed looks well and there's no evidence of infection. No fevers, chills, nausea, or vomiting noted at this time. Objective Constitutional Well-nourished and well-hydrated in no acute distress. Vitals Time Taken: 3:46 PM, Height: 72 in, Weight: 236 lbs, BMI: 32, Temperature: 98.7 F, Pulse: 62 bpm, Respiratory Rate: 18 breaths/min, Blood Pressure: 128/62 mmHg. Respiratory normal breathing without difficulty. clear to auscultation bilaterally. Cardiovascular regular rate and rhythm with normal S1, S2. Psychiatric this patient is able to make decisions and demonstrates good insight into disease process. Alert and Oriented x 3. pleasant and cooperative. General Notes: Patient's wound shows excellent granulation and no surface slough at this point. I recommend that we continue with the current orders. [place], [name] ([id number]) Integumentary (Hair, Skin) Wound #1 status is Open. Original cause of  wound was Trauma. The  wound is located on the Left,Lateral Lower Leg. The wound measures 0.5cm length x 0.4cm width x 0.1cm depth; 0.157cm^2 area and 0.016cm^3 volume. There is Fat Layer (Subcutaneous Tissue) Exposed exposed. There is no tunneling or undermining noted. There is a large amount of serosanguineous drainage noted. The wound margin is distinct with the outline attached to the wound base. There is large (67-100%) pink granulation within the wound bed. There is a small (1-33%) amount of necrotic tissue within the wound bed including Adherent Slough. The periwound skin appearance exhibited: Induration, Scarring, Erythema. The periwound skin appearance did not exhibit: Callus, Crepitus, Excoriation, Rash, Dry/Scaly, Maceration, Atrophie Blanche, Cyanosis, Ecchymosis, Hemosiderin Staining, Mottled, Pallor, Rubor. The surrounding wound skin color is noted with erythema which is circumferential. Periwound temperature was noted as No Abnormality. 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 Cleanse wound with mild soap and water Anesthetic: Wound #1 Left,Lateral Lower Leg: Topical Lidocaine 4% cream applied to wound bed prior to debridement - for clinic use Skin Barriers/Peri-Wound Care: Wound #1 Left,Lateral Lower Leg: Moisturizing lotion - not on wound Primary Wound Dressing: Wound #1 Left,Lateral Lower Leg: Hydrogel Other: - sorbact Secondary Dressing: Wound #1 Left,Lateral Lower Leg: Dry Gauze [place], [name] ([id number]) Boardered Foam Dressing Dressing Change Frequency: Wound #1 Left,Lateral Lower Leg: Change  dressing every other day. Follow-up Appointments: Wound #1 Left,Lateral Lower Leg: Return Appointment in 1 week. Edema Control: Wound #1 Left,Lateral Lower Leg: Patient to wear own compression stockings Elevate legs to the level of the heart and pump ankles as often as possible Additional Orders / Instructions: Wound #1 Left,Lateral Lower Leg: Increase protein intake. Patient's dressing will stay the same for the next week and then we will subsequently see him for reevaluation in one week to see were things stand. If anything worsened significantly in the meantime he will contact the office for additional recommendations otherwise hopefully this will continue to show improvement in fully close in the next couple of weeks or so. Electronic Signature(s) Signed: 12/09/2016 5:00:21 PM By: Lenda Kelp PA-C Previous Signature: 12/09/2016 4:30:32 PM Version By: Lenda Kelp PA-C Entered By: Lenda Kelp on 12/09/2016 16:35:30 [place], [name] ([id number]) -------------------------------------------------------------------------------- SuperBill Details Patient Name: [name] Date of Service: 12/09/2016 Medical Record Number: [id number] Patient Account Number: [id number] Date of Birth/Sex: [birth year] (76 y.o. Male) Treating RN: Huel Coventry Primary Care Provider: Georgann Housekeeper Other Clinician: Referring Provider: Georgann Housekeeper Treating Provider/Extender: Linwood Dibbles, HOYT Weeks in Treatment: 12 Diagnosis Coding ICD-10 Codes Code Description [phone] Laceration without foreign body, left lower leg, initial encounter M70.862 Other soft tissue disorders related to use, overuse and pressure, left lower leg I89.0 Lymphedema, not elsewhere classified L97.322 Non-pressure chronic ulcer of left ankle with fat layer exposed Facility Procedures CPT4 Code: 13244010 Description: 27253 - WOUND CARE VISIT-LEV 2 EST PT Modifier: Quantity: 1 Physician Procedures CPT4: Description Modifier  Quantity Code [id number] - WC PHYS LEVEL 3 - EST PT 1 ICD-10 Description Diagnosis S81.812A Laceration without foreign body, left lower leg, initial encounter M70.862 Other soft tissue disorders related to use, overuse and  pressure, left lower leg I89.0 Lymphedema, not elsewhere classified L97.322 Non-pressure chronic ulcer of left ankle with fat  layer exposed Electronic Signature(s) Signed: 12/09/2016 4:30:32 PM By: Lenda Kelp PA-C Signed: 12/09/2016 5:15:44 PM By: Elliot Gurney, BSN, RN, CWS, Kim RN, BSN Entered By: Elliot Gurney, BSN, RN, CWS, Kim on 12/09/2016 [phone]

## 2016-12-11 NOTE — Progress Notes (Signed)
LION, FERNANDEZ ([id number]) Visit Report for 12/09/2016 Arrival Information Details Patient Name: [name], [name] Date of Service: 12/09/2016 3:30 PM Medical Record Number: [id number] Patient Account Number: [id number] Date of Birth/Sex: Oct 27, 1940 (76 y.o. Male) Treating RN: Huel Coventry Primary Care Pallavi Clifton: Georgann Housekeeper Other Clinician: Referring Milica Gully: Georgann Housekeeper Treating Nathon Stefanski/Extender: Linwood Dibbles, HOYT Weeks in Treatment: 12 Visit Information History Since Last Visit Added or deleted any medications: Yes Patient Arrived: Ambulatory Any new allergies or adverse reactions: No Arrival Time: 15:45 Had a fall or experienced change in No Accompanied By: self activities of daily living that may affect Transfer Assistance: None risk of falls: Patient Identification Verified: Yes Signs or symptoms of abuse/neglect since last No Secondary Verification Process Yes visito Completed: Hospitalized since last visit: No Patient Requires Transmission- No Has Dressing in Place as Prescribed: Yes Based Precautions: Pain Present Now: No Patient Has Alerts: Yes Patient Alerts: L ABI non- compressible R ABI non- compressible Electronic Signature(s) Signed: 12/09/2016 5:15:44 PM By: Elliot Gurney, BSN, RN, CWS, Kim RN, BSN Entered By: Elliot Gurney, BSN, RN, CWS, Kim on 12/09/2016 15:46:28 [place], Kathlene November ([id number]) -------------------------------------------------------------------------------- Clinic Level of Care Assessment Details Patient Name: [name] Date of Service: 12/09/2016 3:30 PM Medical Record Number: [id number] Patient Account Number: [id number] Date of Birth/Sex: Mar 28, 1941 (76 y.o. Male) Treating RN: Huel Coventry Primary Care Eliya Geiman: Georgann Housekeeper Other Clinician: Referring Solei Wubben: Georgann Housekeeper Treating Shunte Senseney/Extender: Linwood Dibbles, HOYT Weeks in Treatment: 12 Clinic Level of Care Assessment Items TOOL 4 Quantity Score []  - Use when only an EandM is performed on FOLLOW-UP  visit 0 ASSESSMENTS - Nursing Assessment / Reassessment []  - Reassessment of Co-morbidities (includes updates in patient status) 0 X - Reassessment of Adherence to Treatment Plan 1 5 ASSESSMENTS - Wound and Skin Assessment / Reassessment X - Simple Wound Assessment / Reassessment - one wound 1 5 []  - Complex Wound Assessment / Reassessment - multiple wounds 0 []  - Dermatologic / Skin Assessment (not related to wound area) 0 ASSESSMENTS - Focused Assessment []  - Circumferential Edema Measurements - multi extremities 0 []  - Nutritional Assessment / Counseling / Intervention 0 []  - Lower Extremity Assessment (monofilament, tuning fork, pulses) 0 []  - Peripheral Arterial Disease Assessment (using hand held doppler) 0 ASSESSMENTS - Ostomy and/or Continence Assessment and Care []  - Incontinence Assessment and Management 0 []  - Ostomy Care Assessment and Management (repouching, etc.) 0 PROCESS - Coordination of Care X - Simple Patient / Family Education for ongoing care 1 15 []  - Complex (extensive) Patient / Family Education for ongoing care 0 []  - Staff obtains Chiropractor, Records, Test Results / Process Orders 0 []  - Staff telephones HHA, Nursing Homes / Clarify orders / etc 0 []  - Routine Transfer to another Facility (non-emergent condition) 0 [place]ero, [name] ([id number]) []  - Routine Hospital Admission (non-emergent condition) 0 []  - New Admissions / Manufacturing engineer / Ordering NPWT, Apligraf, etc. 0 []  - Emergency Hospital Admission (emergent condition) 0 X - Simple Discharge Coordination 1 10 []  - Complex (extensive) Discharge Coordination 0 PROCESS - Special Needs []  - Pediatric / Minor Patient Management 0 []  - Isolation Patient Management 0 []  - Hearing / Language / Visual special needs 0 []  - Assessment of Community assistance (transportation, D/C planning, etc.) 0 []  - Additional assistance / Altered mentation 0 []  - Support Surface(s) Assessment (bed, cushion, seat, etc.)  0 INTERVENTIONS - Wound Cleansing / Measurement X - Simple Wound Cleansing - one wound 1 5 []  - Complex Wound Cleansing - multiple wounds 0  X - Wound Imaging (photographs - any number of wounds) 1 5 []  - Wound Tracing (instead of photographs) 0 X - Simple Wound Measurement - one wound 1 5 []  - Complex Wound Measurement - multiple wounds 0 INTERVENTIONS - Wound Dressings []  - Small Wound Dressing one or multiple wounds 0 X - Medium Wound Dressing one or multiple wounds 1 15 []  - Large Wound Dressing one or multiple wounds 0 []  - Application of Medications - topical 0 []  - Application of Medications - injection 0 INTERVENTIONS - Miscellaneous []  - External ear exam 0 [place]ters, [name] ([id number]) []  - Specimen Collection (cultures, biopsies, blood, body fluids, etc.) 0 []  - Specimen(s) / Culture(s) sent or taken to Lab for analysis 0 []  - Patient Transfer (multiple staff / Michiel Sites Lift / Similar devices) 0 []  - Simple Staple / Suture removal (25 or less) 0 []  - Complex Staple / Suture removal (26 or more) 0 []  - Hypo / Hyperglycemic Management (close monitor of Blood Glucose) 0 []  - Ankle / Brachial Index (ABI) - do not check if billed separately 0 X - Vital Signs 1 5 Has the patient been seen at the hospital within the last three years: Yes Total Score: 70 Level Of Care: New/Established - Level 2 Electronic Signature(s) Signed: 12/09/2016 5:15:44 PM By: Elliot Gurney, BSN, RN, CWS, Kim RN, BSN Entered By: Elliot Gurney, BSN, RN, CWS, Kim on 12/09/2016 16:05:40 [place], [name] ([id number]) -------------------------------------------------------------------------------- Encounter Discharge Information Details Patient Name: [name] Date of Service: 12/09/2016 3:30 PM Medical Record Number: [id number] Patient Account Number: [id number] Date of Birth/Sex: [birth year] (75 y.o. Male) Treating RN: Huel Coventry Primary Care Keeli Roberg: Georgann Housekeeper Other Clinician: Referring Saahir Prude: Georgann Housekeeper Treating  Ruari Mudgett/Extender: Linwood Dibbles, HOYT Weeks in Treatment: 12 Encounter Discharge Information Items Discharge Pain Level: 0 Discharge Condition: Stable Ambulatory Status: Ambulatory Discharge Destination: Home Transportation: Private Auto Accompanied By: self Schedule Follow-up Appointment: Yes Medication Reconciliation completed and provided to Patient/Care Yes Lakitha Gordy: Provided on Clinical Summary of Care: 12/09/2016 Form Type Recipient Paper Patient MH Electronic Signature(s) Signed: 12/09/2016 5:15:44 PM By: Elliot Gurney, BSN, RN, CWS, Kim RN, BSN Entered By: Elliot Gurney, BSN, RN, CWS, Kim on 12/09/2016 16:06:28 [place], Kathlene November ([id number]) -------------------------------------------------------------------------------- Lower Extremity Assessment Details Patient Name: [name] Date of Service: 12/09/2016 3:30 PM Medical Record Number: [id number] Patient Account Number: [id number] Date of Birth/Sex: 04/17/1941 (75 y.o. Male) Treating RN: Huel Coventry Primary Care Rifka Ramey: Georgann Housekeeper Other Clinician: Referring Jaquanna Ballentine: Georgann Housekeeper Treating Kypton Eltringham/Extender: Linwood Dibbles, HOYT Weeks in Treatment: 12 Vascular Assessment Pulses: Dorsalis Pedis Palpable: [Left:Yes] Posterior Tibial Extremity colors, hair growth, and conditions: Extremity Color: [Left:Normal] Hair Growth on Extremity: [Left:Yes] Temperature of Extremity: [Left:Warm] Capillary Refill: [Left:< 3 seconds] Toe Nail Assessment Left: Right: Thick: No Discolored: No Deformed: No Improper Length and Hygiene: Yes Electronic Signature(s) Signed: 12/09/2016 5:15:44 PM By: Elliot Gurney, BSN, RN, CWS, Kim RN, BSN Entered By: Elliot Gurney, BSN, RN, CWS, Kim on 12/09/2016 15:53:05 [place], [name] ([id number]) -------------------------------------------------------------------------------- Multi Wound Chart Details Patient Name: [name] Date of Service: 12/09/2016 3:30 PM Medical Record Number: [id number] Patient Account Number:  [id number] Date of Birth/Sex: 28-Jul-1940 (76 y.o. Male) Treating RN: Huel Coventry Primary Care Taylyn Brame: Georgann Housekeeper Other Clinician: Referring Tobey Lippard: Georgann Housekeeper Treating Marcellina Jonsson/Extender: STONE III, HOYT Weeks in Treatment: 12 Vital Signs Height(in): 72 Pulse(bpm): 62 Weight(lbs): 236 Blood Pressure 128/62 (mmHg): Body Mass Index(BMI): 32 Temperature(F): 98.7 Respiratory Rate 18 (breaths/min): Photos: [1:No Photos] [N/A:N/A] Wound Location: [1:Left Lower Leg - Lateral] [N/A:N/A] Wounding Event: [1:Trauma] [N/A:N/A] Primary Etiology: [  1:Trauma, Other] [N/A:N/A] Comorbid History: [1:Cataracts, Hypertension, Gout] [N/A:N/A] Date Acquired: [1:08/25/2016] [N/A:N/A] Weeks of Treatment: [1:12] [N/A:N/A] Wound Status: [1:Open] [N/A:N/A] Measurements L x W x D 0.5x0.4x0.1 [N/A:N/A] (cm) Area (cm) : [1:0.157] [N/A:N/A] Volume (cm) : [1:0.016] [N/A:N/A] % Reduction in Area: [1:95.30%] [N/A:N/A] % Reduction in Volume: 95.20% [N/A:N/A] Classification: [1:Full Thickness Without Exposed Support Structures] [N/A:N/A] Exudate Amount: [1:Large] [N/A:N/A] Exudate Type: [1:Serosanguineous] [N/A:N/A] Exudate Color: [1:red, brown] [N/A:N/A] Wound Margin: [1:Distinct, outline attached] [N/A:N/A] Granulation Amount: [1:Large (67-100%)] [N/A:N/A] Granulation Quality: [1:Pink] [N/A:N/A] Necrotic Amount: [1:Small (1-33%)] [N/A:N/A] Exposed Structures: [1:Fat Layer (Subcutaneous Tissue) Exposed: Yes Fascia: No Tendon: No Muscle: No] [N/A:N/A] Joint: No Bone: No Epithelialization: Small (1-33%) N/A N/A Periwound Skin Texture: Induration: Yes N/A N/A Scarring: Yes Excoriation: No Callus: No Crepitus: No Rash: No Periwound Skin Maceration: No N/A N/A Moisture: Dry/Scaly: No Periwound Skin Color: Erythema: Yes N/A N/A Atrophie Blanche: No Cyanosis: No Ecchymosis: No Hemosiderin Staining: No Mottled: No Pallor: No Rubor: No Erythema Location: Circumferential N/A  N/A Temperature: No Abnormality N/A N/A Tenderness on No N/A N/A Palpation: Wound Preparation: Ulcer Cleansing: N/A N/A Rinsed/Irrigated with Saline, Other: soap and water Topical Anesthetic Applied: None Treatment Notes Electronic Signature(s) Signed: 12/09/2016 5:15:44 PM By: Elliot Gurney, BSN, RN, CWS, Kim RN, BSN Entered By: Elliot Gurney, BSN, RN, CWS, Kim on 12/09/2016 15:54:11 [place], [name] ([id number]) -------------------------------------------------------------------------------- Multi-Disciplinary Care Plan Details Patient Name: [name] Date of Service: 12/09/2016 3:30 PM Medical Record Number: [id number] Patient Account Number: [id number] Date of Birth/Sex: 1940/10/31 (76 y.o. Male) Treating RN: Huel Coventry Primary Care Aymara Sassi: Georgann Housekeeper Other Clinician: Referring Leontine Radman: Georgann Housekeeper Treating Rayann Jolley/Extender: Linwood Dibbles, HOYT Weeks in Treatment: 12 Active Inactive ` Abuse / Safety / Falls / Self Care Management Nursing Diagnoses: History of Falls Potential for falls Goals: Patient will remain injury free related to falls Date Initiated: 09/15/2016 Target Resolution Date: 11/26/2016 Goal Status: Active Interventions: Assess fall risk on admission and as needed Assess impairment of mobility on admission and as needed per policy Notes: ` Nutrition Nursing Diagnoses: Imbalanced nutrition Potential for alteratiion in Nutrition/Potential for imbalanced nutrition Goals: Patient/caregiver agrees to and verbalizes understanding of need to use nutritional supplements and/or vitamins as prescribed Date Initiated: 09/15/2016 Target Resolution Date: 12/31/2016 Goal Status: Active Interventions: Assess patient nutrition upon admission and as needed per policy Notes: ` Orientation to the Wound Care Program [place], [place] ([id number]) Nursing Diagnoses: Knowledge deficit related to the wound healing center program Goals: Patient/caregiver will verbalize understanding of  the Wound Healing Center Program Date Initiated: 09/15/2016 Target Resolution Date: 10/01/2016 Goal Status: Active Interventions: Provide education on orientation to the wound center Notes: ` Pain, Acute or Chronic Nursing Diagnoses: Pain, acute or chronic: actual or potential Potential alteration in comfort, pain Goals: Patient/caregiver will verbalize adequate pain control between visits Date Initiated: 09/15/2016 Target Resolution Date: 12/31/2016 Goal Status: Active Interventions: Complete pain assessment as per visit requirements Notes: ` Wound/Skin Impairment Nursing Diagnoses: Impaired tissue integrity Knowledge deficit related to ulceration/compromised skin integrity Goals: Ulcer/skin breakdown will have a volume reduction of 80% by week 12 Date Initiated: 09/15/2016 Target Resolution Date: 12/24/2016 Goal Status: Active Interventions: Assess patient/caregiver ability to perform ulcer/skin care regimen upon admission and as needed Notes: QUARTEZ, LAGOS ([id number]) Electronic Signature(s) Signed: 12/09/2016 5:15:44 PM By: Elliot Gurney, BSN, RN, CWS, Kim RN, BSN Entered By: Elliot Gurney, BSN, RN, CWS, Kim on 12/09/2016 15:54:05 [place], [name] ([id number]) -------------------------------------------------------------------------------- Pain Assessment Details Patient Name: [name] Date of Service: 12/09/2016 3:30 PM Medical Record Number: [id number] Patient Account  Number: [id number] Date of Birth/Sex: February 05, 1941 (76 y.o. Male) Treating RN: Huel Coventry Primary Care Geno Sydnor: Georgann Housekeeper Other Clinician: Referring Teylor Wolven: Georgann Housekeeper Treating Kendle Erker/Extender: Linwood Dibbles, HOYT Weeks in Treatment: 12 Active Problems Location of Pain Severity and Description of Pain Patient Has Paino No Site Locations With Dressing Change: No Pain Management and Medication Current Pain Management: Goals for Pain Management Topical or injectable lidocaine is offered to patient for acute pain  when surgical debridement is performed. If needed, Patient is instructed to use over the counter pain medication for the following 24-48 hours after debridement. Wound care MDs do not prescribed pain medications. Patient has chronic pain or uncontrolled pain. Patient has been instructed to make an appointment with their Primary Care Physician for pain management. Electronic Signature(s) Signed: 12/09/2016 5:15:44 PM By: Elliot Gurney, BSN, RN, CWS, Kim RN, BSN Entered By: Elliot Gurney, BSN, RN, CWS, Kim on 12/09/2016 15:46:37 [place], Kathlene November ([id number]) -------------------------------------------------------------------------------- Patient/Caregiver Education Details Patient Name: [name] Date of Service: 12/09/2016 3:30 PM Medical Record Number: [id number] Patient Account Number: [id number] Date of Birth/Gender: 1940-06-27 (76 y.o. Male) Treating RN: Huel Coventry Primary Care Physician: Georgann Housekeeper Other Clinician: Referring Physician: Georgann Housekeeper Treating Physician/Extender: Skeet Simmer in Treatment: 12 Education Assessment Education Provided To: Patient Education Topics Provided Wound/Skin Impairment: Handouts: Caring for Your Ulcer, Other: continue wound care as prescribed Methods: Demonstration, Explain/Verbal Responses: State content correctly Electronic Signature(s) Signed: 12/09/2016 5:15:44 PM By: Elliot Gurney, BSN, RN, CWS, Kim RN, BSN Entered By: Elliot Gurney, BSN, RN, CWS, Kim on 12/09/2016 16:06:54 [place], Kathlene November ([id number]) -------------------------------------------------------------------------------- Wound Assessment Details Patient Name: [name] Date of Service: 12/09/2016 3:30 PM Medical Record Number: [id number] Patient Account Number: [id number] Date of Birth/Sex: 1940/08/12 (77 y.o. Male) Treating RN: Huel Coventry Primary Care Shaniquia Brafford: Georgann Housekeeper Other Clinician: Referring Aasir Daigler: Georgann Housekeeper Treating Maren Wiesen/Extender: Linwood Dibbles, HOYT Weeks in Treatment:  12 Wound Status Wound Number: 1 Primary Etiology: Trauma, Other Wound Location: Left Lower Leg - Lateral Wound Status: Open Wounding Event: Trauma Comorbid History: Cataracts, Hypertension, Gout Date Acquired: 08/25/2016 Weeks Of Treatment: 12 Clustered Wound: No Photos Photo Uploaded By: Elliot Gurney, BSN, RN, CWS, Kim on 12/09/2016 16:58:21 Wound Measurements Length: (cm) 0.5 Width: (cm) 0.4 Depth: (cm) 0.1 Area: (cm) 0.157 Volume: (cm) 0.016 % Reduction in Area: 95.3% % Reduction in Volume: 95.2% Epithelialization: Small (1-33%) Tunneling: No Undermining: No Wound Description Full Thickness Without Exposed Foul Odor After Classification: Support Structures Slough/Fibrino Wound Margin: Distinct, outline attached Exudate Large Amount: Exudate Type: Serosanguineous Exudate Color: red, brown Cleansing: No No Wound Bed Granulation Amount: Large (67-100%) Exposed Structure Granulation Quality: Pink Fascia Exposed: No Necrotic Amount: Small (1-33%) Fat Layer (Subcutaneous Tissue) Exposed: Yes Necrotic Quality: Adherent Slough Tendon Exposed: No Muscle Exposed: No Dom, [name] ([id number]) Joint Exposed: No Bone Exposed: No Periwound Skin Texture Texture Color No Abnormalities Noted: No No Abnormalities Noted: No Callus: No Atrophie Blanche: No Crepitus: No Cyanosis: No Excoriation: No Ecchymosis: No Induration: Yes Erythema: Yes Rash: No Erythema Location: Circumferential Scarring: Yes Hemosiderin Staining: No Mottled: No Moisture Pallor: No No Abnormalities Noted: No Rubor: No Dry / Scaly: No Maceration: No Temperature / Pain Temperature: No Abnormality Wound Preparation Ulcer Cleansing: Rinsed/Irrigated with Saline, Other: soap and water, Topical Anesthetic Applied: None Treatment Notes Wound #1 (Left, Lateral Lower Leg) 1. Cleansed with: Clean wound with Normal Saline 2. Anesthetic Topical Lidocaine 4% cream to wound bed prior to  debridement 4. Dressing Applied: Other dressing (specify in notes) 5. Secondary Dressing Applied Bordered Foam Dressing  Notes Building surveyor) Signed: 12/09/2016 5:15:44 PM By: Elliot Gurney, BSN, RN, CWS, Kim RN, BSN Entered By: Elliot Gurney, BSN, RN, CWS, Kim on 12/09/2016 15:52:37 [place], Kathlene November ([id number]) -------------------------------------------------------------------------------- Vitals Details Patient Name: [name] Date of Service: 12/09/2016 3:30 PM Medical Record Number: [id number] Patient Account Number: [id number] Date of Birth/Sex: [birth year] (75 y.o. Male) Treating RN: Huel Coventry Primary Care Jie Stickels: Georgann Housekeeper Other Clinician: Referring Kinslee Dalpe: Georgann Housekeeper Treating Kourtney Montesinos/Extender: Linwood Dibbles, HOYT Weeks in Treatment: 12 Vital Signs Time Taken: 15:46 Temperature (F): 98.7 Height (in): 72 Pulse (bpm): 62 Weight (lbs): 236 Respiratory Rate (breaths/min): 18 Body Mass Index (BMI): 32 Blood Pressure (mmHg): 128/62 Reference Range: 80 - 120 mg / dl Electronic Signature(s) Signed: 12/09/2016 5:15:44 PM By: Elliot Gurney, BSN, RN, CWS, Kim RN, BSN Entered By: Elliot Gurney, BSN, RN, CWS, Kim on 12/09/2016 15:48:45

## 2016-12-14 ENCOUNTER — Encounter
Admission: RE | Admit: 2016-12-14 | Discharge: 2016-12-14 | Disposition: A | Discharge status: Home / Self Care | Payer: PPO | Source: Ambulatory Visit | Attending: Surgery | Admitting: Surgery

## 2016-12-14 NOTE — Patient Instructions (Signed)
  Your procedure is scheduled on:12/22/16 Report to Day Surgery. [facility]OND FLOOR To find out your arrival time please call [phone] between 1PM - 3PM on 12/21/16 Remember: Instructions that are not followed completely may result in serious medical risk, up to and including death, or upon the discretion of your surgeon and anesthesiologist your surgery may need to be rescheduled.    __X__ 1. Do not eat food or drink liquids after midnight. No gum chewing or hard candies.     ____ 2. No Alcohol for 24 hours before or after surgery.   ____ 3. Do Not Smoke For 24 Hours Prior to Your Surgery.   ____ 4. Bring all medications with you on the day of surgery if instructed.    ___X_ 5. Notify your doctor if there is any change in your medical condition     (cold, fever, infections).       Do not wear jewelry, make-up, hairpins, clips or nail polish.  Do not wear lotions, powders, or perfumes. You may wear deodorant.  Do not shave 48 hours prior to surgery. Men may shave face and neck.  Do not bring valuables to the hospital.    [facility] is not responsible for any belongings or valuables.               Contacts, dentures or bridgework may not be worn into surgery.  Leave your suitcase in the car. After surgery it may be brought to your room.  For patients admitted to the hospital, discharge time is determined by your                treatment team.   Patients discharged the day of surgery will not be allowed to drive home.   Please read over the following fact sheets that you were given:   Surgical Site Infection Prevention   ___X_ Take these medicines the morning of surgery with A SIP OF WATER:    1. ALLOPURINOL  2. LISINOPRIL  3. METOPROLOL  4.  5.  6.  ____ Fleet Enema (as directed)   __X__ Use CHG Soap as directed  ____ Use inhalers on the day of surgery  ____ Stop metformin 2 days prior to surgery    ____ Take 1/2 of usual insulin dose the night before  surgery and none on the morning of surgery.   ____ Stop Coumadin/Plavix/aspirin on   ____ Stop Anti-inflammatories on    ____ Stop supplements until after surgery.    ____ Bring C-Pap to the hospital.

## 2016-12-16 ENCOUNTER — Encounter: Payer: PPO | Admitting: Surgery

## 2016-12-16 ENCOUNTER — Inpatient Hospital Stay: Admission: RE | Admit: 2016-12-16 | Payer: Self-pay | Source: Ambulatory Visit

## 2016-12-16 DIAGNOSIS — S81812A Laceration without foreign body, left lower leg, initial encounter: Secondary | ICD-10-CM | POA: Diagnosis not present

## 2016-12-16 DIAGNOSIS — S81812D Laceration without foreign body, left lower leg, subsequent encounter: Secondary | ICD-10-CM | POA: Diagnosis not present

## 2016-12-16 DIAGNOSIS — I890 Lymphedema, not elsewhere classified: Secondary | ICD-10-CM | POA: Diagnosis not present

## 2016-12-19 ENCOUNTER — Inpatient Hospital Stay: Admission: RE | Admit: 2016-12-19 | Payer: Self-pay | Source: Ambulatory Visit

## 2016-12-19 ENCOUNTER — Telehealth: Payer: Self-pay | Admitting: General Practice

## 2016-12-19 NOTE — Telephone Encounter (Signed)
Patient called and left a message said he needs to rescheduled his surgery due to his wife, so he needs to postpone the surgery into sometime next month please call patient and r/s surgery.

## 2016-12-20 NOTE — Progress Notes (Signed)
[name], [name] ([id number]) Visit Report for 12/16/2016 Chief Complaint Document Details Patient Name: [name], [name] Date of Service: 12/16/2016 9:15 AM Medical Record Number: [id number] Patient Account Number: [id number] Date of Birth/Sex: 08-24-1940 (76 y.o. Male) Treating RN: Huel Coventry Primary Care Provider: Georgann Housekeeper Other Clinician: Referring Provider: Georgann Housekeeper Treating Provider/Extender: Rudene Re in Treatment: 13 Information Obtained from: Patient Chief Complaint Patient seen for complaints of Non-Healing Wound left lower lateral leg Electronic Signature(s) Signed: 12/16/2016 11:54:07 AM By: Evlyn Kanner MD, FACS Entered By: Evlyn Kanner on 12/16/2016 09:32:29 [name], [name] ([id number]) -------------------------------------------------------------------------------- HPI Details Patient Name: [name] Date of Service: 12/16/2016 9:15 AM Medical Record Number: [id number] Patient Account Number: [id number] Date of Birth/Sex: 12/12/1940 (76 y.o. Male) Treating RN: Huel Coventry Primary Care Provider: Georgann Housekeeper Other Clinician: Referring Provider: Georgann Housekeeper Treating Provider/Extender: Rudene Re in Treatment: 13 History of Present Illness Location: left lower lateral leg Quality: Patient reports experiencing a sharp pain to affected area(s). Severity: Patient states wound are getting worse. Duration: Patient has had the wound for < 4 weeks prior to presenting for treatment Timing: Pain in wound is Intermittent (comes and goes Context: The wound occurred when the patient tripped and had a fall and injured his left leg Modifying Factors: Other treatment(s) tried include:had gone to an urgent care couple of times a day taken an x-ray and was put on antibiotics Associated Signs and Symptoms: Patient reports having increase swelling and redness to the leg HPI Description: 76 year old gentleman who had a injury to the left ankle and foot 3 weeks ago  was seen recently in the ER after he had swelling and redness of the left ankle. He is also known to have a flareup of gout recently. After he was examined in the urgent care he was placed on Keflex and Bactrim and referred to the wound center. A recent x-ray of the left ankle did not show any fractures or abnormality. The patient doesn't take very good care of his health has never been worked up for chronic lymphedema which she's had and sees a physician about once a year. He has not been aware that he has bilateral lower extremity lymphedema. he does not smoke. He has never had a workup for his lymphedema. 09/29/2016 -- the patient's insurance will not cover the snap vacuum system and he is finding it difficult to get home health as his copayment is $25 each time. He will not be able to afford the [employer] wound VAC either. 10/14/16 patient presents today for fault evaluation concerning his left lateral ankle wound. Unfortunately he has a skin tear which occurred on Monday of this week over the interior shin of his right lower extremity. Fortunately he did we approximate the flap as well as she could and I think this has done fairly well. Nonetheless he continues to have some discomfort at this site which is completely understandable. He is concerned about how well this is going to heal hopefully being that he did catch it early this will heal up quickly. He did question how long this would take I explained that I cannot give him an exact time but hopefully it will not be too long especially since the undermining seems to be filling in rapidly. 10/21/2016 -- besides the fall and the lacerated wound on his right lateral calf the rest of his health has been going okay and I have reviewed his wounds and made appropriate changes 10/28/16 on evaluation today patient appears to be doing well  in regard to the left lateral ankle wound. His right anterior shin wound still had Steri-Strips in place so it appears  to be doing well. Fortunately there's no evidence of infection. 11/03/16 on evaluation today patient's wound on the left lateral ankle appears to be doing better visually with epithelium migrating in from the non-o'clock location as well as the undermining improving significantly. [name], [name] ([id number]) feel like this is overall doing much better. The wound over the anterior shin of his right lower extremity appears to be closed there is no active drainage at this point no evidence of fluctuance underneath and I think this is also healing well. There is no evidence of infection at either site. 12/01/16 on evaluation today patient appears to be doing well in regard to his Current wound care measures. With that being said the only issue is that the collagen seems to be sticking to the wound fairly significantly causing some slight tearing to the new skin. I will say this is not something that we want. Therefore we may need to consider switching to a different dressing. No fevers, chills, nausea, or vomiting noted at this time. 12/09/16 on evaluation today patient has been seem to do well in regard to the dressing. Fortunately it does not have to be sticking to the wound and causing any damage likely were seen last week. The wound bed looks well and there's no evidence of infection. No fevers, chills, nausea, or vomiting noted at this time. Electronic Signature(s) Signed: 12/16/2016 11:54:07 AM By: Evlyn Kanner MD, FACS Entered By: Evlyn Kanner on 12/16/2016 09:32:41 [name], [name] ([id number]) -------------------------------------------------------------------------------- Physical Exam Details Patient Name: [name] Date of Service: 12/16/2016 9:15 AM Medical Record Number: [id number] Patient Account Number: [id number] Date of Birth/Sex: 1941/03/12 (76 y.o. Male) Treating RN: Huel Coventry Primary Care Provider: Georgann Housekeeper Other Clinician: Referring Provider: Georgann Housekeeper Treating  Provider/Extender: Rudene Re in Treatment: 13 Constitutional . Pulse regular. Respirations normal and unlabored. Afebrile. . Eyes Nonicteric. Reactive to light. Ears, Nose, Mouth, and Throat Lips, teeth, and gums WNL.Marland Kitchen Moist mucosa without lesions. Neck supple and nontender. No palpable supraclavicular or cervical adenopathy. Normal sized without goiter. Respiratory WNL. No retractions.. Cardiovascular Pedal Pulses WNL. No clubbing, cyanosis or edema. Lymphatic No adneopathy. No adenopathy. No adenopathy. Musculoskeletal Adexa without tenderness or enlargement.. Digits and nails w/o clubbing, cyanosis, infection, petechiae, ischemia, or inflammatory conditions.. Integumentary (Hair, Skin) No suspicious lesions. No crepitus or fluctuance. No peri-wound warmth or erythema. No masses.Marland Kitchen Psychiatric Judgement and insight Intact.. No evidence of depression, anxiety, or agitation.. Notes the wound is completely healed. His lymphedema is also better controlled. Electronic Signature(s) Signed: 12/16/2016 11:54:07 AM By: Evlyn Kanner MD, FACS Entered By: Evlyn Kanner on 12/16/2016 09:33:00 [name], [name] ([id number]) -------------------------------------------------------------------------------- Physician Orders Details Patient Name: [name] Date of Service: 12/16/2016 9:15 AM Medical Record Number: [id number] Patient Account Number: [id number] Date of Birth/Sex: [birth year] (76 y.o. Male) Treating RN: Huel Coventry Primary Care Provider: Georgann Housekeeper Other Clinician: Referring Provider: Georgann Housekeeper Treating Provider/Extender: Rudene Re in Treatment: 21 Verbal / Phone Orders: No Diagnosis Coding Wound Cleansing o Clean wound with Normal Saline. Edema Control o Patient to wear own compression stockings Discharge From [facility] o Discharge from [facility]ound Care Center - treatment complete Electronic Signature(s) Signed: 12/16/2016 11:54:07 AM By: Evlyn Kanner MD, FACS Signed: 12/20/2016 7:55:48 AM By: Elliot Gurney, BSN, RN, CWS, Kim RN, BSN Entered By: Elliot Gurney, BSN, RN, CWS, Kim on 12/16/2016 09:29:30 [place], [name] ([id number]) -------------------------------------------------------------------------------- Problem  List Details Patient Name: KINCAID, TIGER Date of Service: 12/16/2016 9:15 AM Medical Record Number: [id number] Patient Account Number: [id number] Date of Birth/Sex: 01-21-1941 (76 y.o. 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 Inactive Problems Resolved Problems ICD-10 Code Description Active Date Resolved Date L03.116 Cellulitis of left lower limb 09/15/2016 09/15/2016 S81.811A Laceration without foreign body, right lower leg, initial 10/21/2016 10/21/2016 encounter Electronic Signature(s) Signed: 12/16/2016 11:54:07 AM By: Evlyn Kanner MD, FACS Entered By: Evlyn Kanner on 12/16/2016 09:32:19 [place], [name] ([id number]) [place], [name] ([id number]) -------------------------------------------------------------------------------- Progress Note Details Patient Name: [name] Date of Service: 12/16/2016 9:15 AM Medical Record Number: [id number] Patient Account Number: [id number] Date of Birth/Sex: 04/28/1940 (76 y.o. Male) Treating RN: Huel Coventry Primary Care Provider: Georgann Housekeeper Other Clinician: Referring Provider: Georgann Housekeeper Treating Provider/Extender: Rudene Re in  Treatment: 13 Subjective Chief Complaint Information obtained from Patient Patient seen for complaints of Non-Healing Wound left lower lateral leg History of Present Illness (HPI) The following HPI elements were documented for the patient's wound: Location: left lower lateral leg Quality: Patient reports experiencing a sharp pain to affected area(s). Severity: Patient states wound are getting worse. Duration: Patient has had the wound for < 4 weeks prior to presenting for treatment Timing: Pain in wound is Intermittent (comes and goes Context: The wound occurred when the patient tripped and had a fall and injured his left leg Modifying Factors: Other treatment(s) tried include:had gone to an urgent care couple of times a day taken an x-ray and was put on antibiotics Associated Signs and Symptoms: Patient reports having increase swelling and redness to the leg 76 year old gentleman who had a injury to the left ankle and foot 3 weeks ago was seen recently in the ER after he had swelling and redness of the left ankle. He is also known to have a flareup of gout recently. After he was examined in the urgent care he was placed on Keflex and Bactrim and referred to the wound center. A recent x-ray of the left ankle did not show any fractures or abnormality. The patient doesn't take very good care of his health has never been worked up for chronic lymphedema which she's had and sees a physician about once a year. He has not been aware that he has bilateral lower extremity lymphedema. he does not smoke. He has never had a workup for his lymphedema. 09/29/2016 -- the patient's insurance will not cover the snap vacuum system and he is finding it difficult to get home health as his copayment is $25 each time. He will not be able to afford the [employer] wound VAC either. 10/14/16 patient presents today for fault evaluation concerning his left lateral ankle wound. Unfortunately he has a skin tear which  occurred on Monday of this week over the interior shin of his right lower extremity. Fortunately he did we approximate the flap as well as she could and I think this has done fairly well. Nonetheless he continues to have some discomfort at this site which is completely understandable. He is concerned about how well this is going  to heal hopefully being that he did catch it early this will heal up quickly. He did question how long this would take I explained that I cannot give him an exact time but hopefully it will not be too long especially since the undermining seems to be filling in rapidly. 10/21/2016 -- besides the fall and the lacerated wound on his right lateral calf the rest of his health has [name], [name] ([id number]) been going okay and I have reviewed his wounds and made appropriate changes 10/28/16 on evaluation today patient appears to be doing well in regard to the left lateral ankle wound. His right anterior shin wound still had Steri-Strips in place so it appears to be doing well. Fortunately there's no evidence of infection. 11/03/16 on evaluation today patient's wound on the left lateral ankle appears to be doing better visually with epithelium migrating in from the non-o'clock location as well as the undermining improving significantly. I feel like this is overall doing much better. The wound over the anterior shin of his right lower extremity appears to be closed there is no active drainage at this point no evidence of fluctuance underneath and I think this is also healing well. There is no evidence of infection at either site. 12/01/16 on evaluation today patient appears to be doing well in regard to his Current wound care measures. With that being said the only issue is that the collagen seems to be sticking to the wound fairly significantly causing some slight tearing to the new skin. I will say this is not something that we want. Therefore we may need to consider switching to a  different dressing. No fevers, chills, nausea, or vomiting noted at this time. 12/09/16 on evaluation today patient has been seem to do well in regard to the dressing. Fortunately it does not have to be sticking to the wound and causing any damage likely were seen last week. The wound bed looks well and there's no evidence of infection. No fevers, chills, nausea, or vomiting noted at this time. Objective Constitutional Pulse regular. Respirations normal and unlabored. Afebrile. Vitals Time Taken: 9:18 AM, Height: 72 in, Weight: 236 lbs, BMI: 32, Temperature: 97.7 F, Pulse: 61 bpm, Respiratory Rate: 16 breaths/min, Blood Pressure: 130/54 mmHg. Eyes Nonicteric. Reactive to light. Ears, Nose, Mouth, and Throat Lips, teeth, and gums WNL.Marland Kitchen Moist mucosa without lesions. Neck supple and nontender. No palpable supraclavicular or cervical adenopathy. Normal sized without goiter. Respiratory WNL. No retractions.[name], [name] ([id number]) Cardiovascular Pedal Pulses WNL. No clubbing, cyanosis or edema. Lymphatic No adneopathy. No adenopathy. No adenopathy. Musculoskeletal Adexa without tenderness or enlargement.. Digits and nails w/o clubbing, cyanosis, infection, petechiae, ischemia, or inflammatory conditions.Marland Kitchen Psychiatric Judgement and insight Intact.. No evidence of depression, anxiety, or agitation.. General Notes: the wound is completely healed. His lymphedema is also better controlled. Integumentary (Hair, Skin) No suspicious lesions. No crepitus or fluctuance. No peri-wound warmth or erythema. No masses.. Wound #1 status is Healed - Epithelialized. Original cause of wound was Trauma. The wound is located on the Left,Lateral Lower Leg. The wound measures 0cm length x 0cm width x 0cm depth; 0cm^2 area and 0cm^3 volume. 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 Edema Control: Patient to  wear own compression stockings Discharge From [facility] Services: Discharge from [facility] - treatment complete [name], [name] ([id number]) after review today I have discharged him from the wound care services with the following recommendations: 1. Elevation and exercise as he has got significant lymphedema 2. he has got his lymphedema compression stockings and I have discussed at length his need to wear them regularly and he has been instructed in how to do this 3. protect the wound with a bordered foam on the left lower extremity wound. Electronic Signature(s) Signed: 12/16/2016 11:54:07 AM By: Evlyn Kanner MD, FACS Entered By: Evlyn Kanner on 12/16/2016 09:34:17 [place], [name] ([id number]) -------------------------------------------------------------------------------- SuperBill Details Patient Name: [name] Date of Service: 12/16/2016 Medical Record Number: [id number] Patient Account Number: [id number] Date of Birth/Sex: 03-20-1941 (76 y.o. Male) Treating RN: Huel Coventry Primary Care Provider: Georgann Housekeeper Other Clinician: Referring Provider: Georgann Housekeeper Treating Provider/Extender: Rudene Re in Treatment: 13 Diagnosis Coding ICD-10 Codes Code Description [phone] Laceration without foreign body, left lower leg, initial encounter [phone] Other soft tissue disorders related to use, overuse and pressure, left lower leg I89.0 Lymphedema, not elsewhere classified L97.322 Non-pressure chronic ulcer of left ankle with fat layer exposed Facility Procedures CPT4 Code: [id number] Description: 01027 - WOUND CARE VISIT-LEV 2 EST PT Modifier: Quantity: 1 Physician Procedures CPT4: Description Modifier Quantity Code [id number] - WC PHYS  LEVEL 2 - EST PT 1 ICD-10 Description Diagnosis S81.812A Laceration without foreign body, left lower leg, initial encounter M70.862 Other soft tissue disorders related to use, overuse and  pressure, left lower leg I89.0 Lymphedema, not elsewhere classified L97.322 Non-pressure chronic ulcer of left ankle with fat layer exposed Electronic Signature(s) Signed: 12/16/2016 11:54:07 AM By: Evlyn Kanner MD, FACS Entered By: Evlyn Kanner on 12/16/2016 09:34:34

## 2016-12-20 NOTE — Progress Notes (Signed)
DAMIONE, ROBIDEAU ([id number]) Visit Report for 12/16/2016 Arrival Information Details Patient Name: [name], WASSINK Date of Service: 12/16/2016 9:15 AM Medical Record Number: [id number] Patient Account Number: [id number] Date of Birth/Sex: Nov 20, 1940 (76 y.o. Male) Treating RN: Huel Coventry Primary Care Aubery Douthat: Georgann Housekeeper Other Clinician: Referring Danelly Hassinger: Georgann Housekeeper Treating Cristie Mckinney/Extender: Rudene Re in Treatment: 13 Visit Information History Since Last Visit Added or deleted any medications: No Patient Arrived: Ambulatory Any new allergies or adverse reactions: No Arrival Time: 09:15 Had a fall or experienced change in No Accompanied By: self activities of daily living that may affect Transfer Assistance: Manual risk of falls: Patient Identification Verified: Yes Signs or symptoms of abuse/neglect since last No Secondary Verification Process Yes visito Completed: Hospitalized since last visit: No Patient Requires Transmission- No Has Dressing in Place as Prescribed: Yes Based Precautions: Pain Present Now: No Patient Has Alerts: Yes Patient Alerts: L ABI non- compressible R ABI non- compressible Electronic Signature(s) Signed: 12/20/2016 7:55:48 AM By: Elliot Gurney, BSN, RN, CWS, Kim RN, BSN Entered By: Elliot Gurney, BSN, RN, CWS, Kim on 12/16/2016 09:17:04 [place], Kathlene November ([id number]) -------------------------------------------------------------------------------- Clinic Level of Care Assessment Details Patient Name: [name] Date of Service: 12/16/2016 9:15 AM Medical Record Number: [id number] Patient Account Number: [id number] Date of Birth/Sex: [birth year] (76 y.o. Male) Treating RN: Huel Coventry Primary Care Annick Dimaio: Georgann Housekeeper Other Clinician: Referring Aylani Spurlock: Georgann Housekeeper Treating Abagale Boulos/Extender: Rudene Re in Treatment: 13 Clinic Level of Care Assessment Items TOOL 4 Quantity Score []  - Use when only an EandM is performed on FOLLOW-UP  visit 0 ASSESSMENTS - Nursing Assessment / Reassessment []  - Reassessment of Co-morbidities (includes updates in patient status) 0 []  - Reassessment of Adherence to Treatment Plan 0 ASSESSMENTS - Wound and Skin Assessment / Reassessment X - Simple Wound Assessment / Reassessment - one wound 1 5 []  - Complex Wound Assessment / Reassessment - multiple wounds 0 []  - Dermatologic / Skin Assessment (not related to wound area) 0 ASSESSMENTS - Focused Assessment []  - Circumferential Edema Measurements - multi extremities 0 []  - Nutritional Assessment / Counseling / Intervention 0 []  - Lower Extremity Assessment (monofilament, tuning fork, pulses) 0 []  - Peripheral Arterial Disease Assessment (using hand held doppler) 0 ASSESSMENTS - Ostomy and/or Continence Assessment and Care []  - Incontinence Assessment and Management 0 []  - Ostomy Care Assessment and Management (repouching, etc.) 0 PROCESS - Coordination of Care X - Simple Patient / Family Education for ongoing care 1 15 []  - Complex (extensive) Patient / Family Education for ongoing care 0 []  - Staff obtains Chiropractor, Records, Test Results / Process Orders 0 []  - Staff telephones HHA, Nursing Homes / Clarify orders / etc 0 []  - Routine Transfer to another Facility (non-emergent condition) 0 [place], [name] ([id number]) []  - Routine Hospital Admission (non-emergent condition) 0 []  - New Admissions / Manufacturing engineer / Ordering NPWT, Apligraf, etc. 0 []  - Emergency Hospital Admission (emergent condition) 0 X - Simple Discharge Coordination 1 10 []  - Complex (extensive) Discharge Coordination 0 PROCESS - Special Needs []  - Pediatric / Minor Patient Management 0 []  - Isolation Patient Management 0 []  - Hearing / Language / Visual special needs 0 []  - Assessment of Community assistance (transportation, D/C planning, etc.) 0 []  - Additional assistance / Altered mentation 0 []  - Support Surface(s) Assessment (bed, cushion, seat, etc.)  0 INTERVENTIONS - Wound Cleansing / Measurement X - Simple Wound Cleansing - one wound 1 5 []  - Complex Wound Cleansing - multiple wounds 0 X - Wound  Imaging (photographs - any number of wounds) 1 5 []  - Wound Tracing (instead of photographs) 0 X - Simple Wound Measurement - one wound 1 5 []  - Complex Wound Measurement - multiple wounds 0 INTERVENTIONS - Wound Dressings []  - Small Wound Dressing one or multiple wounds 0 []  - Medium Wound Dressing one or multiple wounds 0 []  - Large Wound Dressing one or multiple wounds 0 []  - Application of Medications - topical 0 []  - Application of Medications - injection 0 INTERVENTIONS - Miscellaneous []  - External ear exam 0 [place], [name] ([id number]) []  - Specimen Collection (cultures, biopsies, blood, body fluids, etc.) 0 []  - Specimen(s) / Culture(s) sent or taken to Lab for analysis 0 []  - Patient Transfer (multiple staff / Michiel Sites Lift / Similar devices) 0 []  - Simple Staple / Suture removal (25 or less) 0 []  - Complex Staple / Suture removal (26 or more) 0 []  - Hypo / Hyperglycemic Management (close monitor of Blood Glucose) 0 []  - Ankle / Brachial Index (ABI) - do not check if billed separately 0 X - Vital Signs 1 5 Has the patient been seen at the hospital within the last three years: Yes Total Score: 50 Level Of Care: New/Established - Level 2 Electronic Signature(s) Signed: 12/20/2016 7:55:48 AM By: Elliot Gurney, BSN, RN, CWS, Kim RN, BSN Entered By: Elliot Gurney, BSN, RN, CWS, Kim on 12/16/2016 09:30:01 Petros, Kathlene November ([id number]) -------------------------------------------------------------------------------- Encounter Discharge Information Details Patient Name: [name] Date of Service: 12/16/2016 9:15 AM Medical Record Number: [id number] Patient Account Number: [id number] Date of Birth/Sex: [birth year] (75 y.o. Male) Treating RN: Huel Coventry Primary Care Irl Bodie: Georgann Housekeeper Other Clinician: Referring Bridgid Printz: Georgann Housekeeper Treating  Lovel Suazo/Extender: Rudene Re in Treatment: 13 Encounter Discharge Information Items Discharge Pain Level: 0 Discharge Condition: Stable Ambulatory Status: Ambulatory Discharge Destination: Home Transportation: Private Auto Accompanied By: wife Schedule Follow-up Appointment: Yes Medication Reconciliation completed and provided to Patient/Care Yes Alejos Reinhardt: Provided on Clinical Summary of Care: 12/16/2016 Form Type Recipient Paper Patient MH Electronic Signature(s) Signed: 12/20/2016 7:55:48 AM By: Elliot Gurney, BSN, RN, CWS, Kim RN, BSN Entered By: Elliot Gurney, BSN, RN, CWS, Kim on 12/16/2016 09:30:32 [place], Kathlene November ([id number]) -------------------------------------------------------------------------------- Lower Extremity Assessment Details Patient Name: [name] Date of Service: 12/16/2016 9:15 AM Medical Record Number: [id number] Patient Account Number: [id number] Date of Birth/Sex: 05/01/1940 (75 y.o. Male) Treating RN: Huel Coventry Primary Care Emmanuel Ercole: Georgann Housekeeper Other Clinician: Referring Krystena Reitter: Georgann Housekeeper Treating Mersadies Petree/Extender: Rudene Re in Treatment: 13 Vascular Assessment Pulses: Dorsalis Pedis Palpable: [Left:Yes] Posterior Tibial Palpable: [Left:Yes] Extremity colors, hair growth, and conditions: Extremity Color: [Left:Normal] Hair Growth on Extremity: [Left:No] Temperature of Extremity: [Left:Warm] Capillary Refill: [Left:< 3 seconds] Toe Nail Assessment Left: Right: Thick: No Discolored: No Deformed: No Improper Length and Hygiene: Yes Electronic Signature(s) Signed: 12/20/2016 7:55:48 AM By: Elliot Gurney, BSN, RN, CWS, Kim RN, BSN Entered By: Elliot Gurney, BSN, RN, CWS, Kim on 12/16/2016 09:21:15 [place], Kathlene November ([id number]) -------------------------------------------------------------------------------- Multi Wound Chart Details Patient Name: [name] Date of Service: 12/16/2016 9:15 AM Medical Record Number: [id number] Patient Account  Number: [id number] Date of Birth/Sex: [birth year] (75 y.o. Male) Treating RN: Huel Coventry Primary Care Antonios Ostrow: Georgann Housekeeper Other Clinician: Referring Jettson Crable: Georgann Housekeeper Treating Shaylah Mcghie/Extender: Rudene Re in Treatment: 13 Vital Signs Height(in): 72 Pulse(bpm): 61 Weight(lbs): 236 Blood Pressure 130/54 (mmHg): Body Mass Index(BMI): 32 Temperature(F): 97.7 Respiratory Rate 16 (breaths/min): Photos: [1:No Photos] [N/A:N/A] Wound Location: [1:Left, Lateral Lower Leg] [N/A:N/A] Wounding Event: [1:Trauma] [N/A:N/A] Primary Etiology: [1:Trauma, Other] [N/A:N/A] Date Acquired: [1:08/25/2016] [  N/A:N/A] Weeks of Treatment: [1:13] [N/A:N/A] Wound Status: [1:Healed - Epithelialized] [N/A:N/A] Measurements L x W x D 0x0x0 [N/A:N/A] (cm) Area (cm) : [1:0] [N/A:N/A] Volume (cm) : [1:0] [N/A:N/A] % Reduction in Area: [1:100.00%] [N/A:N/A] % Reduction in Volume: 100.00% [N/A:N/A] Classification: [1:Full Thickness Without Exposed Support Structures] [N/A:N/A] Periwound Skin Texture: No Abnormalities Noted [N/A:N/A] Periwound Skin [1:No Abnormalities Noted] [N/A:N/A] Moisture: Periwound Skin Color: No Abnormalities Noted [N/A:N/A] Tenderness on [1:No] [N/A:N/A] Treatment Notes Electronic Signature(s) Signed: 12/16/2016 11:54:07 AM By: Evlyn Kanner MD, FACS Entered By: Evlyn Kanner on 12/16/2016 09:32:23 [place], [name] ([id number]) [employer], [name] ([id number]) -------------------------------------------------------------------------------- Multi-Disciplinary Care Plan Details Patient Name: [name] Date of Service: 12/16/2016 9:15 AM Medical Record Number: [id number] Patient Account Number: [id number] Date of Birth/Sex: 1940-11-26 (76 y.o. Male) Treating RN: Huel Coventry Primary Care Jaquann Guarisco: Georgann Housekeeper Other Clinician: Referring Grafton Warzecha: Georgann Housekeeper Treating Teale Goodgame/Extender: Rudene Re in Treatment: 13 Active Inactive Electronic  Signature(s) Signed: 12/20/2016 7:55:48 AM By: Elliot Gurney, BSN, RN, CWS, Kim RN, BSN Entered By: Elliot Gurney, BSN, RN, CWS, Kim on 12/16/2016 09:24:41 [place], [name] ([id number]) -------------------------------------------------------------------------------- Pain Assessment Details Patient Name: [name] Date of Service: 12/16/2016 9:15 AM Medical Record Number: [id number] Patient Account Number: [id number] Date of Birth/Sex: 1940-06-16 (75 y.o. Male) Treating RN: Huel Coventry Primary Care Elizabethanne Lusher: Georgann Housekeeper Other Clinician: Referring Anitha Kreiser: Georgann Housekeeper Treating Tamella Tuccillo/Extender: Rudene Re in Treatment: 13 Active Problems Location of Pain Severity and Description of Pain Patient Has Paino No Site Locations With Dressing Change: No Pain Management and Medication Current Pain Management: Goals for Pain Management Topical or injectable lidocaine is offered to patient for acute pain when surgical debridement is performed. If needed, Patient is instructed to use over the counter pain medication for the following 24-48 hours after debridement. Wound care MDs do not prescribed pain medications. Patient has chronic pain or uncontrolled pain. Patient has been instructed to make an appointment with their Primary Care Physician for pain management. Electronic Signature(s) Signed: 12/20/2016 7:55:48 AM By: Elliot Gurney, BSN, RN, CWS, Kim RN, BSN Entered By: Elliot Gurney, BSN, RN, CWS, Kim on 12/16/2016 09:17:13 [place], Kathlene November ([id number]) -------------------------------------------------------------------------------- Patient/Caregiver Education Details Patient Name: [name] Date of Service: 12/16/2016 9:15 AM Medical Record Number: [id number] Patient Account Number: [id number] Date of Birth/Gender: 08/01/1940 (75 y.o. Male) Treating RN: Huel Coventry Primary Care Physician: Georgann Housekeeper Other Clinician: Referring Physician: Georgann Housekeeper Treating Physician/Extender: Rudene Re in  Treatment: 13 Education Assessment Education Provided To: Patient Education Topics Provided Venous: Handouts: Controlling Swelling with Compression Stockings , Other: wear stockings every day Methods: Demonstration Responses: State content correctly Electronic Signature(s) Signed: 12/20/2016 7:55:48 AM By: Elliot Gurney, BSN, RN, CWS, Kim RN, BSN Entered By: Elliot Gurney, BSN, RN, CWS, Kim on 12/16/2016 09:30:55 [place], Kathlene November ([id number]) -------------------------------------------------------------------------------- Wound Assessment Details Patient Name: [name] Date of Service: 12/16/2016 9:15 AM Medical Record Number: [id number] Patient Account Number: [id number] Date of Birth/Sex: 04-30-1940 (75 y.o. Male) Treating RN: Huel Coventry Primary Care Pearlee Arvizu: Georgann Housekeeper Other Clinician: Referring Cordelro Gautreau: Georgann Housekeeper Treating Nicolai Labonte/Extender: Rudene Re in Treatment: 13 Wound Status Wound Number: 1 Primary Etiology: Trauma, Other Wound Location: Left, Lateral Lower Leg Wound Status: Healed - Epithelialized Wounding Event: Trauma Date Acquired: 08/25/2016 Weeks Of Treatment: 13 Clustered Wound: No Photos Photo Uploaded By: Elliot Gurney, BSN, RN, CWS, Kim on 12/16/2016 09:32:32 Wound Measurements Length: (cm) 0 % Reducti Width: (cm) 0 % Reducti Depth: (cm) 0 Area: (cm) 0 Volume: (cm) 0 on in Area: 100% on in Volume: 100% Wound Description Full Thickness Without  Exposed Classification: Support Structures Periwound Skin Texture Texture Color No Abnormalities Noted: No No Abnormalities Noted: No Moisture No Abnormalities Noted: No Electronic Signature(s) Signed: 12/20/2016 7:55:48 AM By: Elliot Gurney, BSN, RN, CWS, Kim RN, BSN Entered By: Elliot Gurney, BSN, RN, CWS, Kim on 12/16/2016 09:24:17 [place], [name] ([id number]) [place], [place] ([id number]) -------------------------------------------------------------------------------- Vitals Details Patient Name: [name] Date of Service:  12/16/2016 9:15 AM Medical Record Number: [id number] Patient Account Number: [id number] Date of Birth/Sex: 08/17/1940 (75 y.o. Male) Treating RN: Huel Coventry Primary Care Kynisha Memon: Georgann Housekeeper Other Clinician: Referring Savita Runner: Georgann Housekeeper Treating Brien Lowe/Extender: Rudene Re in Treatment: 13 Vital Signs Time Taken: 09:18 Temperature (F): 97.7 Height (in): 72 Pulse (bpm): 61 Weight (lbs): 236 Respiratory Rate (breaths/min): 16 Body Mass Index (BMI): 32 Blood Pressure (mmHg): 130/54 Reference Range: 80 - 120 mg / dl Electronic Signature(s) Signed: 12/20/2016 7:55:48 AM By: Elliot Gurney, BSN, RN, CWS, Kim RN, BSN Entered By: Elliot Gurney, BSN, RN, CWS, Kim on 12/16/2016 09:19:10

## 2016-12-23 ENCOUNTER — Ambulatory Visit: Payer: Self-pay | Admitting: Surgery

## 2016-12-23 NOTE — Telephone Encounter (Signed)
Pt advised of pre op date/time and sx date. Sx: 01/26/17 with Dr Cooper--Excision of sebaceous cyst--upper back.  Pre op: 01/19/17 between 9-1:00pm--phone.   Patient made aware to call [phone], between 1-3:00pm the day before surgery, to find out what time to arrive.

## 2016-12-27 DIAGNOSIS — R001 Bradycardia, unspecified: Secondary | ICD-10-CM | POA: Diagnosis not present

## 2016-12-27 DIAGNOSIS — I10 Essential (primary) hypertension: Secondary | ICD-10-CM | POA: Diagnosis not present

## 2017-01-10 DIAGNOSIS — I77810 Thoracic aortic ectasia: Secondary | ICD-10-CM | POA: Diagnosis not present

## 2017-01-10 DIAGNOSIS — D649 Anemia, unspecified: Secondary | ICD-10-CM | POA: Diagnosis not present

## 2017-01-10 DIAGNOSIS — I340 Nonrheumatic mitral (valve) insufficiency: Secondary | ICD-10-CM | POA: Diagnosis not present

## 2017-01-10 DIAGNOSIS — Z8739 Personal history of other diseases of the musculoskeletal system and connective tissue: Secondary | ICD-10-CM | POA: Diagnosis not present

## 2017-01-10 DIAGNOSIS — I10 Essential (primary) hypertension: Secondary | ICD-10-CM | POA: Diagnosis not present

## 2017-01-12 ENCOUNTER — Ambulatory Visit: Payer: Self-pay | Admitting: Surgery

## 2017-01-19 ENCOUNTER — Telehealth: Payer: Self-pay | Admitting: Surgery

## 2017-01-19 ENCOUNTER — Inpatient Hospital Stay
Admission: RE | Admit: 2017-01-19 | Discharge: 2017-01-19 | Disposition: A | Discharge status: Home / Self Care | Payer: Self-pay | Source: Ambulatory Visit

## 2017-01-19 NOTE — Pre-Procedure Instructions (Addendum)
SPOKE WITH PATIENT WHO STATES SURGERY HAS BEEN CNL AND MAY NOT NEED TO BE RESCHEDULED. STILL ON OR SCHEDULE. NOTIFIED ANGIE AT DR COOPER'S

## 2017-01-19 NOTE — Telephone Encounter (Signed)
Patient has called and would like to cancel his surgery-excision of sebaceous cyst-upper back with Dr Excell Seltzer on 01/26/17. He stated that he [facility] and states that the cyst looks fine at the moment. I have advised the patient to call us if he needs [place] in the future.

## 2017-01-23 DIAGNOSIS — Z8739 Personal history of other diseases of the musculoskeletal system and connective tissue: Secondary | ICD-10-CM | POA: Diagnosis not present

## 2017-01-23 DIAGNOSIS — L298 Other pruritus: Secondary | ICD-10-CM | POA: Diagnosis not present

## 2017-01-23 DIAGNOSIS — I10 Essential (primary) hypertension: Secondary | ICD-10-CM | POA: Diagnosis not present

## 2017-01-23 DIAGNOSIS — L239 Allergic contact dermatitis, unspecified cause: Secondary | ICD-10-CM | POA: Diagnosis not present

## 2017-01-23 DIAGNOSIS — D649 Anemia, unspecified: Secondary | ICD-10-CM | POA: Diagnosis not present

## 2017-01-23 DIAGNOSIS — Z23 Encounter for immunization: Secondary | ICD-10-CM | POA: Diagnosis not present

## 2017-01-24 DIAGNOSIS — I340 Nonrheumatic mitral (valve) insufficiency: Secondary | ICD-10-CM | POA: Diagnosis not present

## 2017-01-24 DIAGNOSIS — Z8739 Personal history of other diseases of the musculoskeletal system and connective tissue: Secondary | ICD-10-CM | POA: Diagnosis not present

## 2017-01-24 DIAGNOSIS — D509 Iron deficiency anemia, unspecified: Secondary | ICD-10-CM | POA: Diagnosis not present

## 2017-01-24 DIAGNOSIS — Z125 Encounter for screening for malignant neoplasm of prostate: Secondary | ICD-10-CM | POA: Diagnosis not present

## 2017-01-24 DIAGNOSIS — I10 Essential (primary) hypertension: Secondary | ICD-10-CM | POA: Diagnosis not present

## 2017-01-24 DIAGNOSIS — D649 Anemia, unspecified: Secondary | ICD-10-CM | POA: Diagnosis not present

## 2017-01-24 DIAGNOSIS — I77810 Thoracic aortic ectasia: Secondary | ICD-10-CM | POA: Diagnosis not present

## 2017-01-26 ENCOUNTER — Ambulatory Visit: Admission: RE | Admit: 2017-01-26 | Payer: PPO | Source: Ambulatory Visit | Admitting: Surgery

## 2017-01-26 ENCOUNTER — Encounter: Admission: RE | Payer: Self-pay | Source: Ambulatory Visit

## 2017-01-26 SURGERY — EXCISION MASS
Anesthesia: General

## 2017-01-27 ENCOUNTER — Inpatient Hospital Stay (HOSPITAL_COMMUNITY)
Admission: EM | Admit: 2017-01-27 | Discharge: 2017-01-29 | DRG: 071 | Disposition: A | Discharge status: Home / Self Care | Payer: PPO | Attending: Internal Medicine | Admitting: Internal Medicine

## 2017-01-27 ENCOUNTER — Emergency Department (HOSPITAL_COMMUNITY): Payer: PPO

## 2017-01-27 ENCOUNTER — Encounter (HOSPITAL_COMMUNITY): Payer: Self-pay | Admitting: Emergency Medicine

## 2017-01-27 DIAGNOSIS — Z7982 Long term (current) use of aspirin: Secondary | ICD-10-CM | POA: Diagnosis not present

## 2017-01-27 DIAGNOSIS — G9340 Encephalopathy, unspecified: Secondary | ICD-10-CM | POA: Diagnosis present

## 2017-01-27 DIAGNOSIS — Z79899 Other long term (current) drug therapy: Secondary | ICD-10-CM

## 2017-01-27 DIAGNOSIS — R9431 Abnormal electrocardiogram [ECG] [EKG]: Secondary | ICD-10-CM | POA: Diagnosis not present

## 2017-01-27 DIAGNOSIS — G9341 Metabolic encephalopathy: Principal | ICD-10-CM | POA: Diagnosis present

## 2017-01-27 DIAGNOSIS — F1020 Alcohol dependence, uncomplicated: Secondary | ICD-10-CM

## 2017-01-27 DIAGNOSIS — E871 Hypo-osmolality and hyponatremia: Secondary | ICD-10-CM

## 2017-01-27 DIAGNOSIS — R4020 Unspecified coma: Secondary | ICD-10-CM | POA: Diagnosis not present

## 2017-01-27 DIAGNOSIS — M109 Gout, unspecified: Secondary | ICD-10-CM | POA: Diagnosis not present

## 2017-01-27 DIAGNOSIS — E860 Dehydration: Secondary | ICD-10-CM | POA: Diagnosis present

## 2017-01-27 DIAGNOSIS — R4702 Dysphasia: Secondary | ICD-10-CM

## 2017-01-27 DIAGNOSIS — I10 Essential (primary) hypertension: Secondary | ICD-10-CM | POA: Diagnosis present

## 2017-01-27 DIAGNOSIS — R402441 Other coma, without documented Glasgow coma scale score, or with partial score reported, in the field [EMT or ambulance]: Secondary | ICD-10-CM | POA: Diagnosis not present

## 2017-01-27 DIAGNOSIS — N179 Acute kidney failure, unspecified: Secondary | ICD-10-CM | POA: Diagnosis not present

## 2017-01-27 DIAGNOSIS — R4182 Altered mental status, unspecified: Secondary | ICD-10-CM | POA: Diagnosis not present

## 2017-01-27 DIAGNOSIS — T380X5A Adverse effect of glucocorticoids and synthetic analogues, initial encounter: Secondary | ICD-10-CM | POA: Diagnosis present

## 2017-01-27 DIAGNOSIS — I712 Thoracic aortic aneurysm, without rupture: Secondary | ICD-10-CM | POA: Diagnosis present

## 2017-01-27 HISTORY — DX: Thoracic aortic aneurysm, without rupture: I71.2

## 2017-01-27 HISTORY — DX: Aneurysm of the ascending aorta, without rupture: I71.21

## 2017-01-27 HISTORY — DX: Gout, unspecified: M10.9

## 2017-01-27 HISTORY — DX: Alcohol dependence, uncomplicated: F10.20

## 2017-01-27 LAB — RAPID URINE DRUG SCREEN, HOSP PERFORMED
Amphetamines: NOT DETECTED
Barbiturates: NOT DETECTED
Benzodiazepines: NOT DETECTED
Cocaine: NOT DETECTED
Opiates: NOT DETECTED
Tetrahydrocannabinol: NOT DETECTED

## 2017-01-27 LAB — CBC WITH DIFFERENTIAL/PLATELET
Basophils Absolute: 0 10*3/uL (ref 0.0–0.1)
Basophils Relative: 0 %
EOS PCT: 0 %
Eosinophils Absolute: 0 10*3/uL (ref 0.0–0.7)
HEMATOCRIT: 31.9 % — AB (ref 39.0–52.0)
Hemoglobin: 11.2 g/dL — ABNORMAL LOW (ref 13.0–17.0)
LYMPHS ABS: 0.4 10*3/uL — AB (ref 0.7–4.0)
LYMPHS PCT: 6 %
MCH: 34.5 pg — AB (ref 26.0–34.0)
MCHC: 35.1 g/dL (ref 30.0–36.0)
MCV: 98.2 fL (ref 78.0–100.0)
MONO ABS: 1.3 10*3/uL — AB (ref 0.1–1.0)
MONOS PCT: 16 %
NEUTROS ABS: 6.4 10*3/uL (ref 1.7–7.7)
Neutrophils Relative %: 78 %
PLATELETS: 174 10*3/uL (ref 150–400)
RBC: 3.25 MIL/uL — ABNORMAL LOW (ref 4.22–5.81)
RDW: 14 % (ref 11.5–15.5)
WBC: 8.1 10*3/uL (ref 4.0–10.5)

## 2017-01-27 LAB — URINALYSIS, COMPLETE (UACMP) WITH MICROSCOPIC
Bilirubin Urine: NEGATIVE
Glucose, UA: 50 mg/dL — AB
HGB URINE DIPSTICK: NEGATIVE
Ketones, ur: NEGATIVE mg/dL
Leukocytes, UA: NEGATIVE
Nitrite: NEGATIVE
PROTEIN: NEGATIVE mg/dL
SPECIFIC GRAVITY, URINE: 1.013 (ref 1.005–1.030)
pH: 5 (ref 5.0–8.0)

## 2017-01-27 LAB — COMPREHENSIVE METABOLIC PANEL
ALT: 34 U/L (ref 17–63)
AST: 56 U/L — ABNORMAL HIGH (ref 15–41)
Albumin: 3.9 g/dL (ref 3.5–5.0)
Alkaline Phosphatase: 74 U/L (ref 38–126)
Anion gap: 12 (ref 5–15)
BUN: 18 mg/dL (ref 6–20)
CO2: 19 mmol/L — ABNORMAL LOW (ref 22–32)
Calcium: 9.4 mg/dL (ref 8.9–10.3)
Chloride: 92 mmol/L — ABNORMAL LOW (ref 101–111)
Creatinine, Ser: 2.07 mg/dL — ABNORMAL HIGH (ref 0.61–1.24)
GFR calc Af Amer: 34 mL/min — ABNORMAL LOW (ref >60)
GFR calc non Af Amer: 30 mL/min — ABNORMAL LOW (ref >60)
Glucose, Bld: 161 mg/dL — ABNORMAL HIGH (ref 65–99)
Potassium: 4 mmol/L (ref 3.5–5.1)
Sodium: 123 mmol/L — ABNORMAL LOW (ref 135–145)
Total Bilirubin: 1.5 mg/dL — ABNORMAL HIGH (ref 0.3–1.2)
Total Protein: 7.1 g/dL (ref 6.5–8.1)

## 2017-01-27 LAB — I-STAT CG4 LACTIC ACID, ED
LACTIC ACID, VENOUS: 2.56 mmol/L — AB (ref 0.5–1.9)
LACTIC ACID, VENOUS: 2.49 mmol/L — AB (ref 0.5–1.9)

## 2017-01-27 LAB — AMMONIA: Ammonia: 37 umol/L — ABNORMAL HIGH (ref 9–35)

## 2017-01-27 LAB — CBG MONITORING, ED: Glucose-Capillary: 175 mg/dL — ABNORMAL HIGH (ref 65–99)

## 2017-01-27 LAB — ETHANOL: Alcohol, Ethyl (B): <10 mg/dL (ref <10)

## 2017-01-27 LAB — SALICYLATE LEVEL

## 2017-01-27 LAB — ACETAMINOPHEN LEVEL: Acetaminophen (Tylenol), Serum: <10 ug/mL — ABNORMAL LOW (ref 10–30)

## 2017-01-27 MED ORDER — ENOXAPARIN SODIUM 30 MG/0.3ML ~~LOC~~ SOLN
30.0000 mg | SUBCUTANEOUS | Status: DC
  Filled 2017-01-27: qty 0.3

## 2017-01-27 MED ORDER — VITAMIN B-1 100 MG PO TABS
100.0000 mg | ORAL_TABLET | Freq: Every day | ORAL | Status: DC
  Administered 2017-01-28 – 2017-01-29 (×2): 100 mg via ORAL
  Filled 2017-01-27 (×2): qty 1

## 2017-01-27 MED ORDER — LIDOCAINE HCL 2 % EX GEL
1.0000 "application " | Freq: Once | CUTANEOUS | Status: AC
  Administered 2017-01-27: 1 via TOPICAL
  Filled 2017-01-27: qty 20

## 2017-01-27 MED ORDER — FOLIC ACID 1 MG PO TABS
1.0000 mg | ORAL_TABLET | Freq: Every day | ORAL | Status: DC
  Administered 2017-01-28 – 2017-01-29 (×2): 1 mg via ORAL
  Filled 2017-01-27 (×2): qty 1

## 2017-01-27 MED ORDER — SODIUM CHLORIDE 0.9 % IV SOLN
INTRAVENOUS | Status: DC
  Administered 2017-01-27 – 2017-01-28 (×3): via INTRAVENOUS

## 2017-01-27 MED ORDER — LORAZEPAM 2 MG/ML IJ SOLN
1.0000 mg | Freq: Four times a day (QID) | INTRAMUSCULAR | Status: DC | PRN
  Administered 2017-01-27: 1 mg via INTRAVENOUS
  Filled 2017-01-27: qty 1

## 2017-01-27 MED ORDER — ALLOPURINOL 100 MG PO TABS
100.0000 mg | ORAL_TABLET | Freq: Every day | ORAL | Status: DC
  Administered 2017-01-28 – 2017-01-29 (×2): 100 mg via ORAL
  Filled 2017-01-27 (×2): qty 1

## 2017-01-27 MED ORDER — THIAMINE HCL 100 MG/ML IJ SOLN
Freq: Once | INTRAVENOUS | Status: AC
  Administered 2017-01-27: 22:00:00 via INTRAVENOUS
  Filled 2017-01-27: qty 1000

## 2017-01-27 NOTE — ED Notes (Signed)
Attempted in and out cath with assistance, attempted multiple times patient grabbing RN unsafe to proceed. Physician aware of patient being combative

## 2017-01-27 NOTE — ED Triage Notes (Signed)
Per ems pt from home, pt wife noticed pt was agitated, normally walks and talks, wrong a+ox0, 0 stroke scale, new medication prednisone for rash started 1 week prior, no blood thinners or psych meds.

## 2017-01-27 NOTE — ED Provider Notes (Signed)
[facility]-EMERGENCY DEPT Provider Note   CSN: [id number] Arrival date & time: 01/27/17  1536     History   Chief Complaint Chief Complaint  Patient presents with  . Aggressive Behavior    HPI [name] is a 76 y.o. male.  76 year old male history of ascending aortic aneurysm, mitral insufficiency, HTN, gout who presents with significant other for altered mental status.  Spouse states last known normal was last evening just before going to bed at 0100 this AM.  Spouse noted patient was behaving unusually today.  He was standing outside in the garage appearing confused.  Patient's inappropriately answers questions including stating his name is "[name]" (where he previously lived) and that his spouse was his "sister."  Spouse denies any previous episodes of the symptoms.  Spouse is concerned that patient has had multiple medication changes and is followed by [facility]. He recently had a diffuse body rash that was treated with IM kenalog and started on a prednisone taper. Pt gives himself own medications. Wife unsure if pt received additional medications this AM in confused state. He denies pain or other symptoms.  The history is provided by the spouse and medical records. No language interpreter was used.    Past Medical History:  Diagnosis Date  . Abnormal EKG   . Alcoholism (HCC)   . Ascending aortic aneurysm (HCC)   . Edema of both legs   . Gout   . History of palpitations   . Hypertension   . Personal history of gout     Patient Active Problem List   Diagnosis Date Noted  . Acute encephalopathy 01/27/2017  . Chronic hyponatremia 01/27/2017  . AKI (acute kidney injury) (HCC) 01/27/2017  . Alcoholism (HCC) 01/27/2017  . Ascending aortic aneurysm (HCC) 11/29/2016  . Bradycardia 11/29/2016  . Moderate mitral insufficiency 11/29/2016  . Edema of both legs   . Hypertension   . Personal history of gout   . History of palpitations   . Abnormal EKG 11/01/2016  . Benign  essential hypertension 10/10/2016    Past Surgical History:  Procedure Laterality Date  . incision of left neck         Home Medications    Prior to Admission medications   Medication Sig Start Date End Date Taking? Authorizing Provider  allopurinol (ZYLOPRIM) 300 MG tablet Take 300 mg by mouth daily.    Yes [provider]  aspirin EC 81 MG tablet Take 81 mg by mouth daily.   Yes [provider]  chlorthalidone (HYGROTON) 25 MG tablet Take 25 mg by mouth daily.  11/29/16 11/29/17 Yes [provider]  colchicine 0.6 MG tablet Take 0.6 mg by mouth 2 (two) times daily as needed (gout flares).    Yes [provider]  cyanocobalamin 500 MCG tablet Take 500 mcg by mouth daily. Vitamin B12   Yes [provider]  diphenhydrAMINE (BENADRYL) 25 MG tablet Take 25 mg by mouth at bedtime as needed for sleep.   Yes [provider]  Ginseng 100 MG CAPS Take 100 mg by mouth daily.   Yes [provider]  ketoconazole (NIZORAL) 2 % cream Apply 1 application topically 2 (two) times daily as needed (rash).    Yes [provider]  lisinopril (PRINIVIL,ZESTRIL) 30 MG tablet Take 30 mg by mouth daily.  08/26/16  Yes [provider]  metoprolol tartrate (LOPRESSOR) 50 MG tablet Take 25 mg by mouth daily.  06/27/16  Yes [provider]  naproxen (NAPROSYN)  500 MG tablet Take 500 mg by mouth 2 (two) times daily as needed (pain).    Yes [provider]  OVER THE COUNTER MEDICATION Take 1 tablet by mouth daily. Super Glucosamine 2000 plus - glucosamine, MSM, Boron, Silica and Hyaluronic Acid, Vitamin C, Manganese, Sodium   Yes [provider]  tiZANidine (ZANAFLEX) 4 MG tablet Take 4 mg by mouth daily as needed for muscle spasms.    Yes [provider]    Family History Family History  Problem Relation Age of Onset  . Lung cancer Father     Social History Social History  Substance Use Topics  .  Smoking status: Never Smoker  . Smokeless tobacco: Never Used  . Alcohol use No     Allergies   No known allergies   Review of Systems Review of Systems  Unable to perform ROS: Mental status change     Physical Exam Updated Vital Signs BP (!) 153/79 (BP Location: Left Arm)   Pulse 78   Temp 97.6 F (36.4 C) (Oral)   Resp 16   Ht 6' (1.829 m)   Wt 97.7 kg (215 lb 6.2 oz)   SpO2 100%   BMI 29.21 kg/m   Physical Exam  Constitutional: He appears well-developed.  HENT:  Head: Normocephalic and atraumatic.  Eyes: Conjunctivae are normal.  Neck: Neck supple.  Cardiovascular: Normal rate and regular rhythm.   No murmur heard. Pulmonary/Chest: Effort normal and breath sounds normal. No respiratory distress.  Abdominal: Soft. There is no tenderness.  Musculoskeletal: He exhibits no edema.  Neurological: He is alert. No cranial nerve deficit. Coordination normal.  Dysphasia on exam. Incorrect answers to own name and relation to wife. 5/5 motor strength and intact sensation in all extremities.  Skin: Skin is warm and dry.  Nursing note and vitals reviewed.    ED Treatments / Results  Labs (all labs ordered are listed, but only abnormal results are displayed) Labs Reviewed  COMPREHENSIVE METABOLIC PANEL - Abnormal; Notable for the following:       Result Value   Sodium 123 (*)    Chloride 92 (*)    CO2 19 (*)    Glucose, Bld 161 (*)    Creatinine, Ser 2.07 (*)    AST 56 (*)    Total Bilirubin 1.5 (*)    GFR calc non Af Amer 30 (*)    GFR calc Af Amer 34 (*)    All other components within normal limits  CBC WITH DIFFERENTIAL/PLATELET - Abnormal; Notable for the following:    RBC 3.25 (*)    Hemoglobin 11.2 (*)    HCT 31.9 (*)    MCH 34.5 (*)    Lymphs Abs 0.4 (*)    Monocytes Absolute 1.3 (*)    All other components within normal limits  URINALYSIS, COMPLETE (UACMP) WITH MICROSCOPIC - Abnormal; Notable for the following:    Glucose, UA 50 (*)    Bacteria, UA  RARE (*)    Squamous Epithelial / LPF 0-5 (*)    All other components within normal limits  AMMONIA - Abnormal; Notable for the following:    Ammonia 37 (*)    All other components within normal limits  ACETAMINOPHEN LEVEL - Abnormal; Notable for the following:    Acetaminophen (Tylenol), Serum <10 (*)    All other components within normal limits  CBG MONITORING, ED - Abnormal; Notable for the following:    Glucose-Capillary 175 (*)    All other components  within normal limits  I-STAT CG4 LACTIC ACID, ED - Abnormal; Notable for the following:    Lactic Acid, Venous 2.56 (*)    All other components within normal limits  I-STAT CG4 LACTIC ACID, ED - Abnormal; Notable for the following:    Lactic Acid, Venous 2.49 (*)    All other components within normal limits  MRSA PCR SCREENING  ETHANOL  RAPID URINE DRUG SCREEN, HOSP PERFORMED  SALICYLATE LEVEL  CBC  CREATININE, SERUM  BASIC METABOLIC PANEL  TSH  OSMOLALITY, URINE  SODIUM, URINE, RANDOM    EKG  EKG Interpretation  Date/Time:  Friday January 27 2017 16:16:19 EDT Ventricular Rate:  85 PR Interval:    QRS Duration: 104 QT Interval:  388 QTC Calculation: 462 R Axis:   -4 Text Interpretation:  Sinus rhythm Low voltage, precordial leads Probable anteroseptal infarct, old No significant change since last tracing Confirmed by Melene Plan [phone]) on 01/27/2017 5:15:48 PM       Radiology Dg Chest 2 View  Result Date: 01/27/2017 CLINICAL DATA:  Altered mental status. EXAM: CHEST  2 VIEW COMPARISON:  Chest x-ray dated November 30, 2007. FINDINGS: The cardiomediastinal silhouette is normal in size. Normal pulmonary vascularity. No focal consolidation, pleural effusion, or pneumothorax. No acute osseous abnormality. IMPRESSION: No active cardiopulmonary disease. Electronically Signed   By: Obie Dredge M.D.   On: 01/27/2017 18:19   Ct Head Wo Contrast  Result Date: 01/27/2017 CLINICAL DATA:  Altered level of consciousness EXAM:  CT HEAD WITHOUT CONTRAST TECHNIQUE: Contiguous axial images were obtained from the base of the skull through the vertex without intravenous contrast. COMPARISON:  None. FINDINGS: Brain: Motion degraded study. No gross hemorrhage or intracranial mass is visualized. Moderate atrophy. Mild small vessel ischemic changes of the white matter. Prominent ventricles likely due to atrophy. Vascular: No hyperdense vessels. Scattered calcifications at the carotid siphons Skull: No obvious fracture Sinuses/Orbits: Mild mucosal thickening in the ethmoid sinuses and maxillary sinuses. Mucous retention cysts in the left sphenoid sinus. No definite acute orbital abnormality. Bilateral lens extraction Other: None IMPRESSION: Motion degraded study. No gross acute intracranial abnormality. Atrophy and small vessel ischemic changes of the white matter. Electronically Signed   By: Jasmine Pang M.D.   On: 01/27/2017 17:04    Procedures Procedures (including critical care time)  Medications Ordered in ED Medications  allopurinol (ZYLOPRIM) tablet 100 mg (not administered)  enoxaparin (LOVENOX) injection 30 mg (not administered)  thiamine (VITAMIN B-1) tablet 100 mg (not administered)  folic acid (FOLVITE) tablet 1 mg (not administered)  0.9 %  sodium chloride infusion ( Intravenous New Bag/Given 01/27/17 2329)  LORazepam (ATIVAN) injection 1 mg (1 mg Intravenous Given 01/27/17 2227)  lidocaine (XYLOCAINE) 2 % jelly 1 application (1 application Topical Given 01/27/17 1847)  sodium chloride 0.9 % 1,000 mL with thiamine 100 mg, folic acid 1 mg, multivitamins adult 10 mL infusion ( Intravenous New Bag/Given 01/27/17 2228)     Initial Impression / Assessment and Plan / ED Course  I have reviewed the triage vital signs and the nursing notes.  Pertinent labs & imaging results that were available during my care of the patient were reviewed by me and considered in my medical decision making (see chart for details).     82 yoM  h/o ascending aortic aneurysm, mitral insufficiency, HTN, gout who presents with acute encephalopathy. Dysphasia at bedside, providing incorrect verbal answers to several questions including own name and relation to wife. No other focal neuro deficits. Currently  on steroid taper after recent diffuse rash. Chronic heavy EtOH use, no evidence of active withdrawal. AF, VSS. Mildly aggressively while attempting to establish IV.  Labs showing lactic acid 2.56 and sodium 123. CT Head degraded by motion. Unable to obtain MRI due to intermittent aggressiveness and difficulty with pt following instructions.  Unclear etiology for encephalopathy. Question prednisone psychosis vs hyponatremia vs neurologic etiology. Pt admitted for further management and evaluation.  Pt care d/w Dr. Adela Lank  Final Clinical Impressions(s) / ED Diagnoses   Final diagnoses:  Acute encephalopathy  Dysphasia    New Prescriptions Current Discharge Medication List       Hebert Soho, MD 01/28/17 1333    Melene Plan, DO 01/29/17 1444

## 2017-01-27 NOTE — ED Notes (Signed)
Patient attempting to leave pt pushing tech. Attempting to verbally deescalate and get patient back in bad. Admitting provider notified

## 2017-01-27 NOTE — H&P (Addendum)
History and Physical    [name] ZOX:[id number] DOB: Nov 24, 1940 DOA: 01/27/2017  PCP: [facility] in [place]  Patient coming from:    Chief Complaint: altered mental status  HPI: [name] is a 76 y.o. male with medical history significant for see below presenting for ams. This began this morning. First time this has happened. Has been treated recently for wound secondary to gout treatment. Started on steroid taper on 10/1 for rash. Today has been "mixed up." not sure where he is, acting confused. No cough, nothing much to eat or drink today. Hasn't urinated. Alcoholic, 4 large beers daily. No drugs. No history of alcohol withdrawal but has not been without beer in the 12 years he has been married. No falls that wife is aware of.   ED Course: labs, CT, CXR  Review of Systems: As per HPI otherwise 10 point review of systems negative.    Past Medical History:  Diagnosis Date  . Abnormal EKG   . Alcoholism (HCC)   . Ascending aortic aneurysm (HCC)   . Edema of both legs   . Gout   . History of palpitations   . Hypertension   . Personal history of gout     Past Surgical History:  Procedure Laterality Date  . incision of left neck       reports that he has never smoked. He has never used smokeless tobacco. He reports that he does not drink alcohol or use drugs.  Allergies  Allergen Reactions  . No Known Allergies     Family History  Problem Relation Age of Onset  . Lung cancer Father      Prior to Admission medications   Medication Sig Start Date End Date Taking? Authorizing Provider  allopurinol (ZYLOPRIM) 300 MG tablet Take 300 mg by mouth daily.    Yes [provider]  aspirin EC 81 MG tablet Take 81 mg by mouth daily.   Yes [provider]  chlorthalidone (HYGROTON) 25 MG tablet Take 25 mg by mouth daily.  11/29/16 11/29/17 Yes [provider]  colchicine 0.6 MG tablet Take 0.6 mg by mouth 2 (two) times daily as needed (gout  flares).    Yes [provider]  cyanocobalamin 500 MCG tablet Take 500 mcg by mouth daily. Vitamin B12   Yes [provider]  diphenhydrAMINE (BENADRYL) 25 MG tablet Take 25 mg by mouth at bedtime as needed for sleep.   Yes [provider]  Ginseng 100 MG CAPS Take 100 mg by mouth daily.   Yes [provider]  ketoconazole (NIZORAL) 2 % cream Apply 1 application topically 2 (two) times daily as needed (rash).    Yes [provider]  lisinopril (PRINIVIL,ZESTRIL) 30 MG tablet Take 30 mg by mouth daily.  08/26/16  Yes [provider]  metoprolol tartrate (LOPRESSOR) 50 MG tablet Take 25 mg by mouth daily.  06/27/16  Yes [provider]  naproxen (NAPROSYN) 500 MG tablet Take 500 mg by mouth 2 (two) times daily as needed (pain).    Yes [provider]  OVER THE COUNTER MEDICATION Take 1 tablet by mouth daily. Super Glucosamine 2000 plus - glucosamine, MSM, Boron, Silica and Hyaluronic Acid, Vitamin C, Manganese, Sodium   Yes [provider]  predniSONE (DELTASONE) 10 MG tablet Take 10-30 mg by mouth See admin instructions. Tapered course picked up 01/23/17 pm - take 3 tablets (30 mg) by mouth daily for 3 days, then take 2 tablets (20 mg)  daily for 3 days, then take 1 tablet (10 mg) daily for 3 days, then stop   Yes [provider]  tiZANidine (ZANAFLEX) 4 MG tablet Take 4 mg by mouth daily as needed for muscle spasms.    Yes [provider]    Physical Exam: Vitals:   01/27/17 1615 01/27/17 1715 01/27/17 1730 01/27/17 2100  BP: (!) 150/72 (!) 158/73 (!) 132/98   Pulse:  87 95 87  Resp: (!) Temp:      TempSrc:      SpO2:  100% 100% 99%  Weight:      Height:        Constitutional: NAD, calm, comfortable Vitals:   01/27/17 1615 01/27/17 1715 01/27/17 1730 01/27/17 2100  BP: (!) 150/72 (!) 158/73 (!) 132/98   Pulse:  87 95 87  Resp: (!) Temp:      TempSrc:      SpO2:   100% 100% 99%  Weight:      Height:       Eyes: PERRL, lids and conjunctivae normal ENMT: Mucous membranes are moist.  Neck: normal, supple, no masses, no thyromegaly Respiratory: clear to auscultation bilaterally, no wheezing, no crackles. Normal respiratory effort. No accessory muscle use.  Cardiovascular: Regular rate and rhythm, no murmurs / rubs / gallops. No extremity edema. 2+ pedal pulses.  Abdomen: no tenderness, no masses palpated. No hepatosplenomegaly. Bowel sounds positive.  Musculoskeletal: no clubbing / cyanosis.  Skin: callous left heel, healing ulcer left lateral malleolus Neurologic: moving all extremities. Alert.  Psychiatric: Oriented to city [place]). Not to place or time. Oriented to self.   Labs on Admission: I have personally reviewed following labs and imaging studies  CBC:  Recent Labs Lab 01/27/17 1611  WBC 8.1  NEUTROABS 6.4  HGB 11.2*  HCT 31.9*  MCV 98.2  PLT 174   Basic Metabolic Panel:  Recent Labs Lab 01/27/17 1611  NA 123*  K 4.0  CL 92*  CO2 19*  GLUCOSE 161*  BUN 18  CREATININE 2.07*  CALCIUM 9.4   GFR: Estimated Creatinine Clearance: 35.1 mL/min (A) (by C-G formula based on SCr of 2.07 mg/dL (H)). Liver Function Tests:  Recent Labs Lab 01/27/17 1611  AST 56*  ALT 34  ALKPHOS 74  BILITOT 1.5*  PROT 7.1  ALBUMIN 3.9   No results for input(s): LIPASE, AMYLASE in the last 168 hours.  Recent Labs Lab 01/27/17 1939  AMMONIA 37*   Coagulation Profile: No results for input(s): INR, PROTIME in the last 168 hours. Cardiac Enzymes: No results for input(s): CKTOTAL, CKMB, CKMBINDEX, TROPONINI in the last 168 hours. BNP (last 3 results) No results for input(s): PROBNP in the last 8760 hours. HbA1C: No results for input(s): HGBA1C in the last 72 hours. CBG:  Recent Labs Lab 01/27/17 1628  GLUCAP 175*   Lipid Profile: No results for input(s): CHOL, HDL, LDLCALC, TRIG, CHOLHDL, LDLDIRECT in the last 72  hours. Thyroid Function Tests: No results for input(s): TSH, T4TOTAL, FREET4, T3FREE, THYROIDAB in the last 72 hours. Anemia Panel: No results for input(s): VITAMINB12, FOLATE, FERRITIN, TIBC, IRON, RETICCTPCT in the last 72 hours. Urine analysis: No results found for: COLORURINE, APPEARANCEUR, LABSPEC, PHURINE, GLUCOSEU, HGBUR, BILIRUBINUR, KETONESUR, PROTEINUR, UROBILINOGEN, NITRITE, LEUKOCYTESUR  Radiological Exams on Admission: Dg Chest 2 View  Result Date: 01/27/2017 CLINICAL DATA:  Altered mental status. EXAM: CHEST  2 VIEW COMPARISON:  Chest x-ray dated November 30, 2007. FINDINGS:  The cardiomediastinal silhouette is normal in size. Normal pulmonary vascularity. No focal consolidation, pleural effusion, or pneumothorax. No acute osseous abnormality. IMPRESSION: No active cardiopulmonary disease. Electronically Signed   By: Obie Dredge M.D.   On: 01/27/2017 18:19   Ct Head Wo Contrast  Result Date: 01/27/2017 CLINICAL DATA:  Altered level of consciousness EXAM: CT HEAD WITHOUT CONTRAST TECHNIQUE: Contiguous axial images were obtained from the base of the skull through the vertex without intravenous contrast. COMPARISON:  None. FINDINGS: Brain: Motion degraded study. No gross hemorrhage or intracranial mass is visualized. Moderate atrophy. Mild small vessel ischemic changes of the white matter. Prominent ventricles likely due to atrophy. Vascular: No hyperdense vessels. Scattered calcifications at the carotid siphons Skull: No obvious fracture Sinuses/Orbits: Mild mucosal thickening in the ethmoid sinuses and maxillary sinuses. Mucous retention cysts in the left sphenoid sinus. No definite acute orbital abnormality. Bilateral lens extraction Other: None IMPRESSION: Motion degraded study. No gross acute intracranial abnormality. Atrophy and small vessel ischemic changes of the white matter. Electronically Signed   By: Jasmine Pang M.D.   On: 01/27/2017 17:04    EKG: Independently reviewed.  Nsr, normal intervals, no ischemic changes  Assessment/Plan Active Problems:   Hypertension   Benign essential hypertension   Acute encephalopathy   Chronic hyponatremia   AKI (acute kidney injury) (HCC)   Alcoholism (HCC)     39m here with acute encephalopathy. I think this is likely iatrogenic, secondary to recent corticosteroid use.  # Acute encephalopathy - CT head negative. Ammonia, ethanol, and tylenol levels normal. uds pending. Does have AKI but do not think severe enough to be main cause of AMS. No chest pain and EKG non-ischemic. No infectious signs/symptoms, though urinalysis pending. - discontinue prednisone (has only been taking for 4 days, so no need to taper) - IV hydration - monitor - f/u UDS - f/u urinalysis, cath if necessary  # Alcohol abuse - many years of daily drinking equivalent of 8 or so 12 ounce beers daily. Denies hx of withdrawal, but per wife has likely never been without alcohol for an extended period. Does not appear to be in withdrawal/DTs. No tachycardia, no fever, no tremor, not agitated, does not appear to be hallucinating.  Given acute encephalopathy and no symptoms of withdrawal am holding on benzodiazepine - CIWI, start benzo therapy if indicated - banana bag given  # hyponatremia - suspect chronic as see hx of hyponatremia to 120s dating as far back as June of this year. Current sodium corrects to 124. Given aki will not fluid restrict. Likely beer potomania. - gentle hydration with NS 0.9% @ 125. Banana bag, daily thiamine/folate - urine lytes  # acute kidney injury - from baseline creatine 0.9, today 2.07. Suspect prerenal 2/2 dehydration as is altered and wife says hasn't had much of anything to eat or drink today - hydration as above, bmp in am  # Hypertension - bp currently wnl - hold home antihypertensives given AKI 2/2 dehydration. Definitely holding chlorthalidone 2/2 hyponatremia. Re-start others as able  #gout  - decrease home  allopurinol to 100 qd given aki, would resume 300 qd when kidney function normalizes  DVT prophylaxis: lovenox 30, incrase to 40 if gfr increases Code Status: full code Family Communication: wife Lequita Asal [phone] or [phone] Disposition Plan: hopeful for home Consults called: none Admission status: SDU   Silvano Bilis MD [facility] Pager [phone]  If 7PM-7AM, please contact night-coverage [email or web address] Password [email or web address]  01/27/2017, 9:24 PM

## 2017-01-27 NOTE — ED Notes (Addendum)
Disregard note,   Charted in error.

## 2017-01-27 NOTE — ED Notes (Signed)
Patient attempting to provide urine sample  

## 2017-01-28 LAB — CBC
HEMATOCRIT: 30.8 % — AB (ref 39.0–52.0)
HEMOGLOBIN: 11 g/dL — AB (ref 13.0–17.0)
MCH: 35.3 pg — AB (ref 26.0–34.0)
MCHC: 35.7 g/dL (ref 30.0–36.0)
MCV: 98.7 fL (ref 78.0–100.0)
PLATELETS: 165 10*3/uL (ref 150–400)
RBC: 3.12 MIL/uL — AB (ref 4.22–5.81)
RDW: 13.8 % (ref 11.5–15.5)
WBC: 6.5 10*3/uL (ref 4.0–10.5)

## 2017-01-28 LAB — BASIC METABOLIC PANEL
ANION GAP: 8 (ref 5–15)
ANION GAP: 10 (ref 5–15)
BUN: 20 mg/dL (ref 6–20)
BUN: 13 mg/dL (ref 6–20)
CALCIUM: 9 mg/dL (ref 8.9–10.3)
CHLORIDE: 95 mmol/L — AB (ref 101–111)
CO2: 23 mmol/L (ref 22–32)
CO2: 23 mmol/L (ref 22–32)
CREATININE: 1.24 mg/dL (ref 0.61–1.24)
CREATININE: 1.77 mg/dL — AB (ref 0.61–1.24)
Calcium: 8.6 mg/dL — ABNORMAL LOW (ref 8.9–10.3)
Chloride: 91 mmol/L — ABNORMAL LOW (ref 101–111)
GFR calc non Af Amer: 55 mL/min — ABNORMAL LOW (ref >60)
GFR, EST AFRICAN AMERICAN: 42 mL/min — AB (ref >60)
GFR, EST NON AFRICAN AMERICAN: 36 mL/min — AB (ref >60)
Glucose, Bld: 132 mg/dL — ABNORMAL HIGH (ref 65–99)
Glucose, Bld: 103 mg/dL — ABNORMAL HIGH (ref 65–99)
Potassium: 4 mmol/L (ref 3.5–5.1)
Potassium: 3.8 mmol/L (ref 3.5–5.1)
SODIUM: 126 mmol/L — AB (ref 135–145)
SODIUM: 124 mmol/L — AB (ref 135–145)

## 2017-01-28 LAB — MRSA PCR SCREENING: MRSA by PCR: NEGATIVE

## 2017-01-28 LAB — OSMOLALITY, URINE: Osmolality, Ur: 371 mOsm/kg (ref 300–900)

## 2017-01-28 LAB — TSH: TSH: 1.448 u[IU]/mL (ref 0.350–4.500)

## 2017-01-28 LAB — SODIUM, URINE, RANDOM: Sodium, Ur: 35 mmol/L

## 2017-01-28 MED ORDER — TIZANIDINE HCL 4 MG PO TABS
4.0000 mg | ORAL_TABLET | Freq: Once | ORAL | Status: AC
  Administered 2017-01-28: 4 mg via ORAL
  Filled 2017-01-28: qty 1

## 2017-01-28 MED ORDER — ENOXAPARIN SODIUM 40 MG/0.4ML ~~LOC~~ SOLN
40.0000 mg | SUBCUTANEOUS | Status: DC
  Administered 2017-01-28: 40 mg via SUBCUTANEOUS
  Filled 2017-01-28: qty 0.4

## 2017-01-28 MED ORDER — ADULT MULTIVITAMIN W/MINERALS CH
1.0000 | ORAL_TABLET | Freq: Every day | ORAL | Status: DC
  Administered 2017-01-28 – 2017-01-29 (×2): 1 via ORAL
  Filled 2017-01-28 (×2): qty 1

## 2017-01-28 MED ORDER — ENOXAPARIN SODIUM 40 MG/0.4ML ~~LOC~~ SOLN: 40.0000 mg | SUBCUTANEOUS | Status: DC

## 2017-01-28 MED ORDER — DIPHENHYDRAMINE HCL 25 MG PO CAPS
25.0000 mg | ORAL_CAPSULE | Freq: Every evening | ORAL | Status: DC | PRN
  Administered 2017-01-28 (×2): 25 mg via ORAL
  Filled 2017-01-28 (×2): qty 1

## 2017-01-28 NOTE — Progress Notes (Signed)
PROGRESS NOTE    [name]  EAV:[id number] DOB: 12-Jun-1940 DOA: 01/27/2017 PCP: Georgann Housekeeper, MD   Chief Complaint  Patient presents with  . Aggressive Behavior    Brief Narrative:  HPI On 01/27/2017 by Dr. Shonna Chock [name] is a 76 y.o. male with medical history significant for see below presenting for ams. This began this morning. First time this has happened. Has been treated recently for wound secondary to gout treatment. Started on steroid taper on 10/1 for rash. Today has been "mixed up." not sure where he is, acting confused. No cough, nothing much to eat or drink today. Hasn't urinated. Alcoholic, 4 large beers daily. No drugs. No history of alcohol withdrawal but has not been without beer in the 12 years he has been married. No falls that wife is aware of.  Assessment & Plan   Acute encephalopathy, possible metabolic -mental status appears to be improving  -CT head: No gross acute intracranial normality  -CXR and UA unremarkable for infection -Ammonia 37, TSH 1.448 -Patient was recently placed on prednisone, may have had acute encephalopathy secondary to steroids -UDS unremarkable, alcohol level less than 10  Acute kidney injury  -Upon review of [facility], last creatinine was 0.9 earlier this year -upon admission, Creatinine 2.07 -placed on IVF, creatinine improved to 1.77 -Continue to monitor BMP  Hyponatremia -possibly chronic, ?secondary to alcohol abuse -patient's sodium level was 127 in June 2018 (per [facility]) -continue IVF, monitor BMP  Alcohol abuse -Alcohol level on admission was 10 however patient endorses drinking 8, 12 ounce beers daily -Currently does not appear to be in withdrawal -Continue CIWA, multivitamin, thiamine, folate  Essential hypertension -hold chlorthalidone, metoprolol, lisinopril  -BP appears to be stable   Gout  -dose of allopurinol decreased  DVT Prophylaxis  Lovenox  Code Status: Full  Family Communication:  None at bedside  Disposition Plan: Admitted, continue to monitor   Consultants None  Procedures  None  Antibiotics   Anti-infectives    None      Subjective:   [name] seen and examined today. Denies chest pain, shortness of breath, abdominal pain, N/V/D/C.    Objective:   Vitals:   01/27/17 2245 01/27/17 2320 01/28/17 0428 01/28/17 0800  BP: 137/69 (!) 153/79 137/63 132/63  Pulse: 82 78 72 75  Resp:  Temp:  97.6 F (36.4 C) 98 F (36.7 C) 98.1 F (36.7 C)  TempSrc:  Oral Oral Oral  SpO2: 100% 100% 100% 97%  Weight:  97.7 kg (215 lb 6.2 oz)    Height:  6' (1.829 m)      Intake/Output Summary (Last 24 hours) at 01/28/17 1122 Last data filed at 01/28/17 0900  Gross per 24 hour  Intake          1044.58 ml  Output              360 ml  Net           684.58 ml   Filed Weights   01/27/17 1542 01/27/17 2320  Weight: 95.3 kg (210 lb) 97.7 kg (215 lb 6.2 oz)    Exam  General: Well developed, well nourished, NAD, appears stated age  HEENT: NCAT, mucous membranes moist.    Cardiovascular: S1 S2 auscultated, no rubs, murmurs or gallops. Regular rate and rhythm.  Respiratory: Clear to auscultation bilaterally with equal chest rise  Abdomen: Soft, nontender, nondistended, + bowel sounds  Extremities: warm dry without cyanosis clubbing or edema. Left  lateral malleolus ulcer-healing  Neuro: AAOx3, nonfocal  Psych: Appropriate  Data Reviewed: I have personally reviewed following labs and imaging studies  CBC:  Recent Labs Lab 01/27/17 1611 01/28/17 0021  WBC 8.1 6.5  NEUTROABS 6.4  --   HGB 11.2* 11.0*  HCT 31.9* 30.8*  MCV 98.2 98.7  PLT 174 165   Basic Metabolic Panel:  Recent Labs Lab 01/27/17 1611 01/28/17 0021  NA 123* 124*  K 4.0 4.0  CL 92* 91*  CO2 19* 23  GLUCOSE 161* 132*  BUN 18 20  CREATININE 2.07* 1.77*  CALCIUM 9.4 9.0   GFR: Estimated Creatinine Clearance: 43.7 mL/min (A) (by C-G formula based on SCr of 1.77  mg/dL (H)). Liver Function Tests:  Recent Labs Lab 01/27/17 1611  AST 56*  ALT 34  ALKPHOS 74  BILITOT 1.5*  PROT 7.1  ALBUMIN 3.9   No results for input(s): LIPASE, AMYLASE in the last 168 hours.  Recent Labs Lab 01/27/17 1939  AMMONIA 37*   Coagulation Profile: No results for input(s): INR, PROTIME in the last 168 hours. Cardiac Enzymes: No results for input(s): CKTOTAL, CKMB, CKMBINDEX, TROPONINI in the last 168 hours. BNP (last 3 results) No results for input(s): PROBNP in the last 8760 hours. HbA1C: No results for input(s): HGBA1C in the last 72 hours. CBG:  Recent Labs Lab 01/27/17 1628  GLUCAP 175*   Lipid Profile: No results for input(s): CHOL, HDL, LDLCALC, TRIG, CHOLHDL, LDLDIRECT in the last 72 hours. Thyroid Function Tests:  Recent Labs  01/28/17 0021  TSH 1.448   Anemia Panel: No results for input(s): VITAMINB12, FOLATE, FERRITIN, TIBC, IRON, RETICCTPCT in the last 72 hours. Urine analysis:    Component Value Date/Time   COLORURINE YELLOW 01/27/2017 1722   APPEARANCEUR CLEAR 01/27/2017 1722   LABSPEC 1.013 01/27/2017 1722   PHURINE 5.0 01/27/2017 1722   GLUCOSEU 50 (A) 01/27/2017 1722   HGBUR NEGATIVE 01/27/2017 1722   BILIRUBINUR NEGATIVE 01/27/2017 1722   KETONESUR NEGATIVE 01/27/2017 1722   PROTEINUR NEGATIVE 01/27/2017 1722   NITRITE NEGATIVE 01/27/2017 1722   LEUKOCYTESUR NEGATIVE 01/27/2017 1722   Sepsis Labs: (procalcitonin:4,lacticidven:4)  ) Recent Results (from the past 240 hour(s))  MRSA PCR Screening     Status: None   Collection Time: 01/27/17 11:27 PM  Result Value Ref Range Status   MRSA by PCR NEGATIVE NEGATIVE Final    Comment:        The GeneXpert MRSA Assay (FDA approved for NASAL specimens only), is one component of a comprehensive MRSA colonization surveillance program. It is not intended to diagnose MRSA infection nor to guide or monitor treatment for MRSA infections.       Radiology  Studies: Dg Chest 2 View  Result Date: 01/27/2017 CLINICAL DATA:  Altered mental status. EXAM: CHEST  2 VIEW COMPARISON:  Chest x-ray dated November 30, 2007. FINDINGS: The cardiomediastinal silhouette is normal in size. Normal pulmonary vascularity. No focal consolidation, pleural effusion, or pneumothorax. No acute osseous abnormality. IMPRESSION: No active cardiopulmonary disease. Electronically Signed   By: Obie Dredge M.D.   On: 01/27/2017 18:19   Ct Head Wo Contrast  Result Date: 01/27/2017 CLINICAL DATA:  Altered level of consciousness EXAM: CT HEAD WITHOUT CONTRAST TECHNIQUE: Contiguous axial images were obtained from the base of the skull through the vertex without intravenous contrast. COMPARISON:  None. FINDINGS: Brain: Motion degraded study. No gross hemorrhage or intracranial mass is visualized. Moderate atrophy. Mild small vessel ischemic changes of the white matter. Prominent  ventricles likely due to atrophy. Vascular: No hyperdense vessels. Scattered calcifications at the carotid siphons Skull: No obvious fracture Sinuses/Orbits: Mild mucosal thickening in the ethmoid sinuses and maxillary sinuses. Mucous retention cysts in the left sphenoid sinus. No definite acute orbital abnormality. Bilateral lens extraction Other: None IMPRESSION: Motion degraded study. No gross acute intracranial abnormality. Atrophy and small vessel ischemic changes of the white matter. Electronically Signed   By: Jasmine Pang M.D.   On: 01/27/2017 17:04     Scheduled Meds: . allopurinol  100 mg Oral Daily  . enoxaparin (LOVENOX) injection  30 mg Subcutaneous Q24H  . folic acid  1 mg Oral Daily  . multivitamin with minerals  1 tablet Oral Daily  . thiamine  100 mg Oral Daily   Continuous Infusions: . sodium chloride 125 mL/hr at 01/28/17 0822     LOS: 1 day   Time Spent in minutes   30 minutes  [name] D.O. on 01/28/2017 at 11:22 AM  Between 7am to 7pm - Pager - [phone]  After 7pm  go to [email or web address] - password [email or web address]  And look for the night coverage person covering for me after hours  [facility] Office  [phone]

## 2017-01-28 NOTE — Plan of Care (Signed)
Problem: Pain Managment: Goal: General experience of comfort will improve Outcome: Progressing No c/o pain at this time.   

## 2017-01-29 LAB — BASIC METABOLIC PANEL
Anion gap: 7 (ref 5–15)
BUN: 10 mg/dL (ref 6–20)
CALCIUM: 8.5 mg/dL — AB (ref 8.9–10.3)
CHLORIDE: 97 mmol/L — AB (ref 101–111)
CO2: 23 mmol/L (ref 22–32)
Creatinine, Ser: 1.07 mg/dL (ref 0.61–1.24)
Glucose, Bld: 101 mg/dL — ABNORMAL HIGH (ref 65–99)
POTASSIUM: 3.7 mmol/L (ref 3.5–5.1)
SODIUM: 127 mmol/L — AB (ref 135–145)

## 2017-01-29 MED ORDER — THIAMINE HCL 100 MG PO TABS: 100.0000 mg | ORAL_TABLET | Freq: Every day | ORAL | 0 refills | Status: DC

## 2017-01-29 MED ORDER — ADULT MULTIVITAMIN W/MINERALS CH: 1.0000 | ORAL_TABLET | Freq: Every day | ORAL | 0 refills | Status: DC

## 2017-01-29 MED ORDER — FOLIC ACID 1 MG PO TABS: 1.0000 mg | ORAL_TABLET | Freq: Every day | ORAL | 0 refills | Status: DC

## 2017-01-29 NOTE — Care Management Note (Signed)
Case Management Note  Patient Details  Name: Colbin Jovel MRN: [id number] Date of Birth: 1940/05/26  Subjective/Objective:   No identified CM needs at discharge.                  Action/Plan:CM will sign off for now but will be available should additional discharge needs arise or disposition change.    Expected Discharge Date:  01/29/17               Expected Discharge Plan:  Home/Self Care  In-House Referral:  NA  Discharge planning Services  CM Consult  Post Acute Care Choice:  NA Choice offered to:     DME Arranged:    DME Agency:     HH Arranged:    HH Agency:     Status of Service:  Completed, signed off  If discussed at [employer] of Stay Meetings, dates discussed:    Additional Comments:  Yvone Neu, RN 01/29/2017, 9:39 AM

## 2017-01-29 NOTE — Progress Notes (Signed)
Explained and discussed discharge instructions to wife, daugther and pt. Aware to schedule appt with primary tomorrow as offfice closed today in 1 week and for bloodwork. Pt going home with wife via w/c with belongings. No complaints at this time.

## 2017-01-29 NOTE — Discharge Summary (Signed)
Physician Discharge Summary  [name] ZOX:[id number] DOB: 10-Apr-1941 DOA: 01/27/2017  PCP: Georgann Housekeeper, MD  Admit date: 01/27/2017 Discharge date: 01/29/2017  Time spent: 45 minutes  Recommendations for Outpatient Follow-up:  Patient will be discharged to home.  Patient will need to follow up with primary care provider within one week of discharge, repeat BMP.  Patient should continue medications as prescribed.  Patient should follow a heart healthy diet.   Discharge Diagnoses:  Acute encephalopathy, possible metabolic Acute kidney injury  Hyponatremia Alcohol abuse Essential hypertension Gout   Discharge Condition: Stable  Diet recommendation: Heart healthy  Filed Weights   01/27/17 1542 01/27/17 2320  Weight: 95.3 kg (210 lb) 97.7 kg (215 lb 6.2 oz)    History of present illness:  On 01/27/2017 by Dr. Bryan Lemma Hughesis a 76 y.o.malewith medical history significant for see below presenting for ams. This began this morning. First time this has happened. Has been treated recently for wound secondary to gout treatment. Started on steroid taper on 10/1 for rash. Today has been "mixed up." not sure where he is, acting confused. No cough, nothing much to eat or drink today. Hasn't urinated. Alcoholic, 4 large beers daily. No drugs. No history of alcohol withdrawal but has not been without beer in the 12 years he has been married. No falls that wife is aware of.   Hospital Course:  Acute encephalopathy, possible metabolic -Appears to have resolved, currently AAOx4 -CT head: No gross acute intracranial normality  -CXR and UA unremarkable for infection -Ammonia 37, TSH 1.448 -Patient was recently placed on prednisone, may have had acute encephalopathy secondary to steroids -UDS unremarkable, alcohol level less than 10  Acute kidney injury  -Upon review of [facility], last creatinine was 0.9 earlier this year -upon admission, Creatinine 2.07 -placed on IVF,  creatinine improved to 1.07 -Continue to monitor BMP  Hyponatremia -possibly chronic, ?secondary to alcohol abuse -Sodium up to 127 -patient's sodium level was 127 in June 2018 (per [facility]) -repeat BMP in one week  Alcohol abuse -Alcohol level on admission was < 10 however patient endorses drinking 8, 12 ounce beers daily -Currently does not appear to be in withdrawal -Continue CIWA, multivitamin, thiamine, folate -Discussed cessation  Essential hypertension -hold chlorthalidone, metoprolol, lisinopril - resume on discharge -BP appears to be stable   Gout  -dose of allopurinol decreased  Procedures: None  Consultations: None  Discharge Exam: Vitals:   01/29/17 0414 01/29/17 0824  BP:  (!) 144/71  Pulse: 64 63  Resp: 15 17  Temp: 98.6 F (37 C) 98.4 F (36.9 C)  SpO2: 100% 100%   Denies chest pain, shortness of breath, abdominal pain, N/V/D/C, dizziness, headache. Would like to go home.    General: Well developed, well nourished, NAD, appears stated age  HEENT: NCAT, mucous membranes moist.  Cardiovascular: S1 S2 auscultated, RRR, no murmurs  Respiratory: Clear to auscultation bilaterally with equal chest rise  Abdomen: Soft, nontender, nondistended, + bowel sounds  Extremities: warm dry without cyanosis clubbing or edema. Left lateral malleolus ulcer- healing   Neuro: AAOx3, nonfocal  Psych: Appropriate and pleasant  Discharge Instructions Discharge Instructions    Discharge instructions    Complete by:  As directed    Patient will be discharged to home.  Patient will need to follow up with primary care provider within one week of discharge, repeat BMP.  Patient should continue medications as prescribed.  Patient should follow a heart healthy diet.     Current Discharge  Medication List    START taking these medications   Details  folic acid (FOLVITE) 1 MG tablet Take 1 tablet (1 mg total) by mouth daily. Qty: 30 tablet, Refills: 0     Multiple Vitamin (MULTIVITAMIN WITH MINERALS) TABS tablet Take 1 tablet by mouth daily. Qty: 30 tablet, Refills: 0    thiamine 100 MG tablet Take 1 tablet (100 mg total) by mouth daily. Qty: 30 tablet, Refills: 0      CONTINUE these medications which have NOT CHANGED   Details  allopurinol (ZYLOPRIM) 300 MG tablet Take 300 mg by mouth daily.     aspirin EC 81 MG tablet Take 81 mg by mouth daily.    chlorthalidone (HYGROTON) 25 MG tablet Take 25 mg by mouth daily.     colchicine 0.6 MG tablet Take 0.6 mg by mouth 2 (two) times daily as needed (gout flares).     cyanocobalamin 500 MCG tablet Take 500 mcg by mouth daily. Vitamin B12    diphenhydrAMINE (BENADRYL) 25 MG tablet Take 25 mg by mouth at bedtime as needed for sleep.    Ginseng 100 MG CAPS Take 100 mg by mouth daily.    ketoconazole (NIZORAL) 2 % cream Apply 1 application topically 2 (two) times daily as needed (rash).     lisinopril (PRINIVIL,ZESTRIL) 30 MG tablet Take 30 mg by mouth daily.     metoprolol tartrate (LOPRESSOR) 50 MG tablet Take 25 mg by mouth daily.     naproxen (NAPROSYN) 500 MG tablet Take 500 mg by mouth 2 (two) times daily as needed (pain).     OVER THE COUNTER MEDICATION Take 1 tablet by mouth daily. Super Glucosamine 2000 plus - glucosamine, MSM, Boron, Silica and Hyaluronic Acid, Vitamin C, Manganese, Sodium    tiZANidine (ZANAFLEX) 4 MG tablet Take 4 mg by mouth daily as needed for muscle spasms.        Allergies  Allergen Reactions  . No Known Allergies    Follow-up Information    Georgann Housekeeper, MD. Schedule an appointment as soon as possible for a visit in 1 week(s).   Specialty:  Internal Medicine Why:  Hospital follow up Contact information: [address] [place] [address] [phone]            The results of significant diagnostics from this hospitalization (including imaging, microbiology, ancillary and laboratory) are listed below for reference.     Significant Diagnostic Studies: Dg Chest 2 View  Result Date: 01/27/2017 CLINICAL DATA:  Altered mental status. EXAM: CHEST  2 VIEW COMPARISON:  Chest x-ray dated November 30, 2007. FINDINGS: The cardiomediastinal silhouette is normal in size. Normal pulmonary vascularity. No focal consolidation, pleural effusion, or pneumothorax. No acute osseous abnormality. IMPRESSION: No active cardiopulmonary disease. Electronically Signed   By: Obie Dredge M.D.   On: 01/27/2017 18:19   Ct Head Wo Contrast  Result Date: 01/27/2017 CLINICAL DATA:  Altered level of consciousness EXAM: CT HEAD WITHOUT CONTRAST TECHNIQUE: Contiguous axial images were obtained from the base of the skull through the vertex without intravenous contrast. COMPARISON:  None. FINDINGS: Brain: Motion degraded study. No gross hemorrhage or intracranial mass is visualized. Moderate atrophy. Mild small vessel ischemic changes of the white matter. Prominent ventricles likely due to atrophy. Vascular: No hyperdense vessels. Scattered calcifications at the carotid siphons Skull: No obvious fracture Sinuses/Orbits: Mild mucosal thickening in the ethmoid sinuses and maxillary sinuses. Mucous retention cysts in the left sphenoid sinus. No definite acute orbital abnormality. Bilateral lens  extraction Other: None IMPRESSION: Motion degraded study. No gross acute intracranial abnormality. Atrophy and small vessel ischemic changes of the white matter. Electronically Signed   By: Jasmine Pang M.D.   On: 01/27/2017 17:04    Microbiology: Recent Results (from the past 240 hour(s))  MRSA PCR Screening     Status: None   Collection Time: 01/27/17 11:27 PM  Result Value Ref Range Status   MRSA by PCR NEGATIVE NEGATIVE Final    Comment:        The GeneXpert MRSA Assay (FDA approved for NASAL specimens only), is one component of a comprehensive MRSA colonization surveillance program. It is not intended to diagnose MRSA infection nor to guide  or monitor treatment for MRSA infections.      Labs: Basic Metabolic Panel:  Recent Labs Lab 01/27/17 1611 01/28/17 0021 01/28/17 1519 01/29/17 0243  NA 123* 124* 126* 127*  K 4.0 4.0 3.8 3.7  CL 92* 91* 95* 97*  CO2 19* GLUCOSE 161* 132* 103* 101*  BUN CREATININE 2.07* 1.77* 1.24 1.07  CALCIUM 9.4 9.0 8.6* 8.5*   Liver Function Tests:  Recent Labs Lab 01/27/17 1611  AST 56*  ALT 34  ALKPHOS 74  BILITOT 1.5*  PROT 7.1  ALBUMIN 3.9   No results for input(s): LIPASE, AMYLASE in the last 168 hours.  Recent Labs Lab 01/27/17 1939  AMMONIA 37*   CBC:  Recent Labs Lab 01/27/17 1611 01/28/17 0021  WBC 8.1 6.5  NEUTROABS 6.4  --   HGB 11.2* 11.0*  HCT 31.9* 30.8*  MCV 98.2 98.7  PLT 174 165   Cardiac Enzymes: No results for input(s): CKTOTAL, CKMB, CKMBINDEX, TROPONINI in the last 168 hours. BNP: BNP (last 3 results) No results for input(s): BNP in the last 8760 hours.  ProBNP (last 3 results) No results for input(s): PROBNP in the last 8760 hours.  CBG:  Recent Labs Lab 01/27/17 1628  GLUCAP 175*       Signed:  Edsel Petrin  [facility] 01/29/2017, 8:40 AM

## 2017-01-29 NOTE — Discharge Instructions (Signed)
Toxic Metabolic Encephalopathy °Toxic metabolic encephalopathy (TME) is a type of brain disorder caused by a change in brain chemistry. This condition may result from illnesses or conditions that cause an imbalance of fluid, minerals (electrolytes), and other substances in the body that affect the way the brain functions. It is not caused by brain damage or brain disease. °TME can cause confusion and other mental disturbances, which are generally referred to as delirium. Untreated delirium may lead to permanent mental changes or worsening medical conditions. Untreated delirium is a life-threatening condition that may need to be treated in the hospital. °What are the causes? °Possible causes of TME that can lead to delirium include: °· Short-term (acute) or long-term (chronic) disease of the kidney or liver. °· Not having enough fluid in the body (dehydration). °· Changes in the acid level (pH) of the blood. °· High or low levels of any of the following substances in the blood: °? Calcium. °? Salt (sodium). °? Sugar (glucose). °? Magnesium. °? Phosphate. °· High body temperature. °· Not having enough oxygen in the blood. °· Low levels of B vitamins. This can result from alcohol abuse. °· Certain medicines, such as steroids and medicines that reduce the activity of the immune system (immunosuppressants). °· Certain infections. ° °What increases the risk? °You may have a higher risk for TME if you: °· Are elderly. °· Have dementia. °· Are in the hospital, especially in intensive care. °· Live in a nursing home. °· Had recent surgery. °· Have liver or kidney disease. °· Have poorly controlled diabetes. °· Have chronic medical problems, especially heart or lung disease. °· Are not getting enough fluids. °· Have poor nutrition. °· Abuse alcohol. ° °What are the signs or symptoms? °Symptoms of TME may include: °· Muscle stiffness or jerking (spasticity). °· Shaking (tremors). °· Flapping of the  hands. °· Weakness. °· Clumsiness. °· Slowed breathing. °· Jerky movements that you cannot control (seizures). °· Not being able to stay awake (drowsiness). °· Not being able to pay attention. °· Loss of consciousness (coma). ° °Symptoms of delirium caused by TME include: °· Confusion. °· Difficulty focusing or concentrating, or inability to focus or concentrate. °· Not knowing where you are (disorientation). °· Seeing or hearing things that are not real (hallucinations). °· Fearfulness. °· False beliefs (delusions). °· Changes in mood or personality. °· Changes in speech, such as saying things that do not make sense. °· Memory loss. °· Irritability. °· Avoiding other people (withdrawal). °· Depression. °· Poor judgment. °· Changes in eating and sleeping patterns. °· Hyperactivity. °· Decreased alertness. °· General mistrust of others (paranoia). ° °Delirium may come and go. Symptoms of delirium may start suddenly or gradually, and they often get worse at night. °How is this diagnosed? °This condition is diagnosed based on: °· Your symptoms and behavior. °· An exam to check how you are thinking, feeling, and behaving (mental status exam). To diagnose delirium, the mental status exam must rule out other possible causes of TME, and must show: °? Changes in attention and awareness. °? Changes that develop over a short period of time and tend to come and go (fluctuate). °? Changes in memory, language, and thinking that were not present before. °· A physical exam. °· Imaging tests, such as: °? MRI. °? CT scan. °· Blood tests to: °? Measure liver and kidney function. °? Check for a lack (deficiency) of vitamin B. °? Check for changes in acid levels (pH) and changes in calcium, sodium, or magnesium levels   in the blood. °? Measure your blood sugar (glucose). °? Measure your blood oxygen level. ° °How is this treated? °Treatment for TME depends on the cause, and it may include. °· Getting fluids through an IV  tube. °· Regulating calcium, sodium, glucose, or magnesium levels in the body. °· Getting oxygen. °· Improving nutrition. °· Treating liver or kidney disease. °· Adjusting certain medicines. °· Treating infections. ° °If the cause is found and treated, delirium usually improves. Managing delirium may include: °· Keeping the room well-lit and quiet. °· Using calendars, pictures, and clocks to prevent disorientation. °· Having frequent checks from nursing staff and visits from caregivers. °· Wearing eyeglasses or a hearing aid, if needed. °· Physical therapy. °· Medicine to treat agitation, anxiety, hallucinations, or delusions. ° °Follow these instructions at home: °· Drink enough fluid to keep your urine clear or pale yellow. °· Take over-the-counter and prescription medicines only as told by your health care provider. °· Return to your normal activities as told by your health care provider. Ask your health care provider what activities are safe for you. °· Follow a healthy diet. Do not skip meals. °· Do not drink alcohol. °· Go to bed at the same time every night. °· Keep all follow-up visits as told by your health care provider. This is important. °Contact a health care provider if: °· You are unable to feed yourself or hydrate yourself. °· You need help at home. °· You start to feel clumsy. °· You start to have tremors or weakness. °Get help right away if: °· You have a seizure. °· You lose consciousness. °· You have trouble breathing. °· You do not feel able to care for yourself at home. °· You have a fever. °· You become disoriented at home. °This information is not intended to replace advice given to you by your health care provider. Make sure you discuss any questions you have with your health care provider. °Document Released: 09/18/2015 Document Revised: 12/14/2015 Document Reviewed: 09/18/2015 °Elsevier Interactive Patient Education © 2018 [employer] ° °

## 2017-02-15 ENCOUNTER — Emergency Department
Admission: EM | Admit: 2017-02-15 | Discharge: 2017-02-15 | Disposition: A | Discharge status: Home / Self Care | Payer: PPO | Attending: Emergency Medicine | Admitting: Emergency Medicine

## 2017-02-15 ENCOUNTER — Emergency Department: Payer: PPO

## 2017-02-15 ENCOUNTER — Encounter: Payer: Self-pay | Admitting: Intensive Care

## 2017-02-15 DIAGNOSIS — Z79899 Other long term (current) drug therapy: Secondary | ICD-10-CM | POA: Diagnosis not present

## 2017-02-15 DIAGNOSIS — S0990XA Unspecified injury of head, initial encounter: Secondary | ICD-10-CM

## 2017-02-15 DIAGNOSIS — Y92009 Unspecified place in unspecified non-institutional (private) residence as the place of occurrence of the external cause: Secondary | ICD-10-CM | POA: Diagnosis not present

## 2017-02-15 DIAGNOSIS — Y999 Unspecified external cause status: Secondary | ICD-10-CM | POA: Insufficient documentation

## 2017-02-15 DIAGNOSIS — I714 Abdominal aortic aneurysm, without rupture: Secondary | ICD-10-CM | POA: Insufficient documentation

## 2017-02-15 DIAGNOSIS — I10 Essential (primary) hypertension: Secondary | ICD-10-CM | POA: Diagnosis not present

## 2017-02-15 DIAGNOSIS — Y9389 Activity, other specified: Secondary | ICD-10-CM | POA: Insufficient documentation

## 2017-02-15 DIAGNOSIS — S0001XA Abrasion of scalp, initial encounter: Secondary | ICD-10-CM | POA: Diagnosis not present

## 2017-02-15 DIAGNOSIS — W108XXA Fall (on) (from) other stairs and steps, initial encounter: Secondary | ICD-10-CM | POA: Diagnosis not present

## 2017-02-15 DIAGNOSIS — S0003XA Contusion of scalp, initial encounter: Secondary | ICD-10-CM | POA: Diagnosis not present

## 2017-02-15 DIAGNOSIS — S0101XA Laceration without foreign body of scalp, initial encounter: Secondary | ICD-10-CM | POA: Insufficient documentation

## 2017-02-15 DIAGNOSIS — W19XXXA Unspecified fall, initial encounter: Secondary | ICD-10-CM

## 2017-02-15 DIAGNOSIS — S199XXA Unspecified injury of neck, initial encounter: Secondary | ICD-10-CM | POA: Diagnosis not present

## 2017-02-15 MED ORDER — LIDOCAINE HCL (PF) 1 % IJ SOLN
INTRAMUSCULAR | Status: AC
  Filled 2017-02-15: qty 5

## 2017-02-15 MED ORDER — ONDANSETRON 4 MG PO TBDP
4.0000 mg | ORAL_TABLET | Freq: Once | ORAL | Status: AC
  Administered 2017-02-15: 4 mg via ORAL

## 2017-02-15 MED ORDER — ONDANSETRON 4 MG PO TBDP
ORAL_TABLET | ORAL | Status: AC
  Filled 2017-02-15: qty 1

## 2017-02-15 NOTE — ED Notes (Signed)
Dried blood cleaned from back of head. Small hematoma noted. No active bleeding noted at this time.

## 2017-02-15 NOTE — ED Notes (Signed)
Patient given saltines and ginger ale per MD. Patient reports relief of nausea with medication.

## 2017-02-15 NOTE — ED Provider Notes (Signed)
[facility] Emergency Department Provider Note  ____________________________________________   First MD Initiated Contact with Patient 02/15/17 1423     (approximate)  I have reviewed the triage vital signs and the nursing notes.   HISTORY  Chief Complaint Fall    HPI [name] is a 76 y.o. male with medical history as listed below who is had several falls in the relatively recent past who presents for evaluation of a fall on the stairs and a posterior head injury.  His fall occurred acutely last night while he was walking up several stairs into his home.  He does not know what made him fall, he just feels like his knees gave out and he fell down striking the back of his head on some bricks.  He did not lose consciousness but did have a large amount of bleeding from the posterior head laceration.  He says there was "a bit of a debate" at home about whether or not to wash it but he was told not to get it wet, so there is still a great deal of dried blood around the area which is no longer actively bleeding.  He states that his head has only a very mild dull ache, perhaps a 1 out of 10.  But he was generally in his usual state of health with only a little bit of aching discomfort in his right buttock from the fall in the 1 out of 10 headache until he had been waiting about 3 hours in the emergency department to be seen and evaluated for the head injury.  At that point he started feeling nauseated the "I believe because I am so hungry" and had a small volume of biliary emesis.  He states he feels better now and would like something to eat and drink.  Has had no visual changes and back pain, chest pain, shortness of breath, abdominal pain, and dysuria.  He has some aching pain in his right buttock and no other pain in his extremities.  He states that he is generally in his typical state of health and feels "pretty well", he only came in today to be checked out for the  head injury to see if it needs to be repaired.  Past Medical History:  Diagnosis Date  . Abnormal EKG   . Alcoholism (HCC)   . Ascending aortic aneurysm (HCC)   . Edema of both legs   . Gout   . History of palpitations   . Hypertension   . Personal history of gout     Patient Active Problem List   Diagnosis Date Noted  . Acute encephalopathy 01/27/2017  . Chronic hyponatremia 01/27/2017  . AKI (acute kidney injury) (HCC) 01/27/2017  . Alcoholism (HCC) 01/27/2017  . Ascending aortic aneurysm (HCC) 11/29/2016  . Bradycardia 11/29/2016  . Moderate mitral insufficiency 11/29/2016  . Edema of both legs   . Hypertension   . Personal history of gout   . History of palpitations   . Abnormal EKG 11/01/2016  . Benign essential hypertension 10/10/2016    Past Surgical History:  Procedure Laterality Date  . incision of left neck      Prior to Admission medications   Medication Sig Start Date End Date Taking? Authorizing Provider  allopurinol (ZYLOPRIM) 300 MG tablet Take 300 mg by mouth daily.     [provider]  aspirin EC 81 MG tablet Take 81 mg by mouth daily.    [provider]  chlorthalidone (  HYGROTON) 25 MG tablet Take 25 mg by mouth daily.  11/29/16 11/29/17  [provider]  colchicine 0.6 MG tablet Take 0.6 mg by mouth 2 (two) times daily as needed (gout flares).     [provider]  cyanocobalamin 500 MCG tablet Take 500 mcg by mouth daily. Vitamin B12    [provider]  diphenhydrAMINE (BENADRYL) 25 MG tablet Take 25 mg by mouth at bedtime as needed for sleep.    [provider]  folic acid (FOLVITE) 1 MG tablet Take 1 tablet (1 mg total) by mouth daily. 01/29/17   Mikhail, Nita Sells, DO  Ginseng 100 MG CAPS Take 100 mg by mouth daily.    [provider]  ketoconazole (NIZORAL) 2 % cream Apply 1 application topically 2 (two) times daily as needed (rash).     [provider]  lisinopril (PRINIVIL,ZESTRIL)  30 MG tablet Take 30 mg by mouth daily.  08/26/16   [provider]  metoprolol tartrate (LOPRESSOR) 50 MG tablet Take 25 mg by mouth daily.  06/27/16   [provider]  Multiple Vitamin (MULTIVITAMIN WITH MINERALS) TABS tablet Take 1 tablet by mouth daily. 01/29/17   Mikhail, Nita Sells, DO  naproxen (NAPROSYN) 500 MG tablet Take 500 mg by mouth 2 (two) times daily as needed (pain).     [provider]  OVER THE COUNTER MEDICATION Take 1 tablet by mouth daily. Super Glucosamine 2000 plus - glucosamine, MSM, Boron, Silica and Hyaluronic Acid, Vitamin C, Manganese, Sodium    [provider]  thiamine 100 MG tablet Take 1 tablet (100 mg total) by mouth daily. 01/29/17   Mikhail, Nita Sells, DO  tiZANidine (ZANAFLEX) 4 MG tablet Take 4 mg by mouth daily as needed for muscle spasms.     [provider]    Allergies No known allergies  Family History  Problem Relation Age of Onset  . Lung cancer Father     Social History Social History  Substance Use Topics  . Smoking status: Never Smoker  . Smokeless tobacco: Never Used  . Alcohol use 12.6 oz/week    21 Cans of beer per week    Review of Systems Constitutional: No fever/chills Eyes: No visual changes. ENT: No sore throat. Cardiovascular: Denies chest pain. Respiratory: Denies shortness of breath. Gastrointestinal: No abdominal pain.  Nausea and one episode of emesis while in the waiting room.  No diarrhea.  No constipation. Genitourinary: Negative for dysuria. Musculoskeletal: Posterior head injury from last night.  Mild aching pain in the right buttock after fall.  Negative for neck pain.  Negative for back pain. Integumentary: Negative for rash. Neurological: Negative for headaches, focal weakness or numbness.   ____________________________________________   PHYSICAL EXAM:  VITAL SIGNS: ED Triage Vitals  Enc Vitals Group     BP 02/15/17 1133 128/68     Pulse Rate 02/15/17 1133 60      Resp 02/15/17 1133 16     Temp 02/15/17 1133 98.1 F (36.7 C)     Temp Source 02/15/17 1133 Oral     SpO2 02/15/17 1133 100 %     Weight 02/15/17 1134 100.2 kg (221 lb)     Height 02/15/17 1134 1.829 m (6')     Head Circumference --      Peak Flow --      Pain Score 02/15/17 1132 5     Pain Loc --      Pain Edu? --      Excl. in  GC? --     Constitutional: Alert and oriented. No acute distress. Eyes: Conjunctivae are normal. PERRL. EOMI. Head: Large posterior contusion/hematoma with a laceration that is completely obscured by caked and dried blood.  There is a moderate amount of dried blood on his head and shoulders.  After the dried blood was cleaned off and the wound was extensively irrigated, there is an area approximately 5 cm x 5 cm primarily of abrasion but with several small and relatively superficial lacerations.  There is a larger central wound that has 1 cm wide of a hematoma protruding from a 1 cm laceration.  There is a larger hematoma underneath the skin but the wound/laceration is only about 1 cm wide. Nose: No congestion/rhinnorhea. Mouth/Throat: Mucous membranes are moist. Neck: No stridor.  No meningeal signs.  No cervical spine tenderness to palpation. Cardiovascular: Normal rate, regular rhythm. Good peripheral circulation. Grossly normal heart sounds. Respiratory: Normal respiratory effort.  No retractions. Lungs CTAB. Gastrointestinal: Soft and nontender. No distention.  Musculoskeletal: Patient is wearing orthopedic boots on both feet.  He is able to ambulate "gingerly".  He has no tenderness to palpation of his upper extremities or pain with range of motion.  He has no tenderness to palpation of his pelvis, buttocks, femurs, normal lower extremities. Neurologic:  Normal speech and language. No gross focal neurologic deficits are appreciated.  Skin:  Skin is warm, dry and intact except for the posterior head injury.  Psychiatric: Mood and affect are normal. Speech and  behavior are normal.  ____________________________________________   LABS (all labs ordered are listed, but only abnormal results are displayed)  Labs Reviewed - No data to display ____________________________________________  EKG  ED ECG REPORT I, Haven Foss, the attending physician, personally viewed and interpreted this ECG.  Date: 02/15/2017 EKG Time: 11:32 AM Rate: 59 Rhythm: normal sinus rhythm QRS Axis: normal Intervals: normal ST/T Wave abnormalities: normal Narrative Interpretation: no evidence of acute ischemia  ____________________________________________  RADIOLOGY   Ct Head Wo Contrast  Result Date: 02/15/2017 CLINICAL DATA:  Patient fell while walking downstairs. Scalp hematoma posteriorly. EXAM: CT HEAD WITHOUT CONTRAST CT CERVICAL SPINE WITHOUT CONTRAST TECHNIQUE: Multidetector CT imaging of the head and cervical spine was performed following the standard protocol without intravenous contrast. Multiplanar CT image reconstructions of the cervical spine were also generated. COMPARISON:  Head CT 01/27/2017 FINDINGS: CT HEAD FINDINGS Brain: There is no evidence for acute hemorrhage, hydrocephalus, mass lesion, or abnormal extra-axial fluid collection. No definite CT evidence for acute infarction. Diffuse loss of parenchymal volume is consistent with atrophy. Patchy low attenuation in the deep hemispheric and periventricular white matter is nonspecific, but likely reflects chronic microvascular ischemic demyelination. Vascular: No hyperdense vessel or unexpected calcification. Skull: No evidence for fracture. No worrisome lytic or sclerotic lesion. Sinuses/Orbits: Polypoid chronic mucosal disease noted left sphenoid sinus. There is some trace fluid in the inferior right mastoid air cells, stable. Other: Posterior right parietal scalp contusion with subcutaneous gas consistent with laceration. CT CERVICAL SPINE FINDINGS Alignment: Normal. Skull base and vertebrae: No  acute fracture. No primary bone lesion or focal pathologic process. Soft tissues and spinal canal: No prevertebral fluid or swelling. No visible canal hematoma. Disc levels: Loss of disc height with endplate degeneration noted at C3-4 and C5-6. Endplate spurring visible at C6-7. Upper chest: Unremarkable. Other: None. IMPRESSION: 1. No acute intracranial abnormality. Atrophy with chronic small vessel white matter ischemic disease. 2. Posterior right parietal scalp laceration/contusion. 3. Degenerative changes in the cervical spine  without evidence of fracture. Electronically Signed   By: Kennith Center M.D.   On: 02/15/2017 12:20   Ct Cervical Spine Wo Contrast  Result Date: 02/15/2017 CLINICAL DATA:  Patient fell while walking downstairs. Scalp hematoma posteriorly. EXAM: CT HEAD WITHOUT CONTRAST CT CERVICAL SPINE WITHOUT CONTRAST TECHNIQUE: Multidetector CT imaging of the head and cervical spine was performed following the standard protocol without intravenous contrast. Multiplanar CT image reconstructions of the cervical spine were also generated. COMPARISON:  Head CT 01/27/2017 FINDINGS: CT HEAD FINDINGS Brain: There is no evidence for acute hemorrhage, hydrocephalus, mass lesion, or abnormal extra-axial fluid collection. No definite CT evidence for acute infarction. Diffuse loss of parenchymal volume is consistent with atrophy. Patchy low attenuation in the deep hemispheric and periventricular white matter is nonspecific, but likely reflects chronic microvascular ischemic demyelination. Vascular: No hyperdense vessel or unexpected calcification. Skull: No evidence for fracture. No worrisome lytic or sclerotic lesion. Sinuses/Orbits: Polypoid chronic mucosal disease noted left sphenoid sinus. There is some trace fluid in the inferior right mastoid air cells, stable. Other: Posterior right parietal scalp contusion with subcutaneous gas consistent with laceration. CT CERVICAL SPINE FINDINGS Alignment: Normal.  Skull base and vertebrae: No acute fracture. No primary bone lesion or focal pathologic process. Soft tissues and spinal canal: No prevertebral fluid or swelling. No visible canal hematoma. Disc levels: Loss of disc height with endplate degeneration noted at C3-4 and C5-6. Endplate spurring visible at C6-7. Upper chest: Unremarkable. Other: None. IMPRESSION: 1. No acute intracranial abnormality. Atrophy with chronic small vessel white matter ischemic disease. 2. Posterior right parietal scalp laceration/contusion. 3. Degenerative changes in the cervical spine without evidence of fracture. Electronically Signed   By: Kennith Center M.D.   On: 02/15/2017 12:20    ____________________________________________   PROCEDURES  Critical Care performed: No   Procedure(s) performed:   Procedures   ____________________________________________   INITIAL IMPRESSION / ASSESSMENT AND PLAN / ED COURSE  As part of my medical decision making, I reviewed the following data within the electronic MEDICAL RECORD NUMBER Nursing notes reviewed and incorporated and Notes from prior ED visits    Differential diagnosis includes traumatic intracranial bleeding, skull fracture, CVA, nonspecific musculoskeletal injury, ACS.  The patient's CT head and CT cervical spine acute or emergent condition.  His EKG is reassuring and he is in no distress, denies chest pain or shortness of breath, and seems to be alert and oriented.  No indication for lab work at this time.  His nurse will clean off the head injury to wash it thoroughly and determine if any kind of repair is needed although I am somewhat concerned about the length of time it has been open (most likely at least 15 hours).  Although the patient did have an episode of nausea and vomiting which may be indicative of concussion, it occurred nearly 15 hours after the injury, is hungry.  We will test with some saltines and ginger ale and a Zofran to see if this helps him feel  better  Clinical Course as of Feb 15 1738  Wed Feb 15, 2017  1530 After 2 separate examinations, I came to the decision that trying to evacuate the large hematoma from a 1 cm laceration approximately 19 hours after the wound occurred would be potentially detrimental for the patient.  I run the risk of introducing infection where it is not already present, and possibly making the wound itself worse.  The other wounds do not clearly require staples or sutures, and  again, given the fact that the injury occurred about 19 hours ago, I am reluctant to close the wound by primary intention and risk trapping bacteria.  Explained all this to the patient.  Given that the scalp is highly vascular and the open area is relatively small, I think that the risk of side effects of antibiotics are greater in the benefit, but I gave him strict return precautions should he start to develop any concerning signs or symptoms and I encouraged him to follow-up within a few days with his primary care doctor.  He understands and agrees with the plan.  He is ambulating without any difficulty and is in no distress.  [CF]    Clinical Course User Index [CF] Loleta RoseForbach, Laiza Veenstra, MD    ____________________________________________  FINAL CLINICAL IMPRESSION(S) / ED DIAGNOSES  Final diagnoses:  Injury of head, initial encounter  Fall, initial encounter  Laceration of scalp, initial encounter  Abrasion of scalp, initial encounter     MEDICATIONS GIVEN DURING THIS VISIT:  Medications  ondansetron (ZOFRAN-ODT) disintegrating tablet 4 mg (4 mg Oral Given 02/15/17 1432)     NEW OUTPATIENT MEDICATIONS STARTED DURING THIS VISIT:  Discharge Medication List as of 02/15/2017  4:29 PM      Discharge Medication List as of 02/15/2017  4:29 PM      Discharge Medication List as of 02/15/2017  4:29 PM       Note:  This document was prepared using Dragon voice recognition software and may include unintentional dictation  errors.    Loleta RoseForbach, Aira Sallade, MD 02/15/17 1739

## 2017-02-15 NOTE — ED Notes (Signed)
Patient reports falling down stairs and hitting head and bottom. Large knot with dried blood noted to back of head. Patient denies losing consciousness. Now reporting nausea and one episode of vomiting.

## 2017-02-15 NOTE — ED Triage Notes (Addendum)
Patient reports he was walking down stairs today in his home and fell. Reports he did not trip and denies LOC. He reports he is unsure what made him fall. Takes low does aspirin daily. Patient has large hematoma on back of head. Dried bloody drainage noted all around site. Only c/o pain in R buttocks A&O x4 in triage.

## 2017-02-15 NOTE — Discharge Instructions (Signed)
You have been seen in the Emergency Department (ED) today for a fall.  Your work up (including the CT scan of your head and neck) does not show any concerning injuries.  Please take over-the-counter ibuprofen and/or Tylenol as needed for your pain (unless you have an allergy or your doctor as told you not to take them), or take any prescribed medication as instructed.  Because it has been so long since you injured your head last night, it is better if we allow the wound to heal on its own rather than closing it with staples or sutures and risk trapping bacteria.  It you have a large blood clot (hematoma) underneath 1 of the cuts on the back of your head.  Your body should reabsorb this blood in time.  It is okay for you to shower and wash your hair normally.  You can expect that the wounds will continue to bleed a little bit but they should not be bleeding heavily.  Please change the dressing on your scalp wound at least twice daily or more frequently if it gets dirty or wet.  You may use over-the-counter antibiotic ointment such as bacitracin or Neosporin.  Please follow up with your doctor regarding today's Emergency Department (ED) visit and your recent fall.    Return to the ED if you have any headache, confusion, slurred speech, weakness/numbness of any arm or leg, or any increased pain.

## 2017-02-15 NOTE — ED Notes (Signed)
No bleeding from head injury at this time.

## 2017-02-22 DIAGNOSIS — F1099 Alcohol use, unspecified with unspecified alcohol-induced disorder: Secondary | ICD-10-CM | POA: Diagnosis not present

## 2017-02-22 DIAGNOSIS — Z87898 Personal history of other specified conditions: Secondary | ICD-10-CM | POA: Diagnosis not present

## 2017-02-22 DIAGNOSIS — I340 Nonrheumatic mitral (valve) insufficiency: Secondary | ICD-10-CM | POA: Diagnosis not present

## 2017-02-22 DIAGNOSIS — I10 Essential (primary) hypertension: Secondary | ICD-10-CM | POA: Diagnosis not present

## 2017-02-22 DIAGNOSIS — E871 Hypo-osmolality and hyponatremia: Secondary | ICD-10-CM | POA: Diagnosis not present

## 2017-02-22 DIAGNOSIS — Z9181 History of falling: Secondary | ICD-10-CM | POA: Diagnosis not present

## 2017-05-05 DIAGNOSIS — I340 Nonrheumatic mitral (valve) insufficiency: Secondary | ICD-10-CM | POA: Diagnosis not present

## 2017-05-05 DIAGNOSIS — I77810 Thoracic aortic ectasia: Secondary | ICD-10-CM | POA: Diagnosis not present

## 2017-05-05 DIAGNOSIS — Z8739 Personal history of other diseases of the musculoskeletal system and connective tissue: Secondary | ICD-10-CM | POA: Diagnosis not present

## 2017-05-05 DIAGNOSIS — D649 Anemia, unspecified: Secondary | ICD-10-CM | POA: Diagnosis not present

## 2017-05-05 DIAGNOSIS — I10 Essential (primary) hypertension: Secondary | ICD-10-CM | POA: Diagnosis not present

## 2017-05-12 DIAGNOSIS — I77810 Thoracic aortic ectasia: Secondary | ICD-10-CM | POA: Diagnosis not present

## 2017-05-12 DIAGNOSIS — D649 Anemia, unspecified: Secondary | ICD-10-CM | POA: Diagnosis not present

## 2017-05-12 DIAGNOSIS — I340 Nonrheumatic mitral (valve) insufficiency: Secondary | ICD-10-CM | POA: Diagnosis not present

## 2017-05-12 DIAGNOSIS — I10 Essential (primary) hypertension: Secondary | ICD-10-CM | POA: Diagnosis not present

## 2017-05-12 DIAGNOSIS — Z8739 Personal history of other diseases of the musculoskeletal system and connective tissue: Secondary | ICD-10-CM | POA: Diagnosis not present

## 2017-05-15 DIAGNOSIS — I10 Essential (primary) hypertension: Secondary | ICD-10-CM | POA: Diagnosis not present

## 2017-05-15 DIAGNOSIS — I712 Thoracic aortic aneurysm, without rupture: Secondary | ICD-10-CM | POA: Diagnosis not present

## 2017-05-15 DIAGNOSIS — E871 Hypo-osmolality and hyponatremia: Secondary | ICD-10-CM | POA: Diagnosis not present

## 2017-05-15 DIAGNOSIS — Z87898 Personal history of other specified conditions: Secondary | ICD-10-CM | POA: Diagnosis not present

## 2017-05-15 DIAGNOSIS — Z23 Encounter for immunization: Secondary | ICD-10-CM | POA: Diagnosis not present

## 2017-05-15 DIAGNOSIS — Z8739 Personal history of other diseases of the musculoskeletal system and connective tissue: Secondary | ICD-10-CM | POA: Diagnosis not present

## 2017-05-15 DIAGNOSIS — D649 Anemia, unspecified: Secondary | ICD-10-CM | POA: Diagnosis not present

## 2017-05-15 DIAGNOSIS — F1099 Alcohol use, unspecified with unspecified alcohol-induced disorder: Secondary | ICD-10-CM | POA: Diagnosis not present

## 2017-05-15 DIAGNOSIS — Z0000 Encounter for general adult medical examination without abnormal findings: Secondary | ICD-10-CM | POA: Diagnosis not present

## 2017-08-09 DIAGNOSIS — F1099 Alcohol use, unspecified with unspecified alcohol-induced disorder: Secondary | ICD-10-CM | POA: Diagnosis not present

## 2017-08-09 DIAGNOSIS — Z87898 Personal history of other specified conditions: Secondary | ICD-10-CM | POA: Diagnosis not present

## 2017-08-09 DIAGNOSIS — I10 Essential (primary) hypertension: Secondary | ICD-10-CM | POA: Diagnosis not present

## 2017-08-09 DIAGNOSIS — D649 Anemia, unspecified: Secondary | ICD-10-CM | POA: Diagnosis not present

## 2017-08-09 DIAGNOSIS — E538 Deficiency of other specified B group vitamins: Secondary | ICD-10-CM | POA: Diagnosis not present

## 2017-08-09 DIAGNOSIS — I712 Thoracic aortic aneurysm, without rupture: Secondary | ICD-10-CM | POA: Diagnosis not present

## 2017-08-09 DIAGNOSIS — E871 Hypo-osmolality and hyponatremia: Secondary | ICD-10-CM | POA: Diagnosis not present

## 2017-08-09 DIAGNOSIS — Z8739 Personal history of other diseases of the musculoskeletal system and connective tissue: Secondary | ICD-10-CM | POA: Diagnosis not present

## 2017-08-15 DIAGNOSIS — E871 Hypo-osmolality and hyponatremia: Secondary | ICD-10-CM | POA: Diagnosis not present

## 2017-08-15 DIAGNOSIS — I10 Essential (primary) hypertension: Secondary | ICD-10-CM | POA: Diagnosis not present

## 2017-08-15 DIAGNOSIS — I712 Thoracic aortic aneurysm, without rupture: Secondary | ICD-10-CM | POA: Diagnosis not present

## 2017-08-15 DIAGNOSIS — D649 Anemia, unspecified: Secondary | ICD-10-CM | POA: Diagnosis not present

## 2017-08-15 DIAGNOSIS — R7989 Other specified abnormal findings of blood chemistry: Secondary | ICD-10-CM | POA: Diagnosis not present

## 2017-09-21 DIAGNOSIS — D649 Anemia, unspecified: Secondary | ICD-10-CM | POA: Diagnosis not present

## 2017-11-27 DIAGNOSIS — H26493 Other secondary cataract, bilateral: Secondary | ICD-10-CM | POA: Diagnosis not present

## 2017-11-27 DIAGNOSIS — H35033 Hypertensive retinopathy, bilateral: Secondary | ICD-10-CM | POA: Diagnosis not present

## 2017-11-27 DIAGNOSIS — H353130 Nonexudative age-related macular degeneration, bilateral, stage unspecified: Secondary | ICD-10-CM | POA: Diagnosis not present

## 2018-01-27 ENCOUNTER — Ambulatory Visit: Payer: Self-pay | Admitting: Internal Medicine

## 2018-01-27 ENCOUNTER — Encounter: Payer: Self-pay | Admitting: Internal Medicine

## 2018-01-27 VITALS — BP 184/61 | HR 86 | Temp 98.0°F | Wt 241.0 lb

## 2018-01-27 DIAGNOSIS — M545 Low back pain, unspecified: Secondary | ICD-10-CM

## 2018-01-27 DIAGNOSIS — M1009 Idiopathic gout, multiple sites: Secondary | ICD-10-CM

## 2018-01-27 DIAGNOSIS — I10 Essential (primary) hypertension: Secondary | ICD-10-CM

## 2018-01-27 DIAGNOSIS — N521 Erectile dysfunction due to diseases classified elsewhere: Secondary | ICD-10-CM

## 2018-01-27 MED ORDER — LISINOPRIL 30 MG PO TABS: 30.0000 mg | ORAL_TABLET | Freq: Every day | ORAL | 4 refills | Status: AC

## 2018-01-27 MED ORDER — FUROSEMIDE 20 MG PO TABS: 20.0000 mg | ORAL_TABLET | Freq: Every day | ORAL | 4 refills | Status: DC

## 2018-01-27 MED ORDER — SILDENAFIL CITRATE 100 MG PO TABS: 50.0000 mg | ORAL_TABLET | Freq: Every day | ORAL | 6 refills | Status: DC | PRN

## 2018-01-27 MED ORDER — ALLOPURINOL 300 MG PO TABS: 300.0000 mg | ORAL_TABLET | Freq: Every day | ORAL | 3 refills | Status: DC

## 2018-01-27 MED ORDER — TIZANIDINE HCL 4 MG PO TABS: 4.0000 mg | ORAL_TABLET | Freq: Every day | ORAL | 3 refills | Status: DC | PRN

## 2018-01-27 NOTE — Progress Notes (Signed)
Patient Name: [name]  [id number]  [id number]  Date of Service: 01/29/2018  Chief Complaint  Patient presents with  . Medication Refill  . Hypertension    Medication Refill  This is a chronic problem. The current episode started more than 1 year ago. Pertinent negatives include no abdominal pain, arthralgias, chest pain, chills, congestion, coughing, fatigue, joint swelling, nausea, neck pain, numbness, rash, sore throat or vomiting.  Hypertension  This is a chronic problem. The current episode started more than 1 year ago. The problem has been gradually improving since onset. Associated symptoms include peripheral edema. Pertinent negatives include no chest pain, neck pain, palpitations or shortness of breath.   Current Medication: Outpatient Encounter Medications as of 01/27/2018  Medication Sig Note  . allopurinol (ZYLOPRIM) 300 MG tablet Take 1 tablet (300 mg total) by mouth daily.   Marland Kitchen aspirin EC 81 MG tablet Take 81 mg by mouth daily.   . chlorthalidone (HYGROTON) 25 MG tablet Take 25 mg by mouth daily.    . colchicine 0.6 MG tablet Take 0.6 mg by mouth 2 (two) times daily as needed (gout flares).    . cyanocobalamin 500 MCG tablet Take 500 mcg by mouth daily. Vitamin B12   . diphenhydrAMINE (BENADRYL) 25 MG tablet Take 25 mg by mouth at bedtime as needed for sleep.   . folic acid (FOLVITE) 1 MG tablet Take 1 tablet (1 mg total) by mouth daily.   . furosemide (LASIX) 20 MG tablet Take 1 tablet (20 mg total) by mouth daily.   . Ginseng 100 MG CAPS Take 100 mg by mouth daily.   Marland Kitchen ketoconazole (NIZORAL) 2 % cream Apply 1 application topically 2 (two) times daily as needed (rash).    Marland Kitchen lisinopril (PRINIVIL,ZESTRIL) 30 MG tablet Take 1 tablet (30 mg total) by mouth daily.   . metoprolol tartrate (LOPRESSOR) 50 MG tablet Take 25 mg by mouth daily.    . Multiple Vitamin (MULTIVITAMIN WITH MINERALS) TABS tablet Take 1 tablet by mouth daily.   . naproxen (NAPROSYN) 500 MG tablet  Take 500 mg by mouth 2 (two) times daily as needed (pain).  01/27/2017: Family thinks pt may be taking this as needed  . OVER THE COUNTER MEDICATION Take 1 tablet by mouth daily. Super Glucosamine 2000 plus - glucosamine, MSM, Boron, Silica and Hyaluronic Acid, Vitamin C, Manganese, Sodium   . sildenafil (VIAGRA) 100 MG tablet Take 0.5-1 tablets (50-100 mg total) by mouth daily as needed for erectile dysfunction.   . thiamine 100 MG tablet Take 1 tablet (100 mg total) by mouth daily.   Marland Kitchen tiZANidine (ZANAFLEX) 4 MG tablet Take 1 tablet (4 mg total) by mouth daily as needed for muscle spasms.   . [DISCONTINUED] allopurinol (ZYLOPRIM) 300 MG tablet Take 300 mg by mouth daily.    . [DISCONTINUED] lisinopril (PRINIVIL,ZESTRIL) 30 MG tablet Take 30 mg by mouth daily.   . [DISCONTINUED] tiZANidine (ZANAFLEX) 4 MG tablet Take 4 mg by mouth daily as needed for muscle spasms.  01/27/2017: Pt has 2 bottles - bottle dated 12/26/16 states 'daily', bottle dated 9/18 states 'daily as needed'   No facility-administered encounter medications on file as of 01/27/2018.     Surgical History: Past Surgical History:  Procedure Laterality Date  . incision of left neck      Medical History: Past Medical History:  Diagnosis Date  . Abnormal EKG   . Alcoholism (HCC)   . Ascending aortic aneurysm (HCC)   . Edema  of both legs   . Gout   . History of palpitations   . Hypertension   . Personal history of gout     Family History: Family History  Problem Relation Age of Onset  . Lung cancer Father     Social History   Socioeconomic History  . Marital status: Married    Spouse name: Not on file  . Number of children: Not on file  . Years of education: Not on file  . Highest education level: Not on file  Occupational History  . Not on file  Social Needs  . Financial resource strain: Not on file  . Food insecurity:    Worry: Not on file    Inability: Not on file  . Transportation needs:    Medical: Not  on file    Non-medical: Not on file  Tobacco Use  . Smoking status: Never Smoker  . Smokeless tobacco: Never Used  Substance and Sexual Activity  . Alcohol use: Yes    Alcohol/week: 21.0 standard drinks    Types: 21 Cans of beer per week  . Drug use: No  . Sexual activity: Not on file  Lifestyle  . Physical activity:    Days per week: Not on file    Minutes per session: Not on file  . Stress: Not on file  Relationships  . Social connections:    Talks on phone: Not on file    Gets together: Not on file    Attends religious service: Not on file    Active member of club or organization: Not on file    Attends meetings of clubs or organizations: Not on file    Relationship status: Not on file  . Intimate partner violence:    Fear of current or ex partner: Not on file    Emotionally abused: Not on file    Physically abused: Not on file    Forced sexual activity: Not on file  Other Topics Concern  . Not on file  Social History Narrative  . Not on file      Review of Systems  Constitutional: Negative for chills, fatigue and unexpected weight change.  HENT: Positive for postnasal drip. Negative for congestion, rhinorrhea, sneezing and sore throat.   Eyes: Negative for redness.  Respiratory: Negative for cough, chest tightness and shortness of breath.   Cardiovascular: Negative for chest pain and palpitations.  Gastrointestinal: Negative for abdominal pain, constipation, diarrhea, nausea and vomiting.  Genitourinary: Negative for dysuria and frequency.  Musculoskeletal: Negative for arthralgias, back pain, joint swelling and neck pain.  Skin: Negative for rash.  Neurological: Negative.  Negative for tremors and numbness.  Hematological: Negative for adenopathy. Does not bruise/bleed easily.  Psychiatric/Behavioral: Negative for behavioral problems (Depression), sleep disturbance and suicidal ideas. The patient is not nervous/anxious.     Vital Signs: BP (!) 184/61   Pulse  86   Temp 98 F (36.7 C)   Wt 241 lb (109.3 kg)   BMI 32.69 kg/m    Physical Exam  Constitutional: He is oriented to person, place, and time. He appears well-developed and well-nourished. No distress.  HENT:  Head: Normocephalic and atraumatic.  Mouth/Throat: Oropharynx is clear and moist. No oropharyngeal exudate.  Eyes: Pupils are equal, round, and reactive to light. EOM are normal.  Neck: Normal range of motion. Neck supple. No JVD present. No tracheal deviation present. No thyromegaly present.  Cardiovascular: Normal rate, regular rhythm and normal heart sounds. Exam reveals no gallop and  no friction rub.  No murmur heard. Pulmonary/Chest: Effort normal. No respiratory distress. He has no wheezes. He has no rales. He exhibits no tenderness.  Abdominal: Soft. Bowel sounds are normal.  Musculoskeletal: Normal range of motion.  Lymphadenopathy:    He has no cervical adenopathy.  Neurological: He is alert and oriented to person, place, and time. No cranial nerve deficit.  Skin: Skin is warm and dry. He is not diaphoretic.  Psychiatric: He has a normal mood and affect. His behavior is normal. Judgment and thought content normal.   Assessment/Plan: 1. Essential hypertension, benign - lisinopril (PRINIVIL,ZESTRIL) 30 MG tablet; Take 1 tablet (30 mg total) by mouth daily.  Dispense: 90 tablet; Refill: 4 - furosemide (LASIX) 20 MG tablet; Take 1 tablet (20 mg total) by mouth daily.  Dispense: 90 tablet; Refill: 4 - allopurinol (ZYLOPRIM) 300 MG tablet; Take 1 tablet (300 mg total) by mouth daily.  Dispense: 90 tablet; Refill: 3  2. Idiopathic gout of multiple sites, unspecified chronicity Continue meds  3. Low back pain, unspecified back pain laterality, unspecified chronicity, unspecified whether sciatica present - tiZANidine (ZANAFLEX) 4 MG tablet; Take 1 tablet (4 mg total) by mouth daily as needed for muscle spasms.  Dispense: 90 tablet; Refill: 3  4. Erectile disorder due to  medical condition in male - sildenafil (VIAGRA) 100 MG tablet; Take 0.5-1 tablets (50-100 mg total) by mouth daily as needed for erectile dysfunction.  Dispense: 15 tablet; Refill: 6  General Counseling: [name] los understanding of the findings of todays visit and agrees with plan of treatment. I have discussed any further diagnostic evaluation that may be needed or ordered today. We also reviewed his medications today. he has been encouraged to call the office with any questions or concerns that should arise related to todays visit.  Meds ordered this encounter  Medications  . lisinopril (PRINIVIL,ZESTRIL) 30 MG tablet    Sig: Take 1 tablet (30 mg total) by mouth daily.    Dispense:  90 tablet    Refill:  4  . furosemide (LASIX) 20 MG tablet    Sig: Take 1 tablet (20 mg total) by mouth daily.    Dispense:  90 tablet    Refill:  4  . tiZANidine (ZANAFLEX) 4 MG tablet    Sig: Take 1 tablet (4 mg total) by mouth daily as needed for muscle spasms.    Dispense:  90 tablet    Refill:  3  . allopurinol (ZYLOPRIM) 300 MG tablet    Sig: Take 1 tablet (300 mg total) by mouth daily.    Dispense:  90 tablet    Refill:  3  . sildenafil (VIAGRA) 100 MG tablet    Sig: Take 0.5-1 tablets (50-100 mg total) by mouth daily as needed for erectile dysfunction.    Dispense:  15 tablet    Refill:  6     Dr Lyndon Code Internal medicine

## 2018-02-09 DIAGNOSIS — E871 Hypo-osmolality and hyponatremia: Secondary | ICD-10-CM | POA: Diagnosis not present

## 2018-02-09 DIAGNOSIS — E538 Deficiency of other specified B group vitamins: Secondary | ICD-10-CM | POA: Diagnosis not present

## 2018-02-09 DIAGNOSIS — I10 Essential (primary) hypertension: Secondary | ICD-10-CM | POA: Diagnosis not present

## 2018-02-09 DIAGNOSIS — I712 Thoracic aortic aneurysm, without rupture: Secondary | ICD-10-CM | POA: Diagnosis not present

## 2018-02-09 DIAGNOSIS — Z125 Encounter for screening for malignant neoplasm of prostate: Secondary | ICD-10-CM | POA: Diagnosis not present

## 2018-02-09 DIAGNOSIS — D649 Anemia, unspecified: Secondary | ICD-10-CM | POA: Diagnosis not present

## 2018-02-12 DIAGNOSIS — I10 Essential (primary) hypertension: Secondary | ICD-10-CM | POA: Diagnosis not present

## 2018-02-12 DIAGNOSIS — E871 Hypo-osmolality and hyponatremia: Secondary | ICD-10-CM | POA: Diagnosis not present

## 2018-02-12 DIAGNOSIS — D649 Anemia, unspecified: Secondary | ICD-10-CM | POA: Diagnosis not present

## 2018-02-12 DIAGNOSIS — E538 Deficiency of other specified B group vitamins: Secondary | ICD-10-CM | POA: Diagnosis not present

## 2018-02-12 DIAGNOSIS — I712 Thoracic aortic aneurysm, without rupture: Secondary | ICD-10-CM | POA: Diagnosis not present

## 2018-02-16 DIAGNOSIS — R600 Localized edema: Secondary | ICD-10-CM | POA: Diagnosis not present

## 2018-02-16 DIAGNOSIS — Z23 Encounter for immunization: Secondary | ICD-10-CM | POA: Diagnosis not present

## 2018-02-16 DIAGNOSIS — E538 Deficiency of other specified B group vitamins: Secondary | ICD-10-CM | POA: Diagnosis not present

## 2018-02-16 DIAGNOSIS — I340 Nonrheumatic mitral (valve) insufficiency: Secondary | ICD-10-CM | POA: Diagnosis not present

## 2018-02-16 DIAGNOSIS — E871 Hypo-osmolality and hyponatremia: Secondary | ICD-10-CM | POA: Diagnosis not present

## 2018-02-16 DIAGNOSIS — Z0000 Encounter for general adult medical examination without abnormal findings: Secondary | ICD-10-CM | POA: Diagnosis not present

## 2018-02-16 DIAGNOSIS — I10 Essential (primary) hypertension: Secondary | ICD-10-CM | POA: Diagnosis not present

## 2018-08-30 DIAGNOSIS — Z0000 Encounter for general adult medical examination without abnormal findings: Secondary | ICD-10-CM | POA: Diagnosis not present

## 2018-08-30 DIAGNOSIS — R600 Localized edema: Secondary | ICD-10-CM | POA: Diagnosis not present

## 2018-08-30 DIAGNOSIS — E871 Hypo-osmolality and hyponatremia: Secondary | ICD-10-CM | POA: Diagnosis not present

## 2018-08-30 DIAGNOSIS — Z23 Encounter for immunization: Secondary | ICD-10-CM | POA: Diagnosis not present

## 2018-08-30 DIAGNOSIS — I340 Nonrheumatic mitral (valve) insufficiency: Secondary | ICD-10-CM | POA: Diagnosis not present

## 2018-08-30 DIAGNOSIS — I10 Essential (primary) hypertension: Secondary | ICD-10-CM | POA: Diagnosis not present

## 2018-09-04 DIAGNOSIS — I878 Other specified disorders of veins: Secondary | ICD-10-CM | POA: Diagnosis not present

## 2018-09-04 DIAGNOSIS — I77810 Thoracic aortic ectasia: Secondary | ICD-10-CM | POA: Diagnosis not present

## 2018-09-04 DIAGNOSIS — R600 Localized edema: Secondary | ICD-10-CM | POA: Diagnosis not present

## 2018-09-04 DIAGNOSIS — F1099 Alcohol use, unspecified with unspecified alcohol-induced disorder: Secondary | ICD-10-CM | POA: Diagnosis not present

## 2018-09-04 DIAGNOSIS — I712 Thoracic aortic aneurysm, without rupture: Secondary | ICD-10-CM | POA: Diagnosis not present

## 2018-09-04 DIAGNOSIS — L84 Corns and callosities: Secondary | ICD-10-CM | POA: Diagnosis not present

## 2018-09-04 DIAGNOSIS — Z8739 Personal history of other diseases of the musculoskeletal system and connective tissue: Secondary | ICD-10-CM | POA: Diagnosis not present

## 2018-09-04 DIAGNOSIS — I10 Essential (primary) hypertension: Secondary | ICD-10-CM | POA: Diagnosis not present

## 2018-09-04 DIAGNOSIS — D649 Anemia, unspecified: Secondary | ICD-10-CM | POA: Diagnosis not present

## 2018-09-04 DIAGNOSIS — Z0000 Encounter for general adult medical examination without abnormal findings: Secondary | ICD-10-CM | POA: Diagnosis not present

## 2018-09-04 DIAGNOSIS — E871 Hypo-osmolality and hyponatremia: Secondary | ICD-10-CM | POA: Diagnosis not present

## 2018-09-11 DIAGNOSIS — B351 Tinea unguium: Secondary | ICD-10-CM | POA: Diagnosis not present

## 2018-09-11 DIAGNOSIS — M79675 Pain in left toe(s): Secondary | ICD-10-CM | POA: Diagnosis not present

## 2018-09-11 DIAGNOSIS — M79674 Pain in right toe(s): Secondary | ICD-10-CM | POA: Diagnosis not present

## 2018-09-11 DIAGNOSIS — R600 Localized edema: Secondary | ICD-10-CM | POA: Diagnosis not present

## 2018-09-11 DIAGNOSIS — L853 Xerosis cutis: Secondary | ICD-10-CM | POA: Diagnosis not present

## 2018-10-05 DIAGNOSIS — E871 Hypo-osmolality and hyponatremia: Secondary | ICD-10-CM | POA: Diagnosis not present

## 2019-02-09 ENCOUNTER — Other Ambulatory Visit: Payer: Self-pay | Admitting: Internal Medicine

## 2019-02-09 DIAGNOSIS — M545 Low back pain, unspecified: Secondary | ICD-10-CM

## 2019-02-09 DIAGNOSIS — I10 Essential (primary) hypertension: Secondary | ICD-10-CM

## 2019-02-26 ENCOUNTER — Other Ambulatory Visit: Payer: Self-pay | Admitting: Internal Medicine

## 2019-02-28 DIAGNOSIS — I77810 Thoracic aortic ectasia: Secondary | ICD-10-CM | POA: Diagnosis not present

## 2019-02-28 DIAGNOSIS — R600 Localized edema: Secondary | ICD-10-CM | POA: Diagnosis not present

## 2019-02-28 DIAGNOSIS — F1099 Alcohol use, unspecified with unspecified alcohol-induced disorder: Secondary | ICD-10-CM | POA: Diagnosis not present

## 2019-02-28 DIAGNOSIS — Z125 Encounter for screening for malignant neoplasm of prostate: Secondary | ICD-10-CM | POA: Diagnosis not present

## 2019-02-28 DIAGNOSIS — E871 Hypo-osmolality and hyponatremia: Secondary | ICD-10-CM | POA: Diagnosis not present

## 2019-02-28 DIAGNOSIS — I10 Essential (primary) hypertension: Secondary | ICD-10-CM | POA: Diagnosis not present

## 2019-02-28 DIAGNOSIS — L84 Corns and callosities: Secondary | ICD-10-CM | POA: Diagnosis not present

## 2019-02-28 DIAGNOSIS — I712 Thoracic aortic aneurysm, without rupture: Secondary | ICD-10-CM | POA: Diagnosis not present

## 2019-02-28 DIAGNOSIS — I878 Other specified disorders of veins: Secondary | ICD-10-CM | POA: Diagnosis not present

## 2019-02-28 DIAGNOSIS — Z8739 Personal history of other diseases of the musculoskeletal system and connective tissue: Secondary | ICD-10-CM | POA: Diagnosis not present

## 2019-04-03 DIAGNOSIS — D649 Anemia, unspecified: Secondary | ICD-10-CM | POA: Diagnosis not present

## 2019-04-03 DIAGNOSIS — I10 Essential (primary) hypertension: Secondary | ICD-10-CM | POA: Diagnosis not present

## 2019-04-03 DIAGNOSIS — Z8739 Personal history of other diseases of the musculoskeletal system and connective tissue: Secondary | ICD-10-CM | POA: Diagnosis not present

## 2019-04-03 DIAGNOSIS — R600 Localized edema: Secondary | ICD-10-CM | POA: Diagnosis not present

## 2019-04-03 DIAGNOSIS — Z9189 Other specified personal risk factors, not elsewhere classified: Secondary | ICD-10-CM | POA: Diagnosis not present

## 2019-04-03 DIAGNOSIS — Z87898 Personal history of other specified conditions: Secondary | ICD-10-CM | POA: Diagnosis not present

## 2019-04-03 DIAGNOSIS — E871 Hypo-osmolality and hyponatremia: Secondary | ICD-10-CM | POA: Diagnosis not present

## 2019-04-03 DIAGNOSIS — Z6830 Body mass index (BMI) 30.0-30.9, adult: Secondary | ICD-10-CM | POA: Diagnosis not present

## 2019-04-03 DIAGNOSIS — E538 Deficiency of other specified B group vitamins: Secondary | ICD-10-CM | POA: Diagnosis not present

## 2019-04-03 DIAGNOSIS — Z0000 Encounter for general adult medical examination without abnormal findings: Secondary | ICD-10-CM | POA: Diagnosis not present
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
## 2019-09-26 IMAGING — CT CT HEAD W/O CM
4 series · 17 of 47 positions shown, 19 images · non-contrast
Comparison: None.

CLINICAL DATA: Altered level of consciousness

EXAM:
CT HEAD WITHOUT CONTRAST
TECHNIQUE: Contiguous axial images were obtained from the base of the skull
through the vertex without intravenous contrast.

[Series 3: head wo · axial · 0.43mm/px · z∈[-124,-4]mm · 7 of 34 slices shown, 9 images]
[im 5/34  brain]
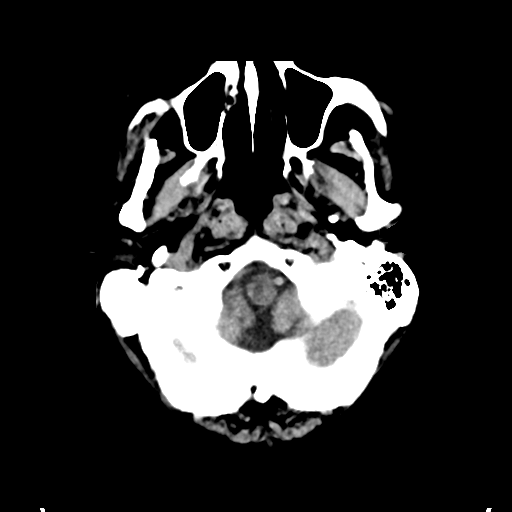
[im 5/34  bone]
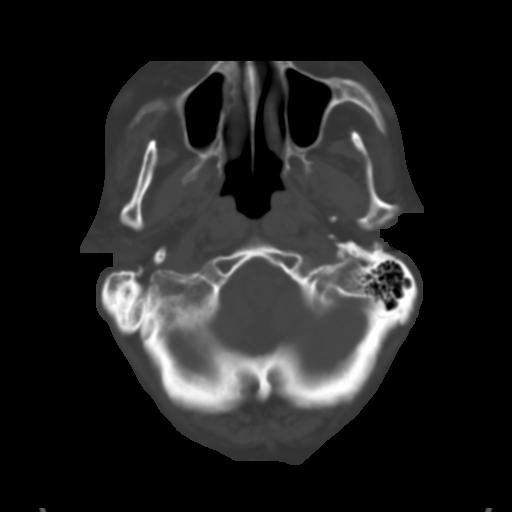
[im 9/34  brain]
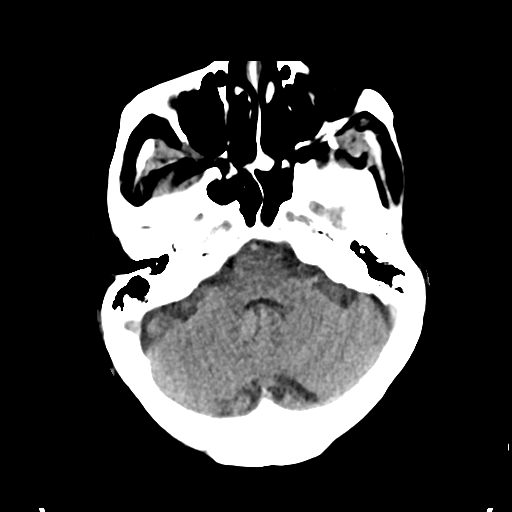
[im 13/34  brain]
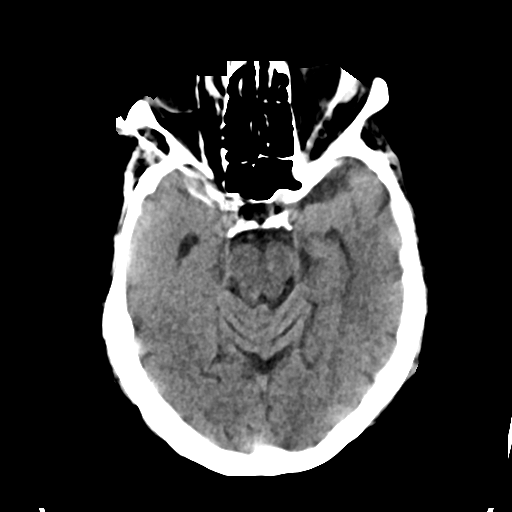
[im 17/34  brain]
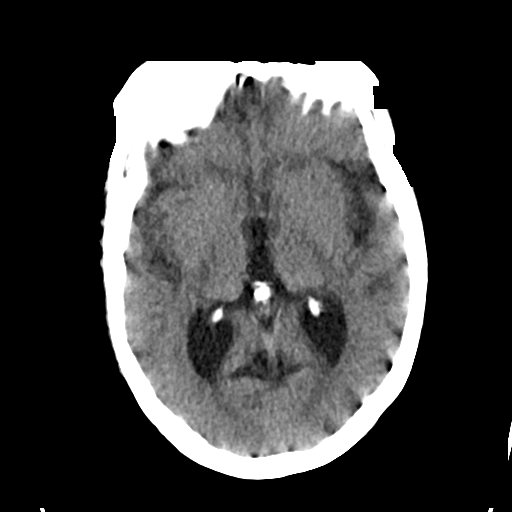
[im 21/34  brain]
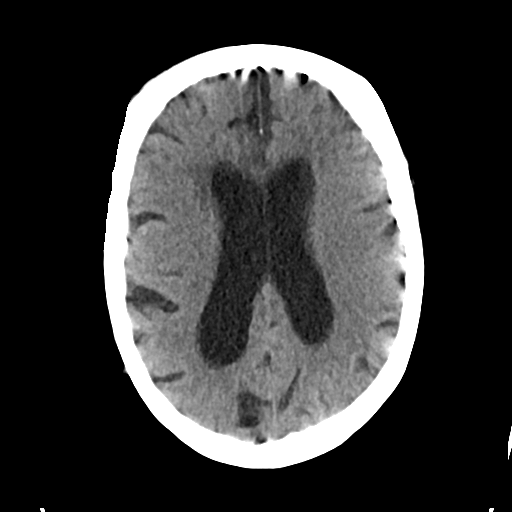
[im 21/34  bone]
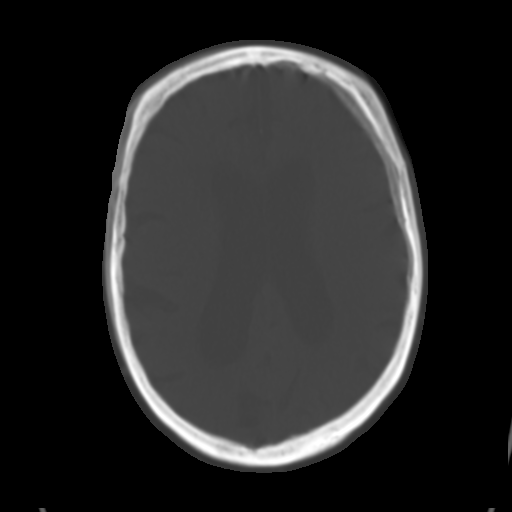
[im 25/34  brain]
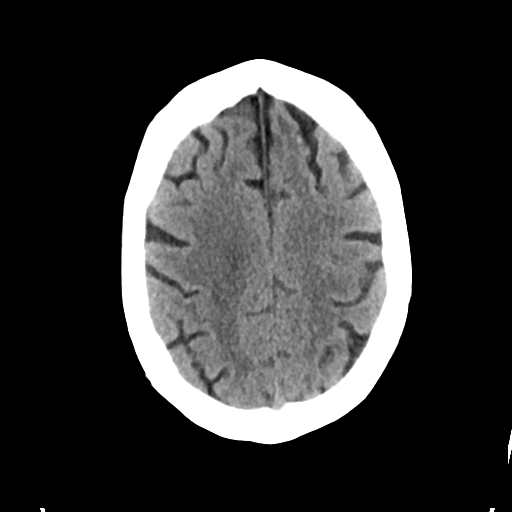
[im 29/34  brain]
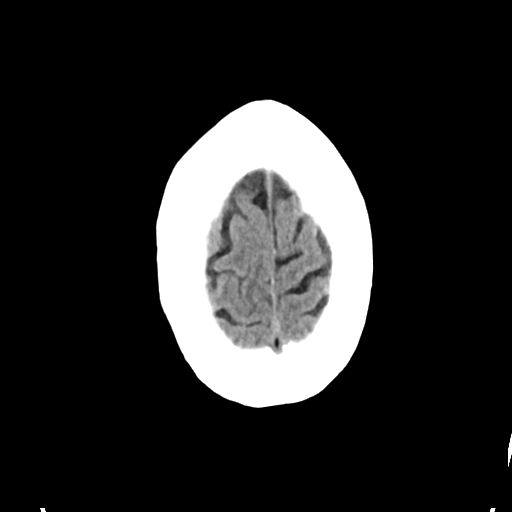

[Series 4: head bone · axial · 0.43mm/px · z∈[-128,-72]mm · 4 of 81 slices shown]
[im 9/81  bone]
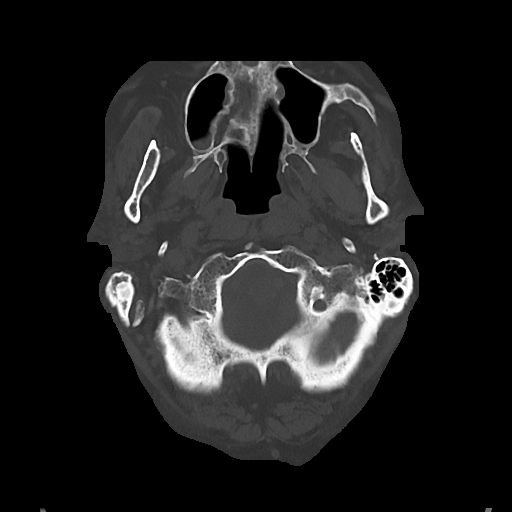
[im 17/81  bone]
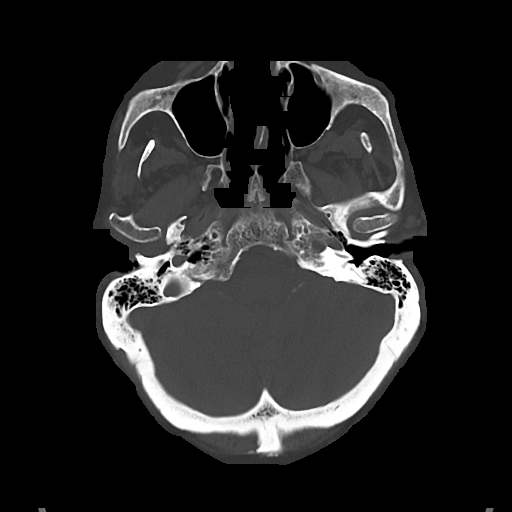
[im 25/81  bone]
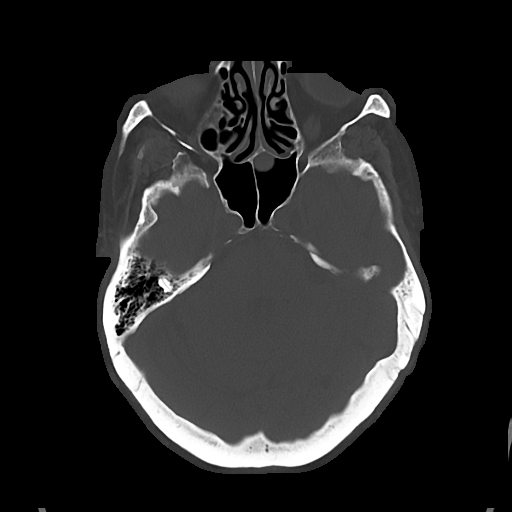
[im 37/81  bone]
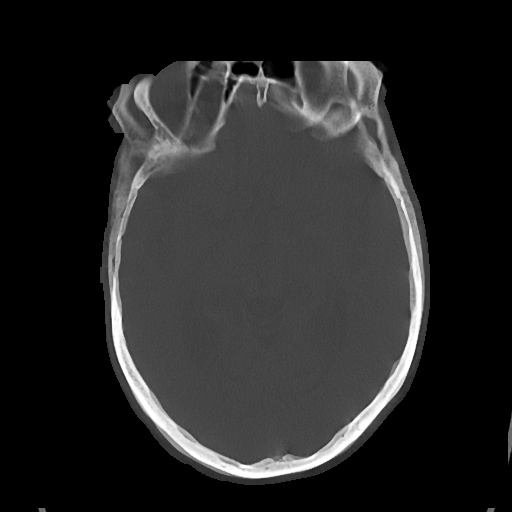

[Series 5: cor soft · coronal · 0.34mm/px · 3 of 74 slices shown]
[im 25/74  brain]
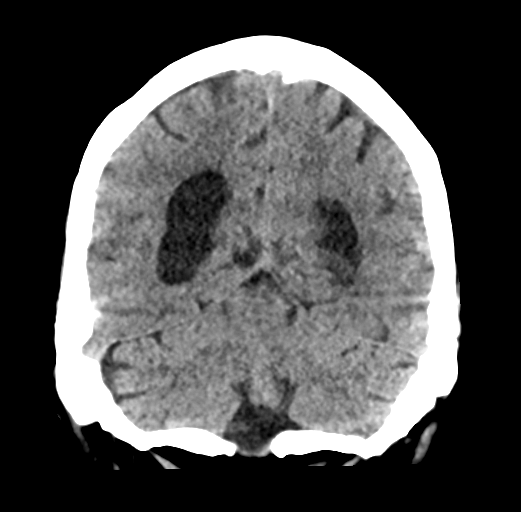
[im 33/74  brain]
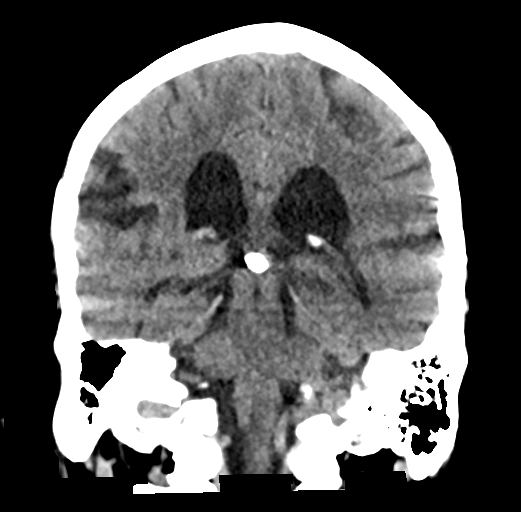
[im 41/74  brain]
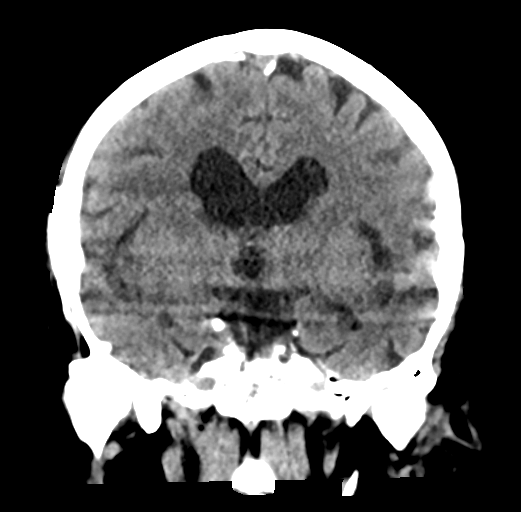

[Series 6: sag soft · sagittal · 0.34mm/px · 3 of 65 slices shown]
[im 22/65  brain]
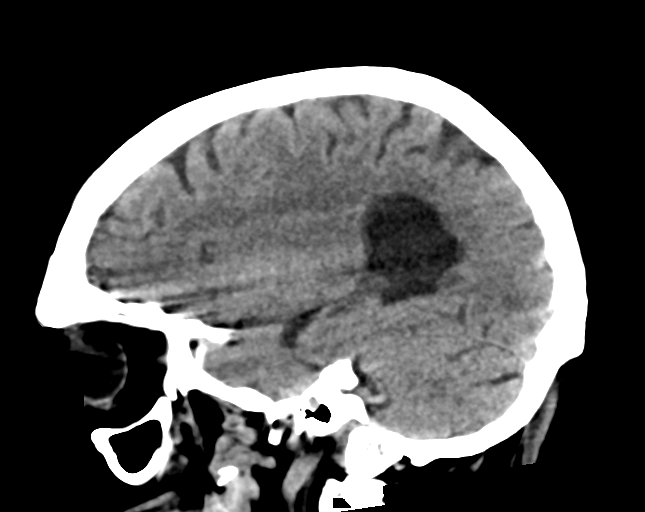
[im 33/65  brain]
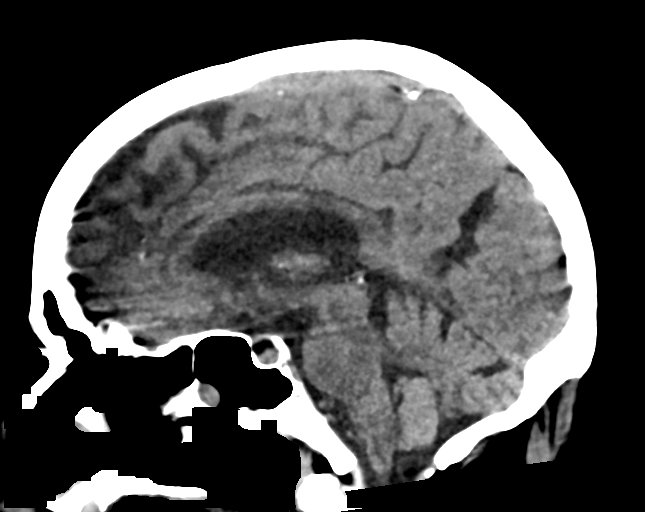
[im 43/65  brain]
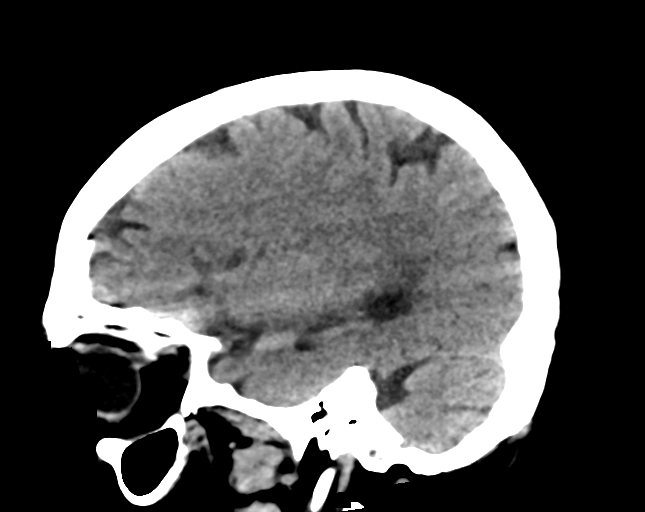

[17 of 47 positions shown; findings below may reference images not displayed]

FINDINGS: Brain: Motion degraded study. No gross hemorrhage or intracranial
mass is visualized. Moderate atrophy. Mild small vessel ischemic
changes of the white matter. Prominent ventricles likely due to
atrophy.

Vascular: No hyperdense vessels. Scattered calcifications at the
carotid siphons

Skull: No obvious fracture

Sinuses/Orbits: Mild mucosal thickening in the ethmoid sinuses and
maxillary sinuses. Mucous retention cysts in the left sphenoid
sinus. No definite acute orbital abnormality. Bilateral lens
extraction

Other: None
IMPRESSION: Motion degraded study. No gross acute intracranial abnormality.
Atrophy and small vessel ischemic changes of the white matter.

## 2019-10-18 DIAGNOSIS — I10 Essential (primary) hypertension: Secondary | ICD-10-CM | POA: Diagnosis not present

## 2019-10-18 DIAGNOSIS — D649 Anemia, unspecified: Secondary | ICD-10-CM | POA: Diagnosis not present

## 2019-10-18 DIAGNOSIS — E871 Hypo-osmolality and hyponatremia: Secondary | ICD-10-CM | POA: Diagnosis not present

## 2019-10-18 DIAGNOSIS — Z0000 Encounter for general adult medical examination without abnormal findings: Secondary | ICD-10-CM | POA: Diagnosis not present

## 2019-10-18 DIAGNOSIS — R413 Other amnesia: Secondary | ICD-10-CM | POA: Diagnosis not present

## 2019-10-18 DIAGNOSIS — R194 Change in bowel habit: Secondary | ICD-10-CM | POA: Diagnosis not present

## 2019-10-18 DIAGNOSIS — Z8739 Personal history of other diseases of the musculoskeletal system and connective tissue: Secondary | ICD-10-CM | POA: Diagnosis not present

## 2020-01-07 DIAGNOSIS — R194 Change in bowel habit: Secondary | ICD-10-CM | POA: Diagnosis not present

## 2020-01-07 DIAGNOSIS — D649 Anemia, unspecified: Secondary | ICD-10-CM | POA: Diagnosis not present

## 2020-10-23 DIAGNOSIS — Z136 Encounter for screening for cardiovascular disorders: Secondary | ICD-10-CM | POA: Diagnosis not present

## 2020-10-23 DIAGNOSIS — Z131 Encounter for screening for diabetes mellitus: Secondary | ICD-10-CM | POA: Diagnosis not present

## 2020-10-23 DIAGNOSIS — Z125 Encounter for screening for malignant neoplasm of prostate: Secondary | ICD-10-CM | POA: Diagnosis not present

## 2020-10-23 DIAGNOSIS — I10 Essential (primary) hypertension: Secondary | ICD-10-CM | POA: Diagnosis not present

## 2020-10-23 DIAGNOSIS — R413 Other amnesia: Secondary | ICD-10-CM | POA: Diagnosis not present

## 2020-10-23 DIAGNOSIS — E871 Hypo-osmolality and hyponatremia: Secondary | ICD-10-CM | POA: Diagnosis not present

## 2020-10-23 DIAGNOSIS — D649 Anemia, unspecified: Secondary | ICD-10-CM | POA: Diagnosis not present

## 2020-10-27 DIAGNOSIS — R413 Other amnesia: Secondary | ICD-10-CM | POA: Diagnosis not present

## 2020-10-27 DIAGNOSIS — I10 Essential (primary) hypertension: Secondary | ICD-10-CM | POA: Diagnosis not present

## 2020-10-27 DIAGNOSIS — Z131 Encounter for screening for diabetes mellitus: Secondary | ICD-10-CM | POA: Diagnosis not present

## 2020-10-27 DIAGNOSIS — E871 Hypo-osmolality and hyponatremia: Secondary | ICD-10-CM | POA: Diagnosis not present

## 2020-10-27 DIAGNOSIS — Z125 Encounter for screening for malignant neoplasm of prostate: Secondary | ICD-10-CM | POA: Diagnosis not present

## 2020-10-27 DIAGNOSIS — Z136 Encounter for screening for cardiovascular disorders: Secondary | ICD-10-CM | POA: Diagnosis not present

## 2020-10-27 DIAGNOSIS — D649 Anemia, unspecified: Secondary | ICD-10-CM | POA: Diagnosis not present

## 2020-10-30 DIAGNOSIS — E871 Hypo-osmolality and hyponatremia: Secondary | ICD-10-CM | POA: Diagnosis not present

## 2020-10-30 DIAGNOSIS — I77810 Thoracic aortic ectasia: Secondary | ICD-10-CM | POA: Diagnosis not present

## 2020-10-30 DIAGNOSIS — Z8739 Personal history of other diseases of the musculoskeletal system and connective tissue: Secondary | ICD-10-CM | POA: Diagnosis not present

## 2020-10-30 DIAGNOSIS — F1099 Alcohol use, unspecified with unspecified alcohol-induced disorder: Secondary | ICD-10-CM | POA: Diagnosis not present

## 2020-10-30 DIAGNOSIS — I10 Essential (primary) hypertension: Secondary | ICD-10-CM | POA: Diagnosis not present

## 2020-10-30 DIAGNOSIS — Z0000 Encounter for general adult medical examination without abnormal findings: Secondary | ICD-10-CM | POA: Diagnosis not present

## 2020-10-30 DIAGNOSIS — D649 Anemia, unspecified: Secondary | ICD-10-CM | POA: Diagnosis not present

## 2020-10-30 DIAGNOSIS — E538 Deficiency of other specified B group vitamins: Secondary | ICD-10-CM | POA: Diagnosis not present

## 2021-02-22 ENCOUNTER — Other Ambulatory Visit: Payer: Self-pay

## 2021-02-22 ENCOUNTER — Encounter: Payer: Self-pay | Admitting: Emergency Medicine

## 2021-02-22 ENCOUNTER — Emergency Department
Admission: EM | Admit: 2021-02-22 | Discharge: 2021-02-22 | Disposition: A | Discharge status: Home / Self Care | Payer: PPO | Attending: Emergency Medicine | Admitting: Emergency Medicine

## 2021-02-22 DIAGNOSIS — X102XXA Contact with fats and cooking oils, initial encounter: Secondary | ICD-10-CM | POA: Diagnosis not present

## 2021-02-22 DIAGNOSIS — T23391A Burn of third degree of multiple sites of right wrist and hand, initial encounter: Secondary | ICD-10-CM | POA: Insufficient documentation

## 2021-02-22 DIAGNOSIS — Z7982 Long term (current) use of aspirin: Secondary | ICD-10-CM | POA: Insufficient documentation

## 2021-02-22 DIAGNOSIS — Z79899 Other long term (current) drug therapy: Secondary | ICD-10-CM | POA: Insufficient documentation

## 2021-02-22 DIAGNOSIS — T23301A Burn of third degree of right hand, unspecified site, initial encounter: Secondary | ICD-10-CM

## 2021-02-22 DIAGNOSIS — I10 Essential (primary) hypertension: Secondary | ICD-10-CM | POA: Insufficient documentation

## 2021-02-22 DIAGNOSIS — T3110 Burns involving 10-19% of body surface with 0% to 9% third degree burns: Secondary | ICD-10-CM | POA: Diagnosis not present

## 2021-02-22 DIAGNOSIS — T23201A Burn of second degree of right hand, unspecified site, initial encounter: Secondary | ICD-10-CM

## 2021-02-22 DIAGNOSIS — S6991XA Unspecified injury of right wrist, hand and finger(s), initial encounter: Secondary | ICD-10-CM | POA: Diagnosis present

## 2021-02-22 MED ORDER — CEPHALEXIN 500 MG PO CAPS: 500.0000 mg | ORAL_CAPSULE | Freq: Three times a day (TID) | ORAL | 0 refills | Status: AC

## 2021-02-22 MED ORDER — LIDOCAINE HCL (PF) 1 % IJ SOLN
10.0000 mL | Freq: Once | INTRAMUSCULAR | Status: AC
  Administered 2021-02-22: 10 mL via INTRADERMAL
  Filled 2021-02-22: qty 10

## 2021-02-22 MED ORDER — LIDOCAINE-EPINEPHRINE-TETRACAINE (LET) TOPICAL GEL
6.0000 mL | Freq: Once | TOPICAL | Status: AC
  Administered 2021-02-22: 6 mL via TOPICAL
  Filled 2021-02-22: qty 6

## 2021-02-22 NOTE — ED Notes (Signed)
Patient's right hand placed in basin with NS solution per Catha Gosselin NP request.

## 2021-02-22 NOTE — ED Notes (Signed)
Right hand was dressed with Vaseline gauze, covered with sterile gauze and covered with stretcher tubing material. Patient tolerated procedure well.
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
## 2021-02-23 NOTE — ED Provider Notes (Signed)
Called patient to see if he was able to go to [facility] today. He tells me he has an appointment for tomorrow at 11:00. He is trying to arrange for someone to drive him. I again told him that if he cannot get there, he needs to come back to the hospital so we can make arrangements to get him there. He states that his hand is slightly swollen today compared to yesterday.  He is taking the antibiotics as prescribed.   Chinita Pester, FNP 02/23/21 Penni Homans, MD 02/24/21 [phone]

## 2021-02-24 DIAGNOSIS — X088XXA Exposure to other specified smoke, fire and flames, initial encounter: Secondary | ICD-10-CM | POA: Diagnosis not present

## 2021-02-24 DIAGNOSIS — T23201A Burn of second degree of right hand, unspecified site, initial encounter: Secondary | ICD-10-CM | POA: Diagnosis not present

## 2021-02-24 DIAGNOSIS — Z87891 Personal history of nicotine dependence: Secondary | ICD-10-CM | POA: Diagnosis not present

## 2021-02-24 DIAGNOSIS — Z20822 Contact with and (suspected) exposure to covid-19: Secondary | ICD-10-CM | POA: Diagnosis not present

## 2021-02-24 DIAGNOSIS — Z602 Problems related to living alone: Secondary | ICD-10-CM | POA: Diagnosis not present

## 2021-02-24 DIAGNOSIS — Z01818 Encounter for other preprocedural examination: Secondary | ICD-10-CM | POA: Diagnosis not present

## 2021-02-24 DIAGNOSIS — L03113 Cellulitis of right upper limb: Secondary | ICD-10-CM | POA: Diagnosis not present

## 2021-02-24 DIAGNOSIS — T23301A Burn of third degree of right hand, unspecified site, initial encounter: Secondary | ICD-10-CM | POA: Diagnosis not present

## 2021-02-24 DIAGNOSIS — Z043 Encounter for examination and observation following other accident: Secondary | ICD-10-CM | POA: Diagnosis not present

## 2021-02-24 DIAGNOSIS — I10 Essential (primary) hypertension: Secondary | ICD-10-CM | POA: Diagnosis not present

## 2021-02-24 DIAGNOSIS — T310 Burns involving less than 10% of body surface: Secondary | ICD-10-CM | POA: Diagnosis not present

## 2021-02-24 DIAGNOSIS — T23361A Burn of third degree of back of right hand, initial encounter: Secondary | ICD-10-CM | POA: Diagnosis not present

## 2021-02-26 DIAGNOSIS — T310 Burns involving less than 10% of body surface: Secondary | ICD-10-CM | POA: Diagnosis not present

## 2021-02-26 DIAGNOSIS — T23361A Burn of third degree of back of right hand, initial encounter: Secondary | ICD-10-CM | POA: Diagnosis not present

## 2021-03-05 DIAGNOSIS — I10 Essential (primary) hypertension: Secondary | ICD-10-CM | POA: Diagnosis not present

## 2021-03-05 DIAGNOSIS — T23361D Burn of third degree of back of right hand, subsequent encounter: Secondary | ICD-10-CM | POA: Diagnosis not present

## 2021-03-15 DIAGNOSIS — T23361D Burn of third degree of back of right hand, subsequent encounter: Secondary | ICD-10-CM | POA: Diagnosis not present

## 2021-03-15 DIAGNOSIS — I998 Other disorder of circulatory system: Secondary | ICD-10-CM | POA: Diagnosis not present

## 2021-03-15 DIAGNOSIS — T310 Burns involving less than 10% of body surface: Secondary | ICD-10-CM | POA: Diagnosis not present

## 2021-03-23 DIAGNOSIS — I10 Essential (primary) hypertension: Secondary | ICD-10-CM | POA: Diagnosis not present

## 2021-03-23 DIAGNOSIS — T23361D Burn of third degree of back of right hand, subsequent encounter: Secondary | ICD-10-CM | POA: Diagnosis not present

## 2021-04-06 DIAGNOSIS — I10 Essential (primary) hypertension: Secondary | ICD-10-CM | POA: Diagnosis not present

## 2021-04-06 DIAGNOSIS — T23361D Burn of third degree of back of right hand, subsequent encounter: Secondary | ICD-10-CM | POA: Diagnosis not present

## 2021-08-31 ENCOUNTER — Other Ambulatory Visit: Payer: Self-pay | Admitting: Physician Assistant

## 2021-08-31 ENCOUNTER — Ambulatory Visit
Admission: RE | Admit: 2021-08-31 | Discharge: 2021-08-31 | Disposition: A | Discharge status: Home / Self Care | Payer: PPO | Source: Ambulatory Visit | Attending: Physician Assistant | Admitting: Physician Assistant

## 2021-08-31 DIAGNOSIS — E538 Deficiency of other specified B group vitamins: Secondary | ICD-10-CM | POA: Diagnosis not present

## 2021-08-31 DIAGNOSIS — K625 Hemorrhage of anus and rectum: Secondary | ICD-10-CM | POA: Insufficient documentation

## 2021-08-31 DIAGNOSIS — R1032 Left lower quadrant pain: Secondary | ICD-10-CM | POA: Insufficient documentation

## 2021-08-31 DIAGNOSIS — K8020 Calculus of gallbladder without cholecystitis without obstruction: Secondary | ICD-10-CM | POA: Diagnosis not present

## 2021-08-31 DIAGNOSIS — K5730 Diverticulosis of large intestine without perforation or abscess without bleeding: Secondary | ICD-10-CM | POA: Diagnosis not present

## 2021-08-31 DIAGNOSIS — D649 Anemia, unspecified: Secondary | ICD-10-CM | POA: Diagnosis not present

## 2021-08-31 DIAGNOSIS — E871 Hypo-osmolality and hyponatremia: Secondary | ICD-10-CM | POA: Diagnosis not present

## 2021-08-31 DIAGNOSIS — I10 Essential (primary) hypertension: Secondary | ICD-10-CM | POA: Diagnosis not present

## 2021-08-31 DIAGNOSIS — N3289 Other specified disorders of bladder: Secondary | ICD-10-CM | POA: Diagnosis not present

## 2021-08-31 LAB — POCT I-STAT CREATININE: Creatinine, Ser: 1.3 mg/dL — ABNORMAL HIGH (ref 0.61–1.24)

## 2021-08-31 MED ORDER — IOHEXOL 300 MG/ML  SOLN
100.0000 mL | Freq: Once | INTRAMUSCULAR | Status: AC | PRN
  Administered 2021-08-31: 100 mL via INTRAVENOUS

## 2021-09-21 DIAGNOSIS — J3489 Other specified disorders of nose and nasal sinuses: Secondary | ICD-10-CM | POA: Diagnosis not present

## 2021-09-21 DIAGNOSIS — R262 Difficulty in walking, not elsewhere classified: Secondary | ICD-10-CM | POA: Diagnosis not present

## 2021-09-21 DIAGNOSIS — Z8739 Personal history of other diseases of the musculoskeletal system and connective tissue: Secondary | ICD-10-CM | POA: Diagnosis not present

## 2021-09-21 DIAGNOSIS — D649 Anemia, unspecified: Secondary | ICD-10-CM | POA: Diagnosis not present

## 2021-09-21 DIAGNOSIS — R634 Abnormal weight loss: Secondary | ICD-10-CM | POA: Diagnosis not present

## 2021-09-21 DIAGNOSIS — F1099 Alcohol use, unspecified with unspecified alcohol-induced disorder: Secondary | ICD-10-CM | POA: Diagnosis not present

## 2021-09-21 DIAGNOSIS — I7121 Aneurysm of the ascending aorta, without rupture: Secondary | ICD-10-CM | POA: Diagnosis not present

## 2021-09-21 DIAGNOSIS — Z0000 Encounter for general adult medical examination without abnormal findings: Secondary | ICD-10-CM | POA: Diagnosis not present

## 2021-09-21 DIAGNOSIS — Z789 Other specified health status: Secondary | ICD-10-CM | POA: Diagnosis not present

## 2021-09-21 DIAGNOSIS — K625 Hemorrhage of anus and rectum: Secondary | ICD-10-CM | POA: Diagnosis not present

## 2021-09-21 DIAGNOSIS — I10 Essential (primary) hypertension: Secondary | ICD-10-CM | POA: Diagnosis not present

## 2021-09-21 DIAGNOSIS — E871 Hypo-osmolality and hyponatremia: Secondary | ICD-10-CM | POA: Diagnosis not present

## 2022-03-02 DIAGNOSIS — M19041 Primary osteoarthritis, right hand: Secondary | ICD-10-CM | POA: Diagnosis not present

## 2022-03-02 DIAGNOSIS — S6991XA Unspecified injury of right wrist, hand and finger(s), initial encounter: Secondary | ICD-10-CM | POA: Diagnosis not present

## 2022-03-02 DIAGNOSIS — M85841 Other specified disorders of bone density and structure, right hand: Secondary | ICD-10-CM | POA: Diagnosis not present

## 2022-03-02 DIAGNOSIS — Z23 Encounter for immunization: Secondary | ICD-10-CM | POA: Diagnosis not present

## 2022-03-21 DIAGNOSIS — Z1212 Encounter for screening for malignant neoplasm of rectum: Secondary | ICD-10-CM | POA: Diagnosis not present

## 2022-03-21 DIAGNOSIS — Z1211 Encounter for screening for malignant neoplasm of colon: Secondary | ICD-10-CM | POA: Diagnosis not present

## 2022-04-26 DIAGNOSIS — E871 Hypo-osmolality and hyponatremia: Secondary | ICD-10-CM | POA: Diagnosis not present

## 2022-04-26 DIAGNOSIS — Z79899 Other long term (current) drug therapy: Secondary | ICD-10-CM | POA: Diagnosis not present

## 2022-04-26 DIAGNOSIS — D649 Anemia, unspecified: Secondary | ICD-10-CM | POA: Diagnosis not present

## 2022-04-26 DIAGNOSIS — Z125 Encounter for screening for malignant neoplasm of prostate: Secondary | ICD-10-CM | POA: Diagnosis not present

## 2022-04-26 DIAGNOSIS — Z8739 Personal history of other diseases of the musculoskeletal system and connective tissue: Secondary | ICD-10-CM | POA: Diagnosis not present

## 2022-04-26 DIAGNOSIS — I10 Essential (primary) hypertension: Secondary | ICD-10-CM | POA: Diagnosis not present

## 2022-04-26 DIAGNOSIS — K625 Hemorrhage of anus and rectum: Secondary | ICD-10-CM | POA: Diagnosis not present

## 2022-04-26 DIAGNOSIS — I7121 Aneurysm of the ascending aorta, without rupture: Secondary | ICD-10-CM | POA: Diagnosis not present

## 2022-04-26 DIAGNOSIS — J3489 Other specified disorders of nose and nasal sinuses: Secondary | ICD-10-CM | POA: Diagnosis not present

## 2022-05-03 DIAGNOSIS — Z0000 Encounter for general adult medical examination without abnormal findings: Secondary | ICD-10-CM | POA: Diagnosis not present

## 2022-05-03 DIAGNOSIS — F1099 Alcohol use, unspecified with unspecified alcohol-induced disorder: Secondary | ICD-10-CM | POA: Diagnosis not present

## 2022-05-03 DIAGNOSIS — I340 Nonrheumatic mitral (valve) insufficiency: Secondary | ICD-10-CM | POA: Diagnosis not present

## 2022-05-03 DIAGNOSIS — I7121 Aneurysm of the ascending aorta, without rupture: Secondary | ICD-10-CM | POA: Diagnosis not present

## 2022-05-03 DIAGNOSIS — B351 Tinea unguium: Secondary | ICD-10-CM | POA: Diagnosis not present

## 2022-05-03 DIAGNOSIS — N1831 Chronic kidney disease, stage 3a: Secondary | ICD-10-CM | POA: Diagnosis not present

## 2022-05-03 DIAGNOSIS — D649 Anemia, unspecified: Secondary | ICD-10-CM | POA: Diagnosis not present

## 2022-05-03 DIAGNOSIS — E871 Hypo-osmolality and hyponatremia: Secondary | ICD-10-CM | POA: Diagnosis not present

## 2022-05-03 DIAGNOSIS — F1090 Alcohol use, unspecified, uncomplicated: Secondary | ICD-10-CM | POA: Diagnosis not present

## 2022-05-03 DIAGNOSIS — M79609 Pain in unspecified limb: Secondary | ICD-10-CM | POA: Diagnosis not present

## 2022-05-03 DIAGNOSIS — I10 Essential (primary) hypertension: Secondary | ICD-10-CM | POA: Diagnosis not present

## 2022-05-18 DIAGNOSIS — R350 Frequency of micturition: Secondary | ICD-10-CM | POA: Diagnosis not present

## 2022-05-18 DIAGNOSIS — B356 Tinea cruris: Secondary | ICD-10-CM | POA: Diagnosis not present

## 2022-09-13 ENCOUNTER — Encounter (HOSPITAL_COMMUNITY)
Admission: EM | Disposition: A | Discharge status: Home Health | Payer: Self-pay | Source: Home / Self Care | Attending: Neurology

## 2022-09-13 ENCOUNTER — Emergency Department (HOSPITAL_COMMUNITY): Payer: 59

## 2022-09-13 ENCOUNTER — Other Ambulatory Visit: Payer: Self-pay

## 2022-09-13 ENCOUNTER — Emergency Department (HOSPITAL_COMMUNITY): Payer: 59 | Admitting: Certified Registered Nurse Anesthetist

## 2022-09-13 ENCOUNTER — Encounter (HOSPITAL_COMMUNITY): Payer: Self-pay | Admitting: Certified Registered Nurse Anesthetist

## 2022-09-13 ENCOUNTER — Inpatient Hospital Stay (HOSPITAL_COMMUNITY)
Admission: EM | Admit: 2022-09-13 | Discharge: 2022-09-17 | DRG: 024 | Disposition: A | Discharge status: Home Health | Payer: 59 | Attending: Neurology | Admitting: Neurology

## 2022-09-13 DIAGNOSIS — E871 Hypo-osmolality and hyponatremia: Secondary | ICD-10-CM | POA: Diagnosis present

## 2022-09-13 DIAGNOSIS — I495 Sick sinus syndrome: Secondary | ICD-10-CM | POA: Diagnosis present

## 2022-09-13 DIAGNOSIS — R531 Weakness: Secondary | ICD-10-CM

## 2022-09-13 DIAGNOSIS — Z91048 Other nonmedicinal substance allergy status: Secondary | ICD-10-CM

## 2022-09-13 DIAGNOSIS — I7121 Aneurysm of the ascending aorta, without rupture: Secondary | ICD-10-CM | POA: Diagnosis present

## 2022-09-13 DIAGNOSIS — M7981 Nontraumatic hematoma of soft tissue: Secondary | ICD-10-CM | POA: Diagnosis not present

## 2022-09-13 DIAGNOSIS — R471 Dysarthria and anarthria: Secondary | ICD-10-CM | POA: Diagnosis present

## 2022-09-13 DIAGNOSIS — Z79899 Other long term (current) drug therapy: Secondary | ICD-10-CM

## 2022-09-13 DIAGNOSIS — Z789 Other specified health status: Secondary | ICD-10-CM | POA: Diagnosis not present

## 2022-09-13 DIAGNOSIS — F1020 Alcohol dependence, uncomplicated: Secondary | ICD-10-CM | POA: Diagnosis present

## 2022-09-13 DIAGNOSIS — I129 Hypertensive chronic kidney disease with stage 1 through stage 4 chronic kidney disease, or unspecified chronic kidney disease: Secondary | ICD-10-CM | POA: Diagnosis present

## 2022-09-13 DIAGNOSIS — Z1152 Encounter for screening for COVID-19: Secondary | ICD-10-CM

## 2022-09-13 DIAGNOSIS — I63512 Cerebral infarction due to unspecified occlusion or stenosis of left middle cerebral artery: Principal | ICD-10-CM | POA: Diagnosis present

## 2022-09-13 DIAGNOSIS — G8191 Hemiplegia, unspecified affecting right dominant side: Secondary | ICD-10-CM | POA: Diagnosis present

## 2022-09-13 DIAGNOSIS — I6602 Occlusion and stenosis of left middle cerebral artery: Secondary | ICD-10-CM | POA: Diagnosis present

## 2022-09-13 DIAGNOSIS — N189 Chronic kidney disease, unspecified: Secondary | ICD-10-CM | POA: Diagnosis present

## 2022-09-13 DIAGNOSIS — I4891 Unspecified atrial fibrillation: Secondary | ICD-10-CM | POA: Diagnosis present

## 2022-09-13 DIAGNOSIS — I10 Essential (primary) hypertension: Secondary | ICD-10-CM

## 2022-09-13 DIAGNOSIS — R29723 NIHSS score 23: Secondary | ICD-10-CM | POA: Diagnosis present

## 2022-09-13 DIAGNOSIS — R4701 Aphasia: Secondary | ICD-10-CM | POA: Diagnosis present

## 2022-09-13 DIAGNOSIS — Z801 Family history of malignant neoplasm of trachea, bronchus and lung: Secondary | ICD-10-CM | POA: Diagnosis not present

## 2022-09-13 DIAGNOSIS — R29810 Facial weakness: Secondary | ICD-10-CM | POA: Diagnosis present

## 2022-09-13 DIAGNOSIS — M109 Gout, unspecified: Secondary | ICD-10-CM | POA: Diagnosis present

## 2022-09-13 DIAGNOSIS — I724 Aneurysm of artery of lower extremity: Secondary | ICD-10-CM | POA: Diagnosis not present

## 2022-09-13 DIAGNOSIS — Z7982 Long term (current) use of aspirin: Secondary | ICD-10-CM

## 2022-09-13 DIAGNOSIS — D500 Iron deficiency anemia secondary to blood loss (chronic): Secondary | ICD-10-CM | POA: Diagnosis present

## 2022-09-13 DIAGNOSIS — I639 Cerebral infarction, unspecified: Principal | ICD-10-CM

## 2022-09-13 HISTORY — PX: IR CT HEAD LTD: IMG2386

## 2022-09-13 HISTORY — PX: RADIOLOGY WITH ANESTHESIA: SHX6223

## 2022-09-13 HISTORY — PX: IR PERCUTANEOUS ART THROMBECTOMY/INFUSION INTRACRANIAL INC DIAG ANGIO: IMG6087

## 2022-09-13 HISTORY — PX: IR US GUIDE VASC ACCESS RIGHT: IMG2390

## 2022-09-13 LAB — COMPREHENSIVE METABOLIC PANEL
ALT: 10 U/L (ref 0–44)
AST: 19 U/L (ref 15–41)
Albumin: 3.5 g/dL (ref 3.5–5.0)
Alkaline Phosphatase: 82 U/L (ref 38–126)
Anion gap: 10 (ref 5–15)
BUN: 28 mg/dL — ABNORMAL HIGH (ref 8–23)
CO2: 18 mmol/L — ABNORMAL LOW (ref 22–32)
Calcium: 8.9 mg/dL (ref 8.9–10.3)
Chloride: 102 mmol/L (ref 98–111)
Creatinine, Ser: 1.5 mg/dL — ABNORMAL HIGH (ref 0.61–1.24)
GFR, Estimated: 47 mL/min — ABNORMAL LOW (ref >60)
Glucose, Bld: 105 mg/dL — ABNORMAL HIGH (ref 70–99)
Potassium: 4.6 mmol/L (ref 3.5–5.1)
Sodium: 130 mmol/L — ABNORMAL LOW (ref 135–145)
Total Bilirubin: 0.7 mg/dL (ref 0.3–1.2)
Total Protein: 6.9 g/dL (ref 6.5–8.1)

## 2022-09-13 LAB — DIFFERENTIAL
Abs Immature Granulocytes: 0.03 10*3/uL (ref 0.00–0.07)
Basophils Absolute: 0 10*3/uL (ref 0.0–0.1)
Basophils Relative: 1 %
Eosinophils Absolute: 0 10*3/uL (ref 0.0–0.5)
Eosinophils Relative: 1 %
Immature Granulocytes: 1 %
Lymphocytes Relative: 14 %
Lymphs Abs: 0.7 10*3/uL (ref 0.7–4.0)
Monocytes Absolute: 0.6 10*3/uL (ref 0.1–1.0)
Monocytes Relative: 12 %
Neutro Abs: 3.9 10*3/uL (ref 1.7–7.7)
Neutrophils Relative %: 71 %

## 2022-09-13 LAB — CBC
HCT: 38.5 % — ABNORMAL LOW (ref 39.0–52.0)
Hemoglobin: 12.1 g/dL — ABNORMAL LOW (ref 13.0–17.0)
MCH: 31.8 pg (ref 26.0–34.0)
MCHC: 31.4 g/dL (ref 30.0–36.0)
MCV: 101.3 fL — ABNORMAL HIGH (ref 80.0–100.0)
Platelets: 145 10*3/uL — ABNORMAL LOW (ref 150–400)
RBC: 3.8 MIL/uL — ABNORMAL LOW (ref 4.22–5.81)
RDW: 16.3 % — ABNORMAL HIGH (ref 11.5–15.5)
WBC: 5.3 10*3/uL (ref 4.0–10.5)
nRBC: 0 % (ref 0.0–0.2)

## 2022-09-13 LAB — I-STAT CHEM 8, ED
BUN: 37 mg/dL — ABNORMAL HIGH (ref 8–23)
Calcium, Ion: 1.06 mmol/L — ABNORMAL LOW (ref 1.15–1.40)
Chloride: 103 mmol/L (ref 98–111)
Creatinine, Ser: 1.4 mg/dL — ABNORMAL HIGH (ref 0.61–1.24)
Glucose, Bld: 98 mg/dL (ref 70–99)
HCT: 37 % — ABNORMAL LOW (ref 39.0–52.0)
Hemoglobin: 12.6 g/dL — ABNORMAL LOW (ref 13.0–17.0)
Potassium: 5.2 mmol/L — ABNORMAL HIGH (ref 3.5–5.1)
Sodium: 132 mmol/L — ABNORMAL LOW (ref 135–145)
TCO2: 20 mmol/L — ABNORMAL LOW (ref 22–32)

## 2022-09-13 LAB — PROTIME-INR
INR: 1.3 — ABNORMAL HIGH (ref 0.8–1.2)
Prothrombin Time: 16.1 seconds — ABNORMAL HIGH (ref 11.4–15.2)

## 2022-09-13 LAB — RESP PANEL BY RT-PCR (RSV, FLU A&B, COVID)  RVPGX2
Influenza A by PCR: NEGATIVE
Influenza B by PCR: NEGATIVE
Resp Syncytial Virus by PCR: NEGATIVE
SARS Coronavirus 2 by RT PCR: NEGATIVE

## 2022-09-13 LAB — CBG MONITORING, ED: Glucose-Capillary: 104 mg/dL — ABNORMAL HIGH (ref 70–99)

## 2022-09-13 LAB — APTT: aPTT: 33 seconds (ref 24–36)

## 2022-09-13 LAB — ETHANOL: Alcohol, Ethyl (B): <10 mg/dL (ref <10)

## 2022-09-13 SURGERY — IR WITH ANESTHESIA
Anesthesia: General

## 2022-09-13 MED ORDER — CLEVIDIPINE BUTYRATE 0.5 MG/ML IV EMUL
INTRAVENOUS | Status: AC
  Filled 2022-09-13: qty 50

## 2022-09-13 MED ORDER — NITROGLYCERIN 1 MG/10 ML FOR IR/CATH LAB
INTRA_ARTERIAL | Status: AC
  Filled 2022-09-13: qty 10

## 2022-09-13 MED ORDER — ORAL CARE MOUTH RINSE: 15.0000 mL | OROMUCOSAL | Status: DC | PRN

## 2022-09-13 MED ORDER — IOHEXOL 350 MG/ML SOLN
100.0000 mL | Freq: Once | INTRAVENOUS | Status: AC | PRN
  Administered 2022-09-13: 100 mL via INTRAVENOUS

## 2022-09-13 MED ORDER — IOHEXOL 300 MG/ML  SOLN
150.0000 mL | Freq: Once | INTRAMUSCULAR | Status: AC | PRN
  Administered 2022-09-13: 30 mL via INTRA_ARTERIAL

## 2022-09-13 MED ORDER — IOHEXOL 350 MG/ML SOLN
75.0000 mL | Freq: Once | INTRAVENOUS | Status: AC | PRN
  Administered 2022-09-13: 75 mL via INTRAVENOUS

## 2022-09-13 MED ORDER — IOHEXOL 300 MG/ML  SOLN
150.0000 mL | Freq: Once | INTRAMUSCULAR | Status: AC | PRN
  Administered 2022-09-13: 75 mL via INTRA_ARTERIAL

## 2022-09-13 MED ORDER — STROKE: EARLY STAGES OF RECOVERY BOOK
Freq: Once | Status: AC
  Filled 2022-09-13: qty 1

## 2022-09-13 MED ORDER — SUGAMMADEX SODIUM 200 MG/2ML IV SOLN
INTRAVENOUS | Status: DC | PRN
  Administered 2022-09-13: 200 mg via INTRAVENOUS

## 2022-09-13 MED ORDER — VERAPAMIL HCL 2.5 MG/ML IV SOLN
INTRAVENOUS | Status: DC | PRN
  Administered 2022-09-13: 2.5 mg via INTRA_ARTERIAL

## 2022-09-13 MED ORDER — ROCURONIUM BROMIDE 10 MG/ML (PF) SYRINGE
PREFILLED_SYRINGE | INTRAVENOUS | Status: DC | PRN
  Administered 2022-09-13: 20 mg via INTRAVENOUS
  Administered 2022-09-13: 50 mg via INTRAVENOUS
  Administered 2022-09-13: 10 mg via INTRAVENOUS

## 2022-09-13 MED ORDER — VERAPAMIL HCL 2.5 MG/ML IV SOLN
INTRAVENOUS | Status: AC
  Filled 2022-09-13: qty 2

## 2022-09-13 MED ORDER — SODIUM CHLORIDE 0.9 % IV SOLN: INTRAVENOUS | Status: DC

## 2022-09-13 MED ORDER — ACETAMINOPHEN 325 MG PO TABS: 650.0000 mg | ORAL_TABLET | ORAL | Status: DC | PRN

## 2022-09-13 MED ORDER — FENTANYL CITRATE (PF) 100 MCG/2ML IJ SOLN
INTRAMUSCULAR | Status: AC
  Filled 2022-09-13: qty 2

## 2022-09-13 MED ORDER — PHENYLEPHRINE HCL-NACL 20-0.9 MG/250ML-% IV SOLN
INTRAVENOUS | Status: DC | PRN
  Administered 2022-09-13: 80 ug via INTRAVENOUS
  Administered 2022-09-13: 25 ug/min via INTRAVENOUS

## 2022-09-13 MED ORDER — ACETAMINOPHEN 160 MG/5ML PO SOLN: 650.0000 mg | ORAL | Status: DC | PRN

## 2022-09-13 MED ORDER — SODIUM CHLORIDE 0.9 % IV SOLN: INTRAVENOUS | Status: DC | PRN

## 2022-09-13 MED ORDER — HEPARIN SODIUM (PORCINE) 1000 UNIT/ML IJ SOLN
INTRAMUSCULAR | Status: DC | PRN
  Administered 2022-09-13: 2000 [IU] via INTRAVENOUS

## 2022-09-13 MED ORDER — EPTIFIBATIDE 20 MG/10ML IV SOLN: INTRAVENOUS | Status: DC | PRN

## 2022-09-13 MED ORDER — SENNOSIDES-DOCUSATE SODIUM 8.6-50 MG PO TABS: 1.0000 | ORAL_TABLET | Freq: Every evening | ORAL | Status: DC | PRN

## 2022-09-13 MED ORDER — CLEVIDIPINE BUTYRATE 0.5 MG/ML IV EMUL
0.0000 mg/h | INTRAVENOUS | Status: AC
  Administered 2022-09-14 (×2): 30 mg/h via INTRAVENOUS
  Administered 2022-09-14: 26 mg/h via INTRAVENOUS
  Administered 2022-09-14: 28 mg/h via INTRAVENOUS
  Administered 2022-09-14: 12 mg/h via INTRAVENOUS
  Administered 2022-09-14: 22 mg/h via INTRAVENOUS
  Administered 2022-09-14 (×2): 20 mg/h via INTRAVENOUS
  Administered 2022-09-14: 28 mg/h via INTRAVENOUS
  Administered 2022-09-14: 10 mg/h via INTRAVENOUS
  Filled 2022-09-13 (×3): qty 100
  Filled 2022-09-13: qty 200
  Filled 2022-09-13 (×6): qty 100
  Filled 2022-09-13: qty 50

## 2022-09-13 MED ORDER — CEFAZOLIN SODIUM-DEXTROSE 2-3 GM-%(50ML) IV SOLR
INTRAVENOUS | Status: DC | PRN
  Administered 2022-09-13: 2 g via INTRAVENOUS

## 2022-09-13 MED ORDER — IOHEXOL 300 MG/ML  SOLN
50.0000 mL | Freq: Once | INTRAMUSCULAR | Status: AC | PRN
  Administered 2022-09-13: 40 mL via INTRA_ARTERIAL

## 2022-09-13 MED ORDER — HEPARIN SODIUM (PORCINE) 1000 UNIT/ML IJ SOLN
INTRAMUSCULAR | Status: AC
  Filled 2022-09-13: qty 10

## 2022-09-13 MED ORDER — CLEVIDIPINE BUTYRATE 0.5 MG/ML IV EMUL
INTRAVENOUS | Status: DC | PRN
  Administered 2022-09-13: 2 mg/h via INTRAVENOUS

## 2022-09-13 MED ORDER — ACETAMINOPHEN 650 MG RE SUPP: 650.0000 mg | RECTAL | Status: DC | PRN

## 2022-09-13 MED ORDER — SUCCINYLCHOLINE CHLORIDE 200 MG/10ML IV SOSY
PREFILLED_SYRINGE | INTRAVENOUS | Status: DC | PRN
  Administered 2022-09-13: 120 mg via INTRAVENOUS

## 2022-09-13 MED ORDER — SODIUM CHLORIDE (PF) 0.9 % IJ SOLN
INTRAVENOUS | Status: DC | PRN
  Administered 2022-09-13: 100 ug via INTRA_ARTERIAL

## 2022-09-13 MED ORDER — FENTANYL CITRATE (PF) 250 MCG/5ML IJ SOLN
INTRAMUSCULAR | Status: DC | PRN
  Administered 2022-09-13 (×2): 25 ug via INTRAVENOUS
  Administered 2022-09-13: 50 ug via INTRAVENOUS

## 2022-09-13 MED ORDER — CEFAZOLIN SODIUM-DEXTROSE 2-4 GM/100ML-% IV SOLN
INTRAVENOUS | Status: AC
  Filled 2022-09-13: qty 100

## 2022-09-13 MED ORDER — PROPOFOL 10 MG/ML IV BOLUS
INTRAVENOUS | Status: DC | PRN
  Administered 2022-09-13: 100 mg via INTRAVENOUS

## 2022-09-13 MED ORDER — CHLORHEXIDINE GLUCONATE CLOTH 2 % EX PADS
6.0000 | MEDICATED_PAD | Freq: Every day | CUTANEOUS | Status: DC
  Administered 2022-09-14 – 2022-09-16 (×4): 6 via TOPICAL

## 2022-09-13 NOTE — Anesthesia Preprocedure Evaluation (Addendum)
Anesthesia Evaluation  Patient identified by MRN, date of birth, ID band Patient confused    Reviewed: Allergy & Precautions, NPO status , Patient's Chart, lab work & pertinent test results, reviewed documented beta blocker date and time , Unable to perform ROS - Chart review onlyPreop documentation limited or incomplete due to emergent nature of procedure.  Airway Mallampati: Unable to assess       Dental  (+) Edentulous Upper, Edentulous Lower   Pulmonary neg pulmonary ROS    + decreased breath sounds      Cardiovascular hypertension, Pt. on home beta blockers and Pt. on medications  Rhythm:Irregular Rate:Abnormal     Neuro/Psych negative neurological ROS  negative psych ROS   GI/Hepatic negative GI ROS, Neg liver ROS,,,  Endo/Other  negative endocrine ROS    Renal/GU Renal disease     Musculoskeletal negative musculoskeletal ROS (+)    Abdominal Normal abdominal exam  (+)   Peds  Hematology negative hematology ROS (+)   Anesthesia Other Findings   Reproductive/Obstetrics                             Anesthesia Physical Anesthesia Plan  ASA: 4 and emergent  Anesthesia Plan: General   Post-op Pain Management: Minimal or no pain anticipated   Induction: Intravenous  PONV Risk Score and Plan: 2 and Ondansetron and Treatment may vary due to age or medical condition  Airway Management Planned: Oral ETT  Additional Equipment: Arterial line  Intra-op Plan:   Post-operative Plan: Possible Post-op intubation/ventilation  Informed Consent: I have reviewed the patients History and Physical, chart, labs and discussed the procedure including the risks, benefits and alternatives for the proposed anesthesia with the patient or authorized representative who has indicated his/her understanding and acceptance.     Only emergency history available and History available from chart only  Plan  Discussed with: CRNA  Anesthesia Plan Comments:        Anesthesia Quick Evaluation

## 2022-09-13 NOTE — Consult Note (Deleted)
NEUROLOGY H&P   Date of service: Sep 13, 2022 Patient Name: [name] MRN:  [id number] DOB:  August 20, 1940 Reason for consult: stroke code Requesting physician: Dr. Glyn Greene _ _ _   _ __   _ __ _ _  __ __   _ __   __ _  History of Present Illness   This is an 82 yo man with hx HTN, ascending aortic aneurysm, EtOH abuse per chart review who is BIB EMS with R hemiplegia and aphasia. LKW 1330 when he left the house to drive himself to a doctor's appt. I was unable to reach wife or daughter by phone but per EMS daughter said she noticed these symptoms when he returned home at 1530 and they continued to get worse. NIHSS on arrival 23 with R hemiplegia and global aphasia. Head CT noncon personal review showed no acute process, ASPECTS 10. Patient was not a TNK candidate 2/2 presentation outside the window. CTA H&N was performed which showed a thrombus causing occlusion / near-occlusion of the proximal L M2. Case was discussed with Dr. Corliss Greene of neuro IR and we agreed to call code IR for mechnical thrombectomy. This will be done under 2 physician emergency consent 2/2 patient's inability to consent for himself and inability to get in touch with any family by phone.   After scan was completed patient's R sided weakness and aphasia acutely improved signifcantly, NIHSS 23 --> 4. Repeat CTA showed unchanged L proximal M2 near-occlusion. CTP showed 47 ml penumbra and 12 ml core in L MCA inferior division territory.   Given patient's improvement in exam he was now able to consent himself. Dr. Corliss Greene and I recommended proceeding with thrombectomy bc his exam improvement was felt to be 2/2 collaterals opening up but occlusion was unchanged and he had a significant associated perfusion deficit. I explained the risks, benefits, and alternatives to thrombectomy to the patient (risks incl 10% risk of intracranial hemorrhage) and he gave informed consent that he wished to proceed. Patient was then taken  for thrombectomy as a code IR.  Patient was in a fib on arrival to ED, this is a new dx. He is not on [facility].   ROS   Initially UTA 2/2 aphasia, subsequently ROS reviewed and was neg except per HPI  Past History   I have reviewed the following:  Past Medical History:  Diagnosis Date   Abnormal EKG    Alcoholism (HCC)    Ascending aortic aneurysm    Edema of both legs    Gout    History of palpitations    Hypertension    Personal history of gout    Past Surgical History:  Procedure Laterality Date   incision of left neck     Family History  Problem Relation Age of Onset   Lung cancer Father    Social History   Socioeconomic History   Marital status: Married    Spouse name: Not on file   Number of children: Not on file   Years of education: Not on file   Highest education level: Not on file  Occupational History   Not on file  Tobacco Use   Smoking status: Never   Smokeless tobacco: Never  Substance and Sexual Activity   Alcohol use: Yes    Alcohol/week: 21.0 standard drinks of alcohol    Types: 21 Cans of beer per week   Drug use: No   Sexual activity: Not on file  Other Topics Concern  Not on file  Social History Narrative   Not on file   Social Determinants of Health   Financial Resource Strain: Not on file  Food Insecurity: Not on file  Transportation Needs: Not on file  Physical Activity: Not on file  Stress: Not on file  Social Connections: Not on file   Allergies  Allergen Reactions   No Known Allergies     Medications   (Not in a hospital admission)    No current facility-administered medications for this encounter.  Current Outpatient Medications:    allopurinol (ZYLOPRIM) 300 MG tablet, Take 1 tablet (300 mg total) by mouth daily., Disp: 90 tablet, Rfl: 3   aspirin EC 81 MG tablet, Take 81 mg by mouth daily., Disp: , Rfl:    chlorthalidone (HYGROTON) 25 MG tablet, Take 25 mg by mouth daily. , Disp: , Rfl:    colchicine 0.6 MG  tablet, Take 0.6 mg by mouth 2 (two) times daily as needed (gout flares). , Disp: , Rfl:    cyanocobalamin 500 MCG tablet, Take 500 mcg by mouth daily. Vitamin B12, Disp: , Rfl:    diphenhydrAMINE (BENADRYL) 25 MG tablet, Take 25 mg by mouth at bedtime as needed for sleep., Disp: , Rfl:    folic acid (FOLVITE) 1 MG tablet, Take 1 tablet (1 mg total) by mouth daily., Disp: 30 tablet, Rfl: 0   furosemide (LASIX) 20 MG tablet, Take 1 tablet (20 mg total) by mouth daily., Disp: 90 tablet, Rfl: 4   Ginseng 100 MG CAPS, Take 100 mg by mouth daily., Disp: , Rfl:    ketoconazole (NIZORAL) 2 % cream, Apply 1 application topically 2 (two) times daily as needed (rash). , Disp: , Rfl:    lisinopril (PRINIVIL,ZESTRIL) 30 MG tablet, Take 1 tablet (30 mg total) by mouth daily., Disp: 90 tablet, Rfl: 4   metoprolol tartrate (LOPRESSOR) 50 MG tablet, Take 25 mg by mouth daily. , Disp: , Rfl:    Multiple Vitamin (MULTIVITAMIN WITH MINERALS) TABS tablet, Take 1 tablet by mouth daily., Disp: 30 tablet, Rfl: 0   naproxen (NAPROSYN) 500 MG tablet, Take 500 mg by mouth 2 (two) times daily as needed (pain). , Disp: , Rfl:    OVER THE COUNTER MEDICATION, Take 1 tablet by mouth daily. Super Glucosamine 2000 plus - glucosamine, MSM, Boron, Silica and Hyaluronic Acid, Vitamin C, Manganese, Sodium, Disp: , Rfl:    sildenafil (VIAGRA) 100 MG tablet, Take 0.5-1 tablets (50-100 mg total) by mouth daily as needed for erectile dysfunction., Disp: 15 tablet, Rfl: 6   thiamine 100 MG tablet, Take 1 tablet (100 mg total) by mouth daily., Disp: 30 tablet, Rfl: 0   tiZANidine (ZANAFLEX) 4 MG tablet, Take 1 tablet (4 mg total) by mouth daily as needed for muscle spasms., Disp: 90 tablet, Rfl: 3  Vitals   Vitals:   09/13/22 1800  Weight: 83.6 kg     Body mass index is 25 kg/m.  Physical Exam   Exam on arrival to ED:  Physical: Gen: alert, able to answer first name only but no other orientation questions, not following most  simple commands CV: irregularly irregular Resp: CTAB  Neuro: *MS: alert, able to answer first name only but no other orientation questions, not following most simple commands *Speech: severe global aphasia, moderate dysarthria *CN:    I: Deferred   II,III: PERRLA, blinks to threat on L but not R, optic discs unable to be visualized 2/2 pupillary constriction   III,IV,VI: EOMI  w/o nystagmus, no ptosis   V: Sensation intact from V1 to V3 to LT   VII: Eyelid closure was full.  R UMN facial droop   VIII: Hearing intact to voice   IX,X: Voice normal, palate elevates symmetrically    XI: SCM/trap 5/5 bilat   XII: Tongue protrudes midline, no atrophy or fasciculations  *Motor:   Normal bulk.  No tremor, rigidity or bradykinesia. No movement RUE. No movement against gravity RLE. LUE and LLE full strength. *Sensory: severe impairment R side (insensate) *Coordination:  UTA 2/2 difficulty following commands *Reflexes:  2+ and symmetric throughout without clonus; toes down-going bilat *Gait: deferred  NIHSS  1a Level of Conscious.: 0 1b LOC Questions: 2 1c LOC Commands: 2 2 Best Gaze: 2 3 Visual: 2 4 Facial Palsy: 1 5a Motor Arm - left: 0 5b Motor Arm - Right: 4 6a Motor Leg - Left: 2 6b Motor Leg - Right: 3 7 Limb Ataxia: 0 8 Sensory: 1 9 Best Language: 2 10 Dysarthria: 2 11 Extinct. and Inatten.: 0  TOTAL: 23  After first CTA completed patient improved significantly to NIHSS of 4 (1 RUE drift, 2 sensory, 1 dysarthria)   Premorbid mRS = 1   Labs   CBC:  Recent Labs  Lab 09/13/22 1849  HGB 12.6*  HCT 37.0*    Basic Metabolic Panel:  Lab Results  Component Value Date   NA 132 (L) 09/13/2022   K 5.2 (H) 09/13/2022   CO2 23 01/29/2017   GLUCOSE 98 09/13/2022   BUN 37 (H) 09/13/2022   CREATININE 1.40 (H) 09/13/2022   CALCIUM 8.5 (L) 01/29/2017   GFRNONAA >60 01/29/2017   GFRAA >60 01/29/2017   Lipid Panel: No results found for: "LDLCALC" HgbA1c: No results  found for: "HGBA1C" Urine Drug Screen:     Component Value Date/Time   LABOPIA NONE DETECTED 01/27/2017 1722   COCAINSCRNUR NONE DETECTED 01/27/2017 1722   LABBENZ NONE DETECTED 01/27/2017 1722   AMPHETMU NONE DETECTED 01/27/2017 1722   THCU NONE DETECTED 01/27/2017 1722   LABBARB NONE DETECTED 01/27/2017 1722    Alcohol Level     Component Value Date/Time   ETH <10 01/27/2017 1614    I personally reviewed the following imaging and discussed results by phone with neuroradiology  Head CT wo - no acute process, ASPECTS 10 CTA H&N - thrombus causing occlusion / near-occlusion of the proximal L M2 Repeat CTA H&N after improvement in exam - unchanged L proximal M2 near-occlusion CTP - 47 ml penumbra and 12 ml core in L MCA inferior division territory   Impression   This is an 82 yo man with hx HTN, ascending aortic aneurysm, EtOH abuse per chart review who was BIB EMS with R hemiplegia and aphasia (NIHSS 23) and was found to be having a 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 Etiology is embolic in setting of undiagnosed a fib.  After scan was completed patient's R sided weakness and aphasia acutely improved signifcantly, NIHSS 23 --> 4. Repeat CTA showed unchanged L proximal M2 near-occlusion. CTP showed 47 ml penumbra and 12 ml core in L MCA inferior division territory.   Given patient's improvement in exam he was now able to consent himself. Dr. Corliss Greene and I recommended proceeding with thrombectomy bc his exam improvement was felt to be 2/2 collaterals opening up but occlusion was unchanged and he had a significant associated perfusion deficit. I explained the risks, benefits, and alternatives to thrombectomy to  the patient (risks incl 10% risk of intracranial hemorrhage) and he gave informed consent that he wished to proceed. Patient was then taken for thrombectomy as a code IR.  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 Selina Cooley - BP parameters per IR, clevidipine gtt prn - MRI brain wo contrast - TTE - Check A1c and LDL + add statin per guidelines - Start antiplatelet tomorrow if no hemorrhage on post-procedure CT - Start pharmacologic DVT prophylaxis post-procedure if no hemorrhage on post-procedure CT - q4 hr neuro checks - STAT head CT for any change in neuro exam - Tele - PT/OT/SLP when able to participate - Stroke education   # A fib on arrival to ED, not previously diagnosed - Tele - Cardiology consult tomorrow - Hold anticoagulation in the setting of acute ischemic stroke (but [facility] will be indicated after the acute post stroke period)  # HTN - Hold home antihypertensives given need for strict BP control in setting of acute stroke (see above)  This patient is critically ill and at significant risk of neurological worsening, death and care requires constant monitoring of vital signs, hemodynamics,respiratory and cardiac monitoring, neurological assessment, discussion with family, other specialists and medical decision making of high complexity. I spent 100 minutes of neurocritical care time  in the care of  this patient. This was time spent independent of any time provided by nurse practitioner or PA.  Bing Neighbors, MD [facility] [phone]  If 7pm- 7am, please page neurology on call as listed in AMION.
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
## 2022-09-13 NOTE — Transfer of Care (Signed)
Immediate Anesthesia Transfer of Care Note  Patient: [name]  Procedure(s) Performed: IR WITH ANESTHESIA  Patient Location: PACU  Anesthesia Type:General  Level of Consciousness: awake, alert , and oriented  Airway & Oxygen Therapy: Patient Spontanous Breathing and Patient connected to nasal cannula oxygen  Post-op Assessment: Report given to RN and Post -op Vital signs reviewed and stable, mild movement on right side, vigorous movement on left.  Post vital signs: Reviewed and stable  Last Vitals:  Vitals Value Taken Time  BP    Temp    Pulse 87 09/13/22 2301  Resp 16 09/13/22 2301  SpO2 97 % 09/13/22 2301  Vitals shown include unvalidated device data.  Last Pain:  Vitals:   09/13/22 1844  TempSrc: Temporal         Complications: No notable events documented.
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
## 2022-09-13 NOTE — Procedures (Signed)
INR.  S/P Left common , carotid arteriogram.  Right radial approach.  Findings.  Occluded dominant inferior division of the left middle several artery in the proximal M2 segment.  Status post complete revascularization of the occluded Lt MCA inferior division  with 1 pass with an 054 Free climb aspiration catheter achieving a TICI 3 revascularization. Post CT of the brain no evidence of intracranial hemorrhage.  Unable to reach the occluded  left MCA via the right femoral approach due to difficult anatomy of the left, carotid artery at its origin.  8 French Angio-Seal closure device deployed for  hemostasis at  the right groin puncture site. Distal pulses bilaterally dopplerable unchanged from prior to procedure.  Patient extubated.  Upon  recovery following simple commands.  Pupils 2 mm bilaterally equal and sluggishly reactive.  No facial asymmetry. Tongue midline.  Patient wiggling his right foot toes.  Not moving his right upper extremity.  Moves his left arm and leg freely.  S.Tanvi Gatling MD

## 2022-09-13 NOTE — Anesthesia Procedure Notes (Signed)
Arterial Line Insertion Start/End5/21/2024 8:00 PM Performed by: Garfield Cornea, CRNA, CRNA  Patient location: OOR procedure area. Preanesthetic checklist: patient identified, IV checked and monitors and equipment checked Emergency situation Patient sedated Left, radial was placed Catheter size: 20 G Maximum sterile barriers used   Attempts: 1 Procedure performed without using ultrasound guided technique. Following insertion, dressing applied and Biopatch. Post procedure assessment: normal  Patient tolerated the procedure well with no immediate complications.

## 2022-09-13 NOTE — Anesthesia Procedure Notes (Signed)
Procedure Name: Intubation Date/Time: 09/13/2022 7:59 PM  Performed by: Edmonia Caprio, CRNAPre-anesthesia Checklist: Patient identified, Emergency Drugs available, Suction available, Patient being monitored and Timeout performed Patient Re-evaluated:Patient Re-evaluated prior to induction Oxygen Delivery Method: Circle system utilized Preoxygenation: Pre-oxygenation with 100% oxygen Induction Type: IV induction, Rapid sequence and Cricoid Pressure applied Laryngoscope Size: Miller and 2 Grade View: Grade I Tube type: Oral Tube size: 7.5 mm Number of attempts: 1 Airway Equipment and Method: Stylet Placement Confirmation: ETT inserted through vocal cords under direct vision, positive ETCO2 and breath sounds checked- equal and bilateral Secured at: 23 cm Tube secured with: Tape Dental Injury: Teeth and Oropharynx as per pre-operative assessment

## 2022-09-13 NOTE — ED Provider Notes (Signed)
[place] EMERGENCY DEPARTMENT AT [facility] Provider Note   CSN: [id number] Arrival date & time: 09/13/22  1840  An emergency department physician performed an initial assessment on this suspected stroke patient at 1845.  History Chief Complaint  Patient presents with   Code Stroke    HPI [name] is a 82 y.o. male presenting for chief complaint of altered mental status.  Allegedly patient was able to go to doctors appointment this morning per EMS and made it home safely.   Unfortunately no collateral for family and patient is unable to provide any further history of present illness or ROS.  Patient's recorded medical, surgical, social, medication list and allergies were reviewed in the Snapshot window as part of the initial history.   Review of Systems   Review of Systems  Unable to perform ROS: Mental status change    Physical Exam Updated Vital Signs BP (!) 189/97   Pulse 77   Temp (!) 97.3 F (36.3 C) (Temporal)   Resp (!) 21   Wt 83.6 kg   SpO2 100%   BMI 25.00 kg/m  Physical Exam Physical Exam  Neurologic: GCS 12, motor sensory exam per neuro Head: Pupils are 3mm, equally round and reactive to light, patient has no obvious facial trauma, no hemotympanum  Neck: patient has no midline neck tenderness, no obvious injuries.  Thorax: Patient has stable clavicles, stable thorax with bilateral chest rise and breath sounds heard.  No penetrating thoracic injury.  CV/Pulm: RRR, no audible murmer/rubs/gallops, CTAB  Abdomen: Patient has no abdominal distention, no penetrating abdominal injury.  Back: Patient has no midline spinal tenderness in the thoracic and lumbar spine, patient has no paraspinal tenderness bilaterally.  Pelvis: Patient has a stable pelvis to compression with palpable femoral pulses.  Extremities:Patient's upper extremities with no obvious injury or abnormality, radial pulses present. Patient's lower extremities with no obvious injury  or abnormality, tibial pulses present.    ED Course/ Medical Decision Making/ A&P    Procedures .Critical Care  Performed by: [name], MD Authorized by: [name], MD   Critical care provider statement:    Critical care time (minutes):  30   Critical care was necessary to treat or prevent imminent or life-threatening deterioration of the following conditions:  CNS failure or compromise   Critical care was time spent personally by me on the following activities:  Development of treatment plan with patient or surrogate, discussions with consultants, evaluation of patient's response to treatment, examination of patient, ordering and review of laboratory studies, ordering and review of radiographic studies, ordering and performing treatments and interventions, pulse oximetry, re-evaluation of patient's condition and review of old charts   Care discussed with: admitting provider      Medications Ordered in ED Medications   stroke: early stages of recovery book (has no administration in time range)  0.9 %  sodium chloride infusion (has no administration in time range)  acetaminophen (TYLENOL) tablet 650 mg (has no administration in time range)    Or  acetaminophen (TYLENOL) 160 MG/5ML solution 650 mg (has no administration in time range)    Or  acetaminophen (TYLENOL) suppository 650 mg (has no administration in time range)  senna-docusate (Senokot-S) tablet 1 tablet (has no administration in time range)  iohexol (OMNIPAQUE) 350 MG/ML injection 75 mL (75 mLs Intravenous Contrast Given 09/13/22 1906)  iohexol (OMNIPAQUE) 350 MG/ML injection 100 mL (100 mLs Intravenous Contrast Given 09/13/22 1925)   Medical Decision Making:  CHAU SABER is a 82 y.o. male  who presented to the ED today with AMS/Right sided weakness.  Due to these symptoms, nursing activated a CODE STROKE per hospital protocol.   On my initial exam, the pt was in no acute distress, glucose was WNL and  deficits are  persistent.   Reviewed and confirmed nursing documentation for past medical history, family history, social history.   Initial Assessment and Plan:   Patient immediately evaluated jointly by neurology and emergency department providers.   This is most consistent with an acute life/limb threatening illness complicated by underlying chronic conditions.  Patient's HPI and PE findings are most consistent with completed CVA. Proceeded with [facility] and CTA per institutional policy which revealed L2 obstruction. ED/neurology evaluation is most consistent with LVO. Given the nature/timing of the symptoms, <48 hours, patient is a candidate for thrombectomy. Patient stabilized in ED while being arranged for IR/NS intervention. Patient admitted to the neuro ICU with no further acute events.     Clinical Impression:  1. Cerebrovascular accident (CVA), unspecified mechanism (HCC)      Admit   Final Clinical Impression(s) / ED Diagnoses Final diagnoses:  Cerebrovascular accident (CVA), unspecified mechanism (HCC)    Rx / DC Orders ED Discharge Orders     None         [name], MD 09/13/22 2013

## 2022-09-13 NOTE — H&P (Addendum)
NEUROLOGY H&P   Date of service: Sep 13, 2022 Patient Name: [name] MRN:  [id number] DOB:  03-Nov-1940 Reason for consult: stroke code Requesting physician: Dr. [name] _ _ _   _ __   _ __ _ _  __ __   _ __   __ _  History of Present Illness   This is an 82 yo man with hx HTN, ascending aortic aneurysm, EtOH abuse per chart review who is BIB EMS with R hemiplegia and aphasia. LKW 1330 when he left the house to drive himself to a doctor's appt. I was unable to reach wife or daughter by phone but per EMS daughter said she noticed these symptoms when he returned home at 1530 and they continued to get worse. NIHSS on arrival 23 with R hemiplegia and global aphasia. Head CT noncon personal review showed no acute process, ASPECTS 10. Patient was not a TNK candidate 2/2 presentation outside the window. CTA H&N was performed which showed a thrombus causing occlusion / near-occlusion of the proximal L M2. Case was discussed with Dr. Corliss Skains of neuro IR and we agreed to call code IR for mechnical thrombectomy. This will be done under 2 physician emergency consent 2/2 patient's inability to consent for himself and inability to get in touch with any family by phone.   After scan was completed patient's R sided weakness and aphasia acutely improved signifcantly, NIHSS 23 --> 4. Repeat CTA showed unchanged L proximal M2 near-occlusion. CTP showed 47 ml penumbra and 12 ml core in L MCA inferior division territory.   Given patient's improvement in exam he was now able to consent himself. Dr. Corliss Skains and I recommended proceeding with thrombectomy bc his exam improvement was felt to be 2/2 collaterals opening up but occlusion was unchanged and he had a significant associated perfusion deficit. I explained the risks, benefits, and alternatives to thrombectomy to the patient (risks incl 10% risk of intracranial hemorrhage) and he gave informed consent that he wished to proceed. Patient was then taken  for thrombectomy as a code IR.  Patient was in a fib on arrival to ED, this is a new dx. He is not on [facility].   ROS   Initially UTA 2/2 aphasia, subsequently ROS reviewed and was neg except per HPI  Past History   I have reviewed the following:  Past Medical History:  Diagnosis Date   Abnormal EKG    Alcoholism (HCC)    Ascending aortic aneurysm (HCC)    Edema of both legs    Gout    History of palpitations    Hypertension    Personal history of gout    Past Surgical History:  Procedure Laterality Date   incision of left neck     Family History  Problem Relation Age of Onset   Lung cancer Father    Social History   Socioeconomic History   Marital status: Married    Spouse name: Not on file   Number of children: Not on file   Years of education: Not on file   Highest education level: Not on file  Occupational History   Not on file  Tobacco Use   Smoking status: Never   Smokeless tobacco: Never  Substance and Sexual Activity   Alcohol use: Yes    Alcohol/week: 21.0 standard drinks of alcohol    Types: 21 Cans of beer per week   Drug use: No   Sexual activity: Not on file  Other Topics Concern  Not on file  Social History Narrative   Not on file   Social Determinants of Health   Financial Resource Strain: Not on file  Food Insecurity: Not on file  Transportation Needs: Not on file  Physical Activity: Not on file  Stress: Not on file  Social Connections: Not on file   No Known Allergies   Medications   (Not in a hospital admission)     Current Facility-Administered Medications:    [START ON 09/14/2022]  stroke: early stages of recovery book, , Does not apply, Once, Jefferson Fuel, MD   0.9 %  sodium chloride infusion, , Intravenous, Continuous, Jefferson Fuel, MD   acetaminophen (TYLENOL) tablet 650 mg, 650 mg, Oral, Q4H PRN **OR** acetaminophen (TYLENOL) 160 MG/5ML solution 650 mg, 650 mg, Per Tube, Q4H PRN **OR** acetaminophen (TYLENOL)  suppository 650 mg, 650 mg, Rectal, Q4H PRN, Jefferson Fuel, MD   senna-docusate (Senokot-S) tablet 1 tablet, 1 tablet, Oral, QHS PRN, Jefferson Fuel, MD  Current Outpatient Medications:    allopurinol (ZYLOPRIM) 300 MG tablet, Take 1 tablet (300 mg total) by mouth daily., Disp: 90 tablet, Rfl: 3   aspirin EC 81 MG tablet, Take 81 mg by mouth daily., Disp: , Rfl:    chlorthalidone (HYGROTON) 25 MG tablet, Take 25 mg by mouth daily. , Disp: , Rfl:    colchicine 0.6 MG tablet, Take 0.6 mg by mouth 2 (two) times daily as needed (gout flares). , Disp: , Rfl:    cyanocobalamin 500 MCG tablet, Take 500 mcg by mouth daily. Vitamin B12, Disp: , Rfl:    diphenhydrAMINE (BENADRYL) 25 MG tablet, Take 25 mg by mouth at bedtime as needed for sleep., Disp: , Rfl:    folic acid (FOLVITE) 1 MG tablet, Take 1 tablet (1 mg total) by mouth daily., Disp: 30 tablet, Rfl: 0   furosemide (LASIX) 20 MG tablet, Take 1 tablet (20 mg total) by mouth daily., Disp: 90 tablet, Rfl: 4   Ginseng 100 MG CAPS, Take 100 mg by mouth daily., Disp: , Rfl:    ketoconazole (NIZORAL) 2 % cream, Apply 1 application topically 2 (two) times daily as needed (rash). , Disp: , Rfl:    lisinopril (PRINIVIL,ZESTRIL) 30 MG tablet, Take 1 tablet (30 mg total) by mouth daily., Disp: 90 tablet, Rfl: 4   metoprolol tartrate (LOPRESSOR) 50 MG tablet, Take 25 mg by mouth daily. , Disp: , Rfl:    Multiple Vitamin (MULTIVITAMIN WITH MINERALS) TABS tablet, Take 1 tablet by mouth daily., Disp: 30 tablet, Rfl: 0   naproxen (NAPROSYN) 500 MG tablet, Take 500 mg by mouth 2 (two) times daily as needed (pain). , Disp: , Rfl:    OVER THE COUNTER MEDICATION, Take 1 tablet by mouth daily. Super Glucosamine 2000 plus - glucosamine, MSM, Boron, Silica and Hyaluronic Acid, Vitamin C, Manganese, Sodium, Disp: , Rfl:    sildenafil (VIAGRA) 100 MG tablet, Take 0.5-1 tablets (50-100 mg total) by mouth daily as needed for erectile dysfunction., Disp: 15 tablet, Rfl:  6   thiamine 100 MG tablet, Take 1 tablet (100 mg total) by mouth daily., Disp: 30 tablet, Rfl: 0   tiZANidine (ZANAFLEX) 4 MG tablet, Take 1 tablet (4 mg total) by mouth daily as needed for muscle spasms., Disp: 90 tablet, Rfl: 3  Facility-Administered Medications Ordered in Other Encounters:    0.9 %  sodium chloride infusion, , Intravenous, Continuous PRN, Edmonia Caprio, CRNA, New Bag at 09/13/22 1947   ceFAZolin (  ANCEF) IVPB 2 g/50 mL premix, , Intravenous, Anesthesia Intra-op, Edmonia Caprio, CRNA, 2 g at 09/13/22 2005   clevidipine (CLEVIPREX) infusion 0.5 mg/mL, , Intravenous, Continuous PRN, Edmonia Caprio, CRNA, Last Rate: 12 mL/hr at 09/13/22 2015, 6 mg/hr at 09/13/22 2015   fentaNYL citrate (PF) (SUBLIMAZE) injection, , Intravenous, Anesthesia Intra-op, Edmonia Caprio, CRNA, 25 mcg at 09/13/22 2009   propofol (DIPRIVAN) 10 mg/mL bolus/IV push, , Intravenous, Anesthesia Intra-op, Edmonia Caprio, CRNA, 100 mg at 09/13/22 1958   rocuronium bromide 10 mg/mL (PF) syringe, , Intravenous, Anesthesia Intra-op, Edmonia Caprio, CRNA, 50 mg at 09/13/22 2006   succinylcholine (ANECTINE) syringe, , Intravenous, Anesthesia Intra-op, Edmonia Caprio, CRNA, 120 mg at 09/13/22 1958  Vitals   Vitals:   09/13/22 1930 09/13/22 1935 09/13/22 1940 09/13/22 2016  BP: (!) 205/94 (!) 203/95 (!) 189/97   Pulse: 90 75 77   Resp: 17 19 (!) 21   Temp:      TempSrc:      SpO2: 98% 99% 100%   Weight:      Height:    6' (1.829 m)     Body mass index is 25 kg/m.  Physical Exam   Exam on arrival to ED:  Physical: Gen: alert, able to answer first name only but no other orientation questions, not following most simple commands CV: irregularly irregular Resp: CTAB  Neuro: *MS: alert, able to answer first name only but no other orientation questions, not following most simple commands *Speech: severe global aphasia, moderate dysarthria *CN:    I: Deferred   II,III: PERRLA, blinks to  threat on L but not R, optic discs unable to be visualized 2/2 pupillary constriction   III,IV,VI: EOMI w/o nystagmus, no ptosis   V: Sensation intact from V1 to V3 to LT   VII: Eyelid closure was full.  R UMN facial droop   VIII: Hearing intact to voice   IX,X: Voice normal, palate elevates symmetrically    XI: SCM/trap 5/5 bilat   XII: Tongue protrudes midline, no atrophy or fasciculations  *Motor:   Normal bulk.  No tremor, rigidity or bradykinesia. No movement RUE. No movement against gravity RLE. LUE and LLE full strength. *Sensory: severe impairment R side (insensate) *Coordination:  UTA 2/2 difficulty following commands *Reflexes:  2+ and symmetric throughout without clonus; toes down-going bilat *Gait: deferred  NIHSS  1a Level of Conscious.: 0 1b LOC Questions: 2 1c LOC Commands: 2 2 Best Gaze: 2 3 Visual: 2 4 Facial Palsy: 1 5a Motor Arm - left: 0 5b Motor Arm - Right: 4 6a Motor Leg - Left: 2 6b Motor Leg - Right: 3 7 Limb Ataxia: 0 8 Sensory: 1 9 Best Language: 2 10 Dysarthria: 2 11 Extinct. and Inatten.: 0  TOTAL: 23  After first CTA completed patient improved significantly to NIHSS of 4 (1 RUE drift, 2 sensory, 1 dysarthria)   Premorbid mRS = 1   Labs   CBC:  Recent Labs  Lab 09/13/22 1842 09/13/22 1849  WBC 5.3  --   NEUTROABS 3.9  --   HGB 12.1* 12.6*  HCT 38.5* 37.0*  MCV 101.3*  --   PLT 145*  --      Basic Metabolic Panel:  Lab Results  Component Value Date   NA 132 (L) 09/13/2022   K 5.2 (H) 09/13/2022   CO2 18 (L) 09/13/2022   GLUCOSE 98 09/13/2022   BUN 37 (H) 09/13/2022   CREATININE 1.40 (  H) 09/13/2022   CALCIUM 8.9 09/13/2022   GFRNONAA 47 (L) 09/13/2022   GFRAA >60 01/29/2017   Lipid Panel: No results found for: "LDLCALC" HgbA1c: No results found for: "HGBA1C" Urine Drug Screen:     Component Value Date/Time   LABOPIA NONE DETECTED 01/27/2017 1722   COCAINSCRNUR NONE DETECTED 01/27/2017 1722   LABBENZ NONE DETECTED  01/27/2017 1722   AMPHETMU NONE DETECTED 01/27/2017 1722   THCU NONE DETECTED 01/27/2017 1722   LABBARB NONE DETECTED 01/27/2017 1722    Alcohol Level     Component Value Date/Time   ETH <10 09/13/2022 1842    I personally reviewed the following imaging and discussed results by phone with neuroradiology  Head CT wo - no acute process, ASPECTS 10 CTA H&N - thrombus causing occlusion / near-occlusion of the proximal L M2 Repeat CTA H&N after improvement in exam - unchanged L proximal M2 near-occlusion CTP - 47 ml penumbra and 12 ml core in L MCA inferior division territory   Impression   This is an 82 yo man with hx HTN, ascending aortic aneurysm, EtOH abuse per chart review who was BIB EMS with R hemiplegia and aphasia (NIHSS 23) and was found to be having a 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 Etiology is embolic in setting of undiagnosed a fib.  After scan was completed patient's R sided weakness and aphasia acutely improved signifcantly, NIHSS 23 --> 4. Repeat CTA showed unchanged L proximal M2 near-occlusion. CTP showed 47 ml penumbra and 12 ml core in L MCA inferior division territory.   Given patient's improvement in exam he was now able to consent himself. Dr. Corliss Skains and I recommended proceeding with thrombectomy bc his exam improvement was felt to be 2/2 collaterals opening up but occlusion was unchanged and he had a significant associated perfusion deficit. I explained the risks, benefits, and alternatives to thrombectomy to the patient (risks incl 10% risk of intracranial hemorrhage) and he gave informed consent that he wished to proceed. Patient was then taken for thrombectomy as a code IR.  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 Selina Cooley - BP parameters per IR, clevidipine gtt prn - MRI brain wo contrast - TTE - Check A1c and LDL + add statin per guidelines - Start antiplatelet  tomorrow if no hemorrhage on post-procedure CT - Start pharmacologic DVT prophylaxis post-procedure if no hemorrhage on post-procedure CT - q4 hr neuro checks - STAT head CT for any change in neuro exam - Tele - PT/OT/SLP when able to participate - Stroke education   # A fib on arrival to ED, not previously diagnosed - Tele - Cardiology consult tomorrow - Hold anticoagulation in the setting of acute ischemic stroke (but [facility] will be indicated after the acute post stroke period)  # HTN - Hold home antihypertensives given need for strict BP control in setting of acute stroke (see above)  This patient is critically ill and at significant risk of neurological worsening, death and care requires constant monitoring of vital signs, hemodynamics,respiratory and cardiac monitoring, neurological assessment, discussion with family, other specialists and medical decision making of high complexity. I spent 100 minutes of neurocritical care time  in the care of  this patient. This was time spent independent of any time provided by nurse practitioner or PA.  Bing Neighbors, MD [facility] [phone]  If 7pm- 7am, please page neurology on call as listed  in AMION.

## 2022-09-13 NOTE — ED Triage Notes (Signed)
Pt arrived to ED via [facility] as CODE STROKE LKW 1330. S/S: R arm flaccid, R facial droop, expressive aphasia, difficulty following commands. New onset Afib noted by EMS. EMS IV 18 R hand, VS: HR 60, RR 18, BP 160/90, 94% RA, CBG 107

## 2022-09-14 ENCOUNTER — Other Ambulatory Visit (HOSPITAL_COMMUNITY): Payer: Self-pay

## 2022-09-14 ENCOUNTER — Encounter (HOSPITAL_COMMUNITY): Payer: Self-pay | Admitting: Interventional Radiology

## 2022-09-14 ENCOUNTER — Telehealth (HOSPITAL_COMMUNITY): Payer: Self-pay | Admitting: Pharmacy Technician

## 2022-09-14 ENCOUNTER — Inpatient Hospital Stay (HOSPITAL_COMMUNITY): Payer: 59

## 2022-09-14 DIAGNOSIS — I724 Aneurysm of artery of lower extremity: Secondary | ICD-10-CM | POA: Diagnosis not present

## 2022-09-14 DIAGNOSIS — I63512 Cerebral infarction due to unspecified occlusion or stenosis of left middle cerebral artery: Secondary | ICD-10-CM | POA: Diagnosis not present

## 2022-09-14 LAB — BASIC METABOLIC PANEL
Anion gap: 8 (ref 5–15)
BUN: 22 mg/dL (ref 8–23)
CO2: 16 mmol/L — ABNORMAL LOW (ref 22–32)
Calcium: 8.2 mg/dL — ABNORMAL LOW (ref 8.9–10.3)
Chloride: 104 mmol/L (ref 98–111)
Creatinine, Ser: 1.35 mg/dL — ABNORMAL HIGH (ref 0.61–1.24)
GFR, Estimated: 53 mL/min — ABNORMAL LOW (ref >60)
Glucose, Bld: 117 mg/dL — ABNORMAL HIGH (ref 70–99)
Potassium: 3.5 mmol/L (ref 3.5–5.1)
Sodium: 128 mmol/L — ABNORMAL LOW (ref 135–145)

## 2022-09-14 LAB — RAPID URINE DRUG SCREEN, HOSP PERFORMED
Amphetamines: NOT DETECTED
Barbiturates: NOT DETECTED
Benzodiazepines: NOT DETECTED
Cocaine: NOT DETECTED
Opiates: NOT DETECTED
Tetrahydrocannabinol: NOT DETECTED

## 2022-09-14 LAB — CBC WITH DIFFERENTIAL/PLATELET
Abs Immature Granulocytes: 0.04 10*3/uL (ref 0.00–0.07)
Basophils Absolute: 0 10*3/uL (ref 0.0–0.1)
Basophils Relative: 0 %
Eosinophils Absolute: 0.9 10*3/uL — ABNORMAL HIGH (ref 0.0–0.5)
Eosinophils Relative: 12 %
HCT: 32.2 % — ABNORMAL LOW (ref 39.0–52.0)
Hemoglobin: 10.4 g/dL — ABNORMAL LOW (ref 13.0–17.0)
Immature Granulocytes: 1 %
Lymphocytes Relative: 6 %
Lymphs Abs: 0.4 10*3/uL — ABNORMAL LOW (ref 0.7–4.0)
MCH: 31.6 pg (ref 26.0–34.0)
MCHC: 32.3 g/dL (ref 30.0–36.0)
MCV: 97.9 fL (ref 80.0–100.0)
Monocytes Absolute: 0.6 10*3/uL (ref 0.1–1.0)
Monocytes Relative: 9 %
Neutro Abs: 5.2 10*3/uL (ref 1.7–7.7)
Neutrophils Relative %: 72 %
Platelets: 152 10*3/uL (ref 150–400)
RBC: 3.29 MIL/uL — ABNORMAL LOW (ref 4.22–5.81)
RDW: 16.3 % — ABNORMAL HIGH (ref 11.5–15.5)
WBC: 7.1 10*3/uL (ref 4.0–10.5)
nRBC: 0 % (ref 0.0–0.2)

## 2022-09-14 LAB — URINALYSIS, ROUTINE W REFLEX MICROSCOPIC
Bacteria, UA: NONE SEEN
Bilirubin Urine: NEGATIVE
Glucose, UA: NEGATIVE mg/dL
Ketones, ur: 5 mg/dL — AB
Leukocytes,Ua: NEGATIVE
Nitrite: NEGATIVE
Protein, ur: NEGATIVE mg/dL
Specific Gravity, Urine: 1.039 — ABNORMAL HIGH (ref 1.005–1.030)
pH: 5 (ref 5.0–8.0)

## 2022-09-14 LAB — HEMOGLOBIN A1C
Hgb A1c MFr Bld: 4.8 % (ref 4.8–5.6)
Mean Plasma Glucose: 91.06 mg/dL

## 2022-09-14 LAB — ECHOCARDIOGRAM COMPLETE
AR max vel: 4.47 cm2
AV Area VTI: 5.02 cm2
AV Area mean vel: 4.54 cm2
AV Mean grad: 3.3 mmHg
AV Peak grad: 6 mmHg
Ao pk vel: 1.22 m/s
Area-P 1/2: 3.82 cm2
Calc EF: 68.4 %
Est EF: >75
Height: 72 in
MV VTI: 2.89 cm2
S' Lateral: 3.1 cm
Single Plane A2C EF: 73 %
Single Plane A4C EF: 68 %
Weight: 2948.87 oz

## 2022-09-14 LAB — MRSA NEXT GEN BY PCR, NASAL: MRSA by PCR Next Gen: NOT DETECTED

## 2022-09-14 LAB — LIPID PANEL
Cholesterol: 119 mg/dL (ref 0–200)
HDL: 70 mg/dL (ref >40)
LDL Cholesterol: 32 mg/dL (ref 0–99)
Total CHOL/HDL Ratio: 1.7 RATIO
Triglycerides: 86 mg/dL (ref <150)
VLDL: 17 mg/dL (ref 0–40)

## 2022-09-14 MED ORDER — LABETALOL HCL 5 MG/ML IV SOLN
10.0000 mg | INTRAVENOUS | Status: DC | PRN
  Administered 2022-09-14 – 2022-09-15 (×4): 10 mg via INTRAVENOUS
  Filled 2022-09-14 (×3): qty 4

## 2022-09-14 MED ORDER — LISINOPRIL 20 MG PO TABS
20.0000 mg | ORAL_TABLET | Freq: Every day | ORAL | Status: DC
  Administered 2022-09-15: 20 mg via ORAL
  Filled 2022-09-14 (×2): qty 1

## 2022-09-14 MED ORDER — HYDRALAZINE HCL 20 MG/ML IJ SOLN
10.0000 mg | Freq: Four times a day (QID) | INTRAMUSCULAR | Status: DC | PRN
  Administered 2022-09-14: 10 mg via INTRAVENOUS
  Filled 2022-09-14: qty 1

## 2022-09-14 MED ORDER — APIXABAN 5 MG PO TABS: 5.0000 mg | ORAL_TABLET | Freq: Two times a day (BID) | ORAL | Status: DC

## 2022-09-14 NOTE — Progress Notes (Signed)
Right pseudoaneurysm study has been completed.  Preliminary results given to Rosey Bath, [place].    Results can be found under chart review under CV PROC. 09/14/2022 12:15 PM Mally Gavina RVT, RDMS

## 2022-09-14 NOTE — Progress Notes (Signed)
Orthopedic Tech Progress Note Patient Details:  [name] [birth year] [id number]  Ortho Devices Type of Ortho Device: Knee Immobilizer Ortho Device/Splint Location: RLE Ortho Device/Splint Interventions: Ordered, Application, Adjustment   Post Interventions Patient Tolerated: Well Instructions Provided: Care of device  [name] 09/14/2022, 11:34 AM

## 2022-09-14 NOTE — Progress Notes (Addendum)
Belongings at bedside: shirt, pants, shoes, gold chain neclace, gold ring, top dentures, eye glasses, wallet with $30 cash, and bag of home meds.  Home meds sent to pharmacy, pt informed.   Note also in belongings that had "Selena Batten (daughter) [phone]" written on it.  Pt reported his wife has passed and his daughter is in [place].  Harrington Challenger is his step- daughter.  This RN asked if he wanted me to contact her and he said yes.  Kim called and updated by this RN.  Patient contacts updated in chart.

## 2022-09-14 NOTE — Evaluation (Signed)
Occupational Therapy Evaluation Patient Details Name: [name] MRN: [id number] DOB: 03-01-1941 Today's Date: 09/14/2022   History of Present Illness Patient is a 82 yo male presenting to the ED with R arm flaccid, R sided facial droop and expressive aphasia on 09/13/22. 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 PMH includes: HTN, ascending aortic aneurysm, EtOH abuse   Clinical Impression   Prior to this admission, patient independent, driving, occasionally walking with a cane, but able to manage his ADLs and IADLs without issue. Currently, patient presenting with potential upper level cognitive deficits, and need for increased assist to complete ADLs. Evaluation limited in its scope as patient began to bleed from his groin site IR access point. OT holding pressure at groin site, with RN immediately assisting and resuming pressure. OT handing off to nursing at end of session. OT will continue to assess patient and patient's need for follow up therapies when medically appropriate.      Recommendations for follow up therapy are one component of a multi-disciplinary discharge planning process, led by the attending physician.  Recommendations may be updated based on patient status, additional functional criteria and insurance authorization.   Assistance Recommended at Discharge Frequent or constant Supervision/Assistance (initially)  Patient can return home with the following Other (comment) (will continue to assess)    Functional Status Assessment  Patient has had a recent decline in their functional status and demonstrates the ability to make significant improvements in function in a reasonable and predictable amount of time.  Equipment Recommendations  Other (comment) (will continue to assess)    Recommendations for Other Services       Precautions / Restrictions Precautions Precautions: Fall Restrictions Weight Bearing Restrictions: No      Mobility  Bed Mobility Overal bed mobility: Needs Assistance Bed Mobility: Supine to Sit, Sit to Supine     Supine to sit: Min guard Sit to supine: Total assist   General bed mobility comments: min gaurd to sit EOB, total A to return to EOB due to bleeding at groin site    Transfers                   General transfer comment: did not assess      Balance Overall balance assessment: Mild deficits observed, not formally tested                                         ADL either performed or assessed with clinical judgement   ADL Overall ADL's : Needs assistance/impaired Eating/Feeding: Set up;Sitting   Grooming: Set up;Sitting   Upper Body Bathing: Min guard;Sitting   Lower Body Bathing: Moderate assistance;Sitting/lateral leans;Sit to/from stand   Upper Body Dressing : Min guard;Sitting   Lower Body Dressing: Moderate assistance;Sitting/lateral leans;Sit to/from stand Lower Body Dressing Details (indicate cue type and reason): unable to don socks, states he usually can put on underwear from low bench   Toilet Transfer Details (indicate cue type and reason): did not assess         Functional mobility during ADLs: Minimal assistance General ADL Comments: Patient presenting with potential upper level cognitive deficits, patient's evaluation shortened due to bleeding from femoral IR site and requiring pressure to be held.     Vision Baseline Vision/History: 1 Wears glasses (Readers) Ability to See in Adequate Light: 0 Adequate Patient Visual Report: No change from baseline Vision Assessment?:  Yes Eye Alignment: Within Functional Limits Alignment/Gaze Preference: Within Defined Limits Tracking/Visual Pursuits: Able to track stimulus in all quads without difficulty Saccades: Within functional limits Convergence: Within functional limits Visual Fields: No apparent deficits     Perception     Praxis      Pertinent Vitals/Pain Pain Assessment Pain  Assessment: No/denies pain     Hand Dominance Right   Extremity/Trunk Assessment Upper Extremity Assessment Upper Extremity Assessment: Overall WFL for tasks assessed   Lower Extremity Assessment Lower Extremity Assessment: Defer to PT evaluation   Cervical / Trunk Assessment Cervical / Trunk Assessment: Normal   Communication Communication Communication: No difficulties   Cognition Arousal/Alertness: Awake/alert Behavior During Therapy: WFL for tasks assessed/performed Overall Cognitive Status: Difficult to assess                                 General Comments: Patient is alert and oriented, but appeared to have some moments of confabulation, did not get a chance to fully assess     General Comments       Exercises     Shoulder Instructions      Home Living Family/patient expects to be discharged to:: Private residence Living Arrangements: Alone   Type of Home: House Home Access: Stairs to enter Secretary/administrator of Steps: 3 Entrance Stairs-Rails: Left Home Layout: One level     Bathroom Shower/Tub: Producer, television/film/video: Standard     Home Equipment: Cane - single point;Shower seat;BSC/3in1          Prior Functioning/Environment Prior Level of Function : Independent/Modified Independent             Mobility Comments: walking with a cane occasionally ADLs Comments: independent, driving, does his own medications        OT Problem List: Decreased strength;Decreased activity tolerance;Decreased cognition;Decreased safety awareness      OT Treatment/Interventions: Self-care/ADL training;Therapeutic exercise;Neuromuscular education;Energy conservation;DME and/or AE instruction;Therapeutic activities;Cognitive remediation/compensation;Patient/family education;Balance training    OT Goals(Current goals can be found in the care plan section) Acute Rehab OT Goals Patient Stated Goal: to get better OT Goal Formulation:  With patient Time For Goal Achievement: 09/28/22 Potential to Achieve Goals: Good  OT Frequency: Min 2X/week    Co-evaluation PT/OT/SLP Co-Evaluation/Treatment: Yes Reason for Co-Treatment: To address functional/ADL transfers          AM-PAC OT "6 Clicks" Daily Activity     Outcome Measure Help from another person eating meals?: None Help from another person taking care of personal grooming?: None Help from another person toileting, which includes using toliet, bedpan, or urinal?: A Little Help from another person bathing (including washing, rinsing, drying)?: A Lot Help from another person to put on and taking off regular upper body clothing?: A Little Help from another person to put on and taking off regular lower body clothing?: A Lot 6 Click Score: 18   End of Session Nurse Communication: Mobility status;Other (comment) (bleeding from groin site)  Activity Tolerance:   Patient left: in bed;with nursing/sitter in room  OT Visit Diagnosis: Other symptoms and signs involving cognitive function;Muscle weakness (generalized) (M62.81)                Time: 1000-1023 OT Time Calculation (min): 23 min Charges:  OT General Charges $OT Visit: 1 Visit OT Evaluation $OT Eval Moderate Complexity: 1 Mod  Pollyann Glen E. Avyn Aden, OTR/L Acute Rehabilitation Services [phone]   [facility]  Felipa Emory 09/14/2022, 2:55 PM

## 2022-09-14 NOTE — Evaluation (Signed)
Physical Therapy Evaluation Patient Details Name: [name] MRN: [id number] DOB: 01-30-1941 Today's Date: 09/14/2022  History of Present Illness  Patient is a 82 yo male presenting to the ED with R arm flaccid, R sided facial droop and expressive aphasia on 09/13/22. 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 PMH includes: HTN, ascending aortic aneurysm, EtOH abuse  Clinical Impression  Patient presents with decreased mobility due to generalized weakness.  Session/assessment limited due to  R groin IR access site bleeding pulsitile.  RN in to assist with holding pressure.  Noted initially moving all 4 extremities.  Patient previously living alone with large acreage with 2 gardeners and a Curator, though reports needs a housekeeper.  Patient was driving and taking care of his own IADL needs.  Feel he will benefit from skilled PT in the acute setting.  Follow up PT to be determined when able to see pt mobilize OOB.      Recommendations for follow up therapy are one component of a multi-disciplinary discharge planning process, led by the attending physician.  Recommendations may be updated based on patient status, additional functional criteria and insurance authorization.  Follow Up Recommendations       Assistance Recommended at Discharge    Patient can return home with the following  A little help with walking and/or transfers;A little help with bathing/dressing/bathroom    Equipment Recommendations Other (comment) (TBA)  Recommendations for Other Services       Functional Status Assessment Patient has had a recent decline in their functional status and demonstrates the ability to make significant improvements in function in a reasonable and predictable amount of time.     Precautions / Restrictions Precautions Precautions: Fall Restrictions Weight Bearing Restrictions: No      Mobility  Bed Mobility Overal bed mobility: Needs Assistance Bed  Mobility: Supine to Sit, Sit to Supine     Supine to sit: Min guard Sit to supine: Total assist   General bed mobility comments: min gaurd to sit EOB, total A to return to EOB due to bleeding at groin site    Transfers                        Ambulation/Gait                  Stairs            Wheelchair Mobility    Modified Rankin (Stroke Patients Only) Modified Rankin (Stroke Patients Only) Pre-Morbid Rankin Score: No symptoms Modified Rankin: Moderately severe disability     Balance Overall balance assessment: Needs assistance   Sitting balance-Leahy Scale: Fair Sitting balance - Comments: on EOB briefly till return to supine due to R groin bleeding                                     Pertinent Vitals/Pain Pain Assessment Pain Assessment: Faces Faces Pain Scale: Hurts even more Pain Location: R groin with pressure Pain Descriptors / Indicators: Sore Pain Intervention(s): Monitored during session    Home Living Family/patient expects to be discharged to:: Private residence Living Arrangements: Alone   Type of Home: House Home Access: Stairs to enter Entrance Stairs-Rails: Left Entrance Stairs-Number of Steps: 3   Home Layout: One level Home Equipment: Cane - single point;Shower seat;BSC/3in1      Prior Function Prior Level of Function : Independent/Modified Independent  Mobility Comments: walking with a cane occasionally ADLs Comments: independent, driving, does his own medications     Hand Dominance   Dominant Hand: Right    Extremity/Trunk Assessment   Upper Extremity Assessment Upper Extremity Assessment: Overall WFL for tasks assessed    Lower Extremity Assessment Lower Extremity Assessment:  (limited assessment as pt with bleeding from groin arterial site and had to return to supine)    Cervical / Trunk Assessment Cervical / Trunk Assessment: Normal  Communication   Communication:  No difficulties  Cognition Arousal/Alertness: Awake/alert Behavior During Therapy: WFL for tasks assessed/performed Overall Cognitive Status: Within Functional Limits for tasks assessed                                 General Comments: oriented x 4        General Comments General comments (skin integrity, edema, etc.): VSS initially    Exercises     Assessment/Plan    PT Assessment Patient needs continued PT services  PT Problem List Decreased strength;Decreased mobility;Decreased activity tolerance;Decreased knowledge of use of DME       PT Treatment Interventions DME instruction;Balance training;Gait training;Stair training;Functional mobility training;Patient/family education;Therapeutic activities;Therapeutic exercise    PT Goals (Current goals can be found in the Care Plan section)  Acute Rehab PT Goals Patient Stated Goal: not able to state PT Goal Formulation: Patient unable to participate in goal setting Time For Goal Achievement: 09/28/22 Potential to Achieve Goals: Good    Frequency Min 4X/week     Co-evaluation PT/OT/SLP Co-Evaluation/Treatment: Yes Reason for Co-Treatment: To address functional/ADL transfers PT goals addressed during session: Mobility/safety with mobility         AM-PAC PT "6 Clicks" Mobility  Outcome Measure Help needed turning from your back to your side while in a flat bed without using bedrails?: A Little Help needed moving from lying on your back to sitting on the side of a flat bed without using bedrails?: A Little Help needed moving to and from a bed to a chair (including a wheelchair)?: Total Help needed standing up from a chair using your arms (e.g., wheelchair or bedside chair)?: Total Help needed to walk in hospital room?: Total Help needed climbing 3-5 steps with a railing? : Total 6 Click Score: 10    End of Session   Activity Tolerance: Treatment limited secondary to medical complications  (Comment) Patient left: in bed Nurse Communication: Other (comment) (groin bleeding) PT Visit Diagnosis: Muscle weakness (generalized) (M62.81);Difficulty in walking, not elsewhere classified (R26.2)    Time: 1000-1023 PT Time Calculation (min) (ACUTE ONLY): 23 min   Charges:   PT Evaluation $PT Eval Moderate Complexity: 1 Mod          Cyndi Belen Zwahlen, PT Acute Rehabilitation Services Office:[phone] 09/14/2022   Elray Mcgregor 09/14/2022, 3:24 PM

## 2022-09-14 NOTE — Progress Notes (Signed)
ANTICOAGULATION CONSULT NOTE - Initial Consult  Pharmacy Consult for Eliquis  Indication: atrial fibrillation  No Known Allergies  Patient Measurements: Height: 6' (182.9 cm) Weight: 83.6 kg (184 lb 4.9 oz) IBW/kg (Calculated) : 77.6  Vital Signs: Temp: 98.1 F (36.7 C) (05/22 1200) Temp Source: Oral (05/22 1200) BP: 125/57 (05/22 1500) Pulse Rate: 84 (05/22 1500)  Labs: Recent Labs    09/13/22 1842 09/13/22 1849 09/14/22 0531  HGB 12.1* 12.6* 10.4*  HCT 38.5* 37.0* 32.2*  PLT 145*  --  152  APTT 33  --   --   LABPROT 16.1*  --   --   INR 1.3*  --   --   CREATININE 1.50* 1.40* 1.35*    Estimated Creatinine Clearance: 47.9 mL/min (A) (by C-G formula based on SCr of 1.35 mg/dL (H)).   Medical History: Past Medical History:  Diagnosis Date   Abnormal EKG    Alcoholism (HCC)    Ascending aortic aneurysm (HCC)    Edema of both legs    Gout    History of palpitations    Hypertension    Personal history of gout     Assessment: Patient admitted with stroke s/p MER. Likely due to Afib not on anticoagulation PTA. Pharmacy consulted to dose Eliquis.   Goal of Therapy:  Monitor platelets by anticoagulation protocol: Yes   Plan:  Eliquis 5mg  BID.  Monitor for s/sx of bleeding.   Estill Batten, PharmD, BCCCP  09/14/2022,3:25 PM

## 2022-09-14 NOTE — Anesthesia Postprocedure Evaluation (Signed)
Anesthesia Post Note  Patient: [name]  Procedure(s) Performed: IR WITH ANESTHESIA     Patient location during evaluation: PACU Anesthesia Type: General Level of consciousness: awake and alert Pain management: pain level controlled Vital Signs Assessment: post-procedure vital signs reviewed and stable Respiratory status: spontaneous breathing, nonlabored ventilation, respiratory function stable and patient connected to nasal cannula oxygen Cardiovascular status: blood pressure returned to baseline and stable Postop Assessment: no apparent nausea or vomiting Anesthetic complications: no  No notable events documented.  Last Vitals:  Vitals:   09/14/22 0200 09/14/22 0215  BP: (!) 123/98   Pulse: 88 85  Resp: 12 17  Temp:    SpO2: 98% 98%    Last Pain:  Vitals:   09/13/22 2300  TempSrc: Oral  PainSc: 0-No pain                 Shelton Silvas

## 2022-09-14 NOTE — Progress Notes (Signed)
  Spoke Dr. Derry Lory about holding pts Eliquis for the night d/t bleeding at groin site today as well as pt continuing to be on bedrest. Per Dr. Derry Lory hold dose. Pharmacy also involved.

## 2022-09-14 NOTE — TOC Benefit Eligibility Note (Signed)
Patient Advocate Encounter  Insurance verification completed.    The patient is currently admitted and upon discharge could be taking Eliquis 5 mg.  The current 30 day co-pay is $0.00.   The patient is insured through [employer] Medicare Part D   This test claim was processed through [facility]- copay amounts may vary at other pharmacies due to pharmacy/plan contracts, or as the patient moves through the different stages of their insurance plan.  Ingvald Theisen, CPHT Pharmacy Patient Advocate Specialist [facility] Patient Advocate Team Direct Number: [phone]  Fax: [phone]       

## 2022-09-14 NOTE — Consult Note (Addendum)
Cardiology Consultation   Patient ID: SHEDRIC IADAROLA MRN: [id number]; DOB: [birth year]  Admit date: 09/13/2022 Date of Consult: 09/14/2022  PCP:  Barbette Reichmann, MD   [facility] Providers Cardiologist:  None   {   Patient Profile:   [name] is a 82 y.o. male with a hx of hypertension, ascending aortic aneurysm, alcohol abuse, anemia, chronic hyponatremia, who is being seen 09/14/2022 for the evaluation of new onset atrial fibrillation at the request of Dr. Stroke.  History of Present Illness:   [name] does not have any significant cardiac history.  He was seen back in 2018 by [facility] for management of his hypertension and heart palpitations.  Per chart review, his Holter monitor was consistent with sinus bradycardia, PVCs, asymptomatic bradycardia, sinus tachycardia, sick sinus syndrome but no documented occurrences of atrial fibrillation.  He has had these palpitations for several years now and been on metoprolol without any significant impairments.  His last echocardiogram in November 2022 indicated normal LVEF of >55%.  Mild dilation of his aorta and LA.  Patient was initially admitted for altered mental status with right hemiplegia and aphasia.  Head CT did not show any acute process.  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  Neurology was consulted and patient underwent mechanical thrombectomy.  Postprocedure patient is doing well with no focal neurological deficits on exam.  However has had some arterial bleeding from right groin puncture. Stat ultrasound showed right pseudoaneurysm.  Cardiology has been consulted for new onset of atrial fibrillation that was reported to be found on arrival in the ED.   After discussion with the patient it appears that he has had long-term diagnosis of palpitations in which he is on metoprolol.  He states that he thinks he has been on Eliquis before denies any diagnosis of atrial fibrillation.   Per chart review it also appears that patient has some memory impairment issues.  Patient is generally asymptomatic from his A-fib.  Denies any history or current complaints of shortness of breath, heart palpitations, dizziness, chest pain, peripheral edema, decreased functional status, fatigue  Past Medical History:  Diagnosis Date   Abnormal EKG    Alcoholism (HCC)    Ascending aortic aneurysm (HCC)    Edema of both legs    Gout    History of palpitations    Hypertension    Personal history of gout     Past Surgical History:  Procedure Laterality Date   incision of left neck     RADIOLOGY WITH ANESTHESIA N/A 09/13/2022   Procedure: IR WITH ANESTHESIA;  Surgeon: Trevor Cotton, MD;  Location: [facility] OR;  Service: Radiology;  Laterality: N/A;     Inpatient Medications: Scheduled Meds:  Chlorhexidine Gluconate Cloth  6 each Topical Daily   Continuous Infusions:  sodium chloride     clevidipine 22 mg/hr (09/14/22 1414)   PRN Meds: acetaminophen **OR** acetaminophen (TYLENOL) oral liquid 160 mg/5 mL **OR** acetaminophen, eptifibatide, heparin sodium (porcine), hydrALAZINE, labetalol, nitroGLYCERIN 100 mcg in sodium chloride (PF) 0.9 % 59.72 mL (25 mcg/mL) syringe, mouth rinse, senna-docusate, verapamil  Allergies:   No Known Allergies  Social History:   Social History   Socioeconomic History   Marital status: Widowed    Spouse name: Not on file   Number of children: Not on file   Years of education: Not on file   Highest education level: Not on file  Occupational History   Not on file  Tobacco Use   Smoking status: Never   Smokeless tobacco: Never  Substance and Sexual Activity   Alcohol use: Yes    Alcohol/week: 21.0 standard drinks of alcohol    Types: 21 Cans of beer per week   Drug use: No   Sexual activity: Not on file  Other Topics Concern   Not on file  Social History Narrative   Not on file   Social Determinants of Health   Financial Resource Strain:  Not on file  Food Insecurity: Not on file  Transportation Needs: Not on file  Physical Activity: Not on file  Stress: Not on file  Social Connections: Not on file  Intimate Partner Violence: Not on file    Family History:   Family History  Problem Relation Age of Onset   Lung cancer Father      ROS:  Please see the history of present illness.  All other ROS reviewed and negative.     Physical Exam/Data:   Vitals:   09/14/22 1230 09/14/22 1245 09/14/22 1300 09/14/22 1315  BP: 134/78 131/65 133/61 (!) 128/59  Pulse: 77 73 78 80  Resp: (!) 25 13 15 15   Temp:      TempSrc:      SpO2: 95% 97% 98% 99%  Weight:      Height:        Intake/Output Summary (Last 24 hours) at 09/14/2022 1432 Last data filed at 09/14/2022 1300 Gross per 24 hour  Intake 2294.92 ml  Output 1075 ml  Net 1219.92 ml      09/13/2022    6:00 PM 02/22/2021    9:44 AM 01/27/2018   12:28 PM  Last 3 Weights  Weight (lbs) 184 lb 4.9 oz 240 lb 15.4 oz 241 lb  Weight (kg) 83.6 kg 109.3 kg 109.317 kg     Body mass index is 25 kg/m.  General:  Well nourished, well developed, in no acute distress HEENT: normal Neck: no JVD Vascular: No carotid bruits; Distal pulses 2+ bilaterally Cardiac: Irregularly irregular no murmur Lungs:  clear to auscultation bilaterally, no wheezing, rhonchi or rales  Abd: soft, nontender, no hepatomegaly  Ext: no edema Musculoskeletal:  No deformities, BUE and BLE strength normal and equal Skin: warm and dry  Neuro:  CNs 2-12 intact, no focal abnormalities noted Psych:  Normal affect   EKG:  The EKG was personally reviewed and demonstrates: Atrial fibrillation, heart rate 86.  Left axis deviation.  Nonspecific T wave changes diffusely. Telemetry:  Telemetry was personally reviewed and demonstrates: Atrial fibrillation with heart rates in the 60s to 80s  Relevant CV Studies:   Laboratory Data:  High Sensitivity Troponin:  No results for input(s): "TROPONINIHS" in the last  720 hours.   Chemistry Recent Labs  Lab 09/13/22 1842 09/13/22 1849 09/14/22 0531  NA 130* 132* 128*  K 4.6 5.2* 3.5  CL 102 103 104  CO2 18*  --  16*  GLUCOSE 105* 98 117*  BUN 28* 37* 22  CREATININE 1.50* 1.40* 1.35*  CALCIUM 8.9  --  8.2*  GFRNONAA 47*  --  53*  ANIONGAP 10  --  8    Recent Labs  Lab 09/13/22 1842  PROT 6.9  ALBUMIN 3.5  AST 19  ALT 10  ALKPHOS 82  BILITOT 0.7   Lipids  Recent Labs  Lab 09/14/22 0531  CHOL 119  TRIG 86  HDL 70  LDLCALC 32  CHOLHDL 1.7    Hematology Recent Labs  Lab 09/13/22 1842 09/13/22 1849 09/14/22 0531  WBC 5.3  --  7.1  RBC 3.80*  --  3.29*  HGB 12.1* 12.6* 10.4*  HCT 38.5* 37.0* 32.2*  MCV 101.3*  --  97.9  MCH 31.8  --  31.6  MCHC 31.4  --  32.3  RDW 16.3*  --  16.3*  PLT 145*  --  152   Thyroid No results for input(s): "TSH", "FREET4" in the last 168 hours.  BNPNo results for input(s): "BNP", "PROBNP" in the last 168 hours.  DDimer No results for input(s): "DDIMER" in the last 168 hours.   Radiology/Studies:  VAS [place] GROIN PSEUDOANEURYSM  Result Date: 09/14/2022  ARTERIAL PSEUDOANEURYSM  Patient Name:  TAEGEN MOLESKY  Date of Exam:   09/14/2022 Medical Rec #: [id number]         Accession #:    [id number] Date of Birth: [birth year]         Patient Gender: M Patient Age:   50 years Exam Location:  [facility] Procedure:      VAS [place] Bobetta Lime Referring Phys: [name] --------------------------------------------------------------------------------  Exam: Right groin Indications: Patient complains of bleeding post IR procedure. History: S/P Left MCA mechanical thrombectomy. Limitations: Post procedure pressure dressing Comparison Study: No previous exams Performing Technologist: Jody Hill RVT, RDMS  Examination Guidelines: A complete evaluation includes B-mode imaging, spectral Doppler, color Doppler, and power Doppler as needed of all accessible portions of each vessel. Bilateral testing is  considered an integral part of a complete examination. Limited examinations for reoccurring indications may be performed as noted. +------------+----------+---------+------+----------+ Right DuplexPSV (cm/s)Waveform PlaqueComment(s) +------------+----------+---------+------+----------+ CFA            143    biphasic                  +------------+----------+---------+------+----------+ PFA             66    biphasic                  +------------+----------+---------+------+----------+ Prox SFA        83    triphasic                 +------------+----------+---------+------+----------+ Right Vein comments:CFV patent with phasic flow.  Summary: No evidence of pseudoaneurysm, AVF or DVT    --------------------------------------------------------------------------------    Preliminary    MR BRAIN WO CONTRAST  Result Date: 09/14/2022 CLINICAL DATA:  Left MCA stroke.  Status post left M2 thrombectomy. EXAM: MRI HEAD WITHOUT CONTRAST TECHNIQUE: Multiplanar, multiecho pulse sequences of the brain and surrounding structures were obtained without intravenous contrast. COMPARISON:  CTA head neck 09/13/2022 FINDINGS: Brain: There are 2 small regions of abnormal diffusion restriction within the left anterior cerebral artery territory. No acute ischemia in the left MCA territory. Punctate focus of acute ischemia in the left cerebellum. No acute or chronic hemorrhage. Normal white matter signal, parenchymal volume and CSF spaces. The midline structures are normal. Vascular: Major flow voids are preserved. Skull and upper cervical spine: Normal calvarium and skull base. Visualized upper cervical spine and soft tissues are normal. Sinuses/Orbits:No paranasal sinus fluid levels or advanced mucosal thickening. No mastoid or middle ear effusion. Normal orbits. IMPRESSION: 1. Two small regions of acute ischemia within the left anterior cerebral artery territory. No hemorrhage or mass effect. 2. Punctate  focus of acute ischemia in the left cerebellum. 3. No left MCA territory acute ischemia. Electronically Signed   By: Chrisandra Netters.D.  On: 09/14/2022 01:57   CT ANGIO HEAD NECK W WO CM W PERF (CODE STROKE)  Result Date: 09/13/2022 CLINICAL DATA:  Right-sided weakness EXAM: CT ANGIOGRAPHY HEAD AND NECK CT PERFUSION BRAIN TECHNIQUE: Multidetector CT imaging of the head and neck was performed using the standard protocol during bolus administration of intravenous contrast. Multiplanar CT image reconstructions and MIPs were obtained to evaluate the vascular anatomy. Carotid stenosis measurements (when applicable) are obtained utilizing NASCET criteria, using the distal internal carotid diameter as the denominator. Multiphase CT imaging of the brain was performed following IV bolus contrast injection. Subsequent parametric perfusion maps were calculated using RAPID software. RADIATION DOSE REDUCTION: This exam was performed according to the departmental dose-optimization program which includes automated exposure control, adjustment of the mA and/or kV according to patient size and/or use of iterative reconstruction technique. CONTRAST:  OMNIPAQUE IOHEXOL 350 MG/ML SOLN COMPARISON:  09/13/2022 at 7:03 p.m. FINDINGS: CTA NECK FINDINGS SKELETON: There is no bony spinal canal stenosis. No lytic or blastic lesion. OTHER NECK: Normal pharynx, larynx and major salivary glands. No cervical lymphadenopathy. Unremarkable thyroid gland. UPPER CHEST: No pneumothorax or pleural effusion. No nodules or masses. AORTIC ARCH: There is no calcific atherosclerosis of the aortic arch. There is no aneurysm, dissection or hemodynamically significant stenosis of the visualized portion of the aorta. Conventional 3 vessel aortic branching pattern. The visualized proximal subclavian arteries are widely patent. RIGHT CAROTID SYSTEM: Normal without aneurysm, dissection or stenosis. LEFT CAROTID SYSTEM: Normal without aneurysm,  dissection or stenosis. VERTEBRAL ARTERIES: Left dominant configuration. Both origins are clearly patent. There is no dissection, occlusion or flow-limiting stenosis to the skull base (V1-V3 segments). CTA HEAD FINDINGS POSTERIOR CIRCULATION: --Vertebral arteries: Normal V4 segments. --Inferior cerebellar arteries: Normal. --Basilar artery: Normal. --Superior cerebellar arteries: Normal. --Posterior cerebral arteries (PCA): Normal. ANTERIOR CIRCULATION: --Intracranial internal carotid arteries: Atherosclerotic calcification of the internal carotid arteries at the skull base without hemodynamically significant stenosis. --Anterior cerebral arteries (ACA): Normal. Both A1 segments are present. Patent anterior communicating artery (a-comm). --Middle cerebral arteries (MCA): Unchanged near occlusive stenosis of the left MCA proximal M2 segment. Normal right MCA. VENOUS SINUSES: As permitted by contrast timing, patent. ANATOMIC VARIANTS: Fetal origin of the right posterior cerebral artery. Patent left P-comm. Review of the MIP images confirms the above findings. CT Brain Perfusion Findings: ASPECTS: 10 CBF (<30%) Volume: 12mL Perfusion (Tmax>6.0s) volume: 47mL Mismatch Volume: 35mL Infarction Location:Posterior left MCA territory. IMPRESSION: 1. Unchanged near occlusive stenosis of the left MCA proximal M2 segment. 2. A 35 mL area of ischemic penumbra in the posterior left MCA territory. These results were called by telephone at the time of interpretation on 09/13/2022 at 7:41 pm to provider [facility] , who verbally acknowledged these results. Electronically Signed   By: Deatra Robinson M.D.   On: 09/13/2022 19:42   CT ANGIO HEAD NECK W WO CM (CODE STROKE)  Result Date: 09/13/2022 CLINICAL DATA:  Provided history: Neuro deficit, acute, stroke suspected. Left MCA syndrome. Right-sided flaccid, aphasic. EXAM: CT ANGIOGRAPHY HEAD AND NECK WITH AND WITHOUT CONTRAST TECHNIQUE: Multidetector CT imaging of the head and  neck was performed using the standard protocol during bolus administration of intravenous contrast. Multiplanar CT image reconstructions and MIPs were obtained to evaluate the vascular anatomy. Carotid stenosis measurements (when applicable) are obtained utilizing NASCET criteria, using the distal internal carotid diameter as the denominator. RADIATION DOSE REDUCTION: This exam was performed according to the departmental dose-optimization program which includes automated exposure control, adjustment of the mA  and/or kV according to patient size and/or use of iterative reconstruction technique. CONTRAST:  75mL OMNIPAQUE IOHEXOL 350 MG/ML SOLN COMPARISON:  Noncontrast head CT performed earlier today 09/13/2022. FINDINGS: CTA NECK FINDINGS Aortic arch: Atherosclerotic plaque within the visualized proximal major branch vessels of the neck. The origins of the innominate, left common carotid and left subclavian arteries are excluded from the field of view. No hemodynamically significant innominate or proximal subclavian artery stenosis at the imaged levels. Right carotid system: CCA and ICA patent within the neck without stenosis. Atherosclerotic plaque about the carotid bifurcation and within the proximal ICA. Left carotid system: The visualized common carotid and internal carotid arteries are patent within the neck without stenosis. Atherosclerotic plaque about the carotid bifurcation and within the proximal ICA. Vertebral arteries: The vertebral arteries patent within the neck. Atherosclerotic plaque at the bilateral vertebral artery origins with up to moderate stenosis. Skeleton: Spondylosis of the cervical and visualized upper thoracic levels. Slight C3-C4 grade 1 retrolisthesis. Other neck: No neck mass or cervical lymphadenopathy. Upper chest: No consolidation within the imaged lung apices. Review of the MIP images confirms the above findings CTA HEAD FINDINGS Anterior circulation: The intracranial internal carotid  arteries are patent. Non-stenotic atherosclerotic plaque within both vessels. The M1 middle cerebral arteries are patent. Thrombus within a proximal M2 left MCA vessel with resultant occlusion or near occlusive stenosis (for instance as seen on series 11, image 20). No right M2 proximal branch occlusion or high-grade proximal stenosis identified. The anterior cerebral arteries are patent. No intracranial aneurysm is identified. Posterior circulation: The intracranial vertebral arteries are patent. The basilar artery is patent. The posterior cerebral arteries are patent. Atherosclerotic irregularity of both vessels without high-grade proximal stenosis. The right PCA is fetal in origin. A left posterior communicating artery is present. Venous sinuses: Evaluation for dural venous sinus thrombosis is limited due to contrast timing. Anatomic variants: As described Review of the MIP images confirms the above findings CTA head impression #1 called by telephone at the time of interpretation on 09/13/2022 at 7:04 pm to provider [facility] , who verbally acknowledged these results. IMPRESSION: CTA neck: 1. The origins of the innominate, left common carotid and left subclavian arteries are excluded from the field of view. 2. The visualized common carotid and internal carotid arteries are patent within the neck without stenosis. Atherosclerotic plaque bilaterally, as described. 3. The vertebral arteries are patent within the neck. Atherosclerotic plaque at both vertebral artery origins with up to moderate stenosis. CTA head: 1. Thrombus within a proximal M2 left middle cerebral artery vessel with resultant occlusion or near occlusive stenosis. 2. Background intracranial atherosclerotic disease, as described. No other proximal branch occlusion or high-grade proximal stenosis is identified. Electronically Signed   By: Jackey Loge D.O.   On: 09/13/2022 19:27   CT HEAD CODE STROKE WO CONTRAST  Result Date:  09/13/2022 CLINICAL DATA:  Code stroke. Neuro deficit, acute, stroke suspected. Right-sided weakness. EXAM: CT HEAD WITHOUT CONTRAST TECHNIQUE: Contiguous axial images were obtained from the base of the skull through the vertex without intravenous contrast. RADIATION DOSE REDUCTION: This exam was performed according to the departmental dose-optimization program which includes automated exposure control, adjustment of the mA and/or kV according to patient size and/or use of iterative reconstruction technique. COMPARISON:  Head CT 02/15/2017. FINDINGS: Brain: Generalized cerebral atrophy. Commensurate prominence of the ventricles and sulci. There is no acute intracranial hemorrhage. No demarcated cortical infarct. No extra-axial fluid collection. No evidence of an intracranial mass. No midline  shift. Vascular: No hyperdense vessel.  Atherosclerotic calcifications. Skull: No fracture or aggressive osseous lesion. Sinuses/Orbits: No mass or acute finding within the imaged orbits. Mild mucosal thickening within the right maxillary sinus. Small mucous retention cyst within the left maxillary sinus. Mild mucosal thickening within the bilateral ethmoid sinuses. ASPECTS [facility] Stroke Program Early CT Score) - Ganglionic level infarction (caudate, lentiform nuclei, internal capsule, insula, M1-M3 cortex): 7 - Supraganglionic infarction (M4-M6 cortex): 3 Total score (0-10 with 10 being normal): 10 No evidence of an acute intracranial abnormality. These results were called by telephone at the time of interpretation on 09/13/2022 at 7:04 pm to provider Dr. Selina Cooley, Who verbally acknowledged these results. IMPRESSION: 1. No evidence of an acute intracranial abnormality. ASPECTS is 10. 2. Generalized cerebral atrophy. 3. Paranasal sinus disease as described. Electronically Signed   By: Jackey Loge D.O.   On: 09/13/2022 19:09     Assessment and Plan:   New onset of atrial fibrillation in the setting of stroke Patient has been  admitted for left MCA artery occlusion status post mechanical thrombectomy and found to be in atrial fibrillation on admission.  He has history of palpitations followed by [facility] cardiology back in 2018 in which he was placed on a Holter monitor which did not note any significant arrhythmias.  Overall he states he is generally asymptomatic from these episodes which is somewhat conflicting based off prior notes here during admission and outpatient office notes.  Rate less than 110 throughout this admission.  Generally between 60-85. Followed by [facility] cardiology. Patient wishes to stay within network and close proximity to his house.  Patient will call for follow up. PTA lopressor 50 mg daily, would consolidate to Toprol XL 50 at discharge or when cleared by neuro.  Discuss cardiac cardioversion outpatient if still in afib.  Given his stroke would need to coordinate with neurology on when he can be placed on anticoagulation. Echocardiogram pending LDL 32, A1c 4.8%  Hypertension Ischemic stroke status post mechanical thrombectomy CKD Chronic hyponatremia  Anemia  UA with hgb, ketones Defer to primary team   Risk Assessment/Risk Scores:   CHA2DS2-VASc Score = 6   This indicates a 9.7% annual risk of stroke. The patient's score is based upon: CHF History: 0 HTN History: 1 Diabetes History: 0 Stroke History: 2 Vascular Disease History: 1 Age Score: 2 Gender Score: 0    For questions or updates, please contact [facility] Please consult [email or web address] for contact info under    Signed, Abagail Kitchens, PA-C  09/14/2022 2:32 PM   Personally seen and examined. Agree with APP above with the following comments:  Briefly Mr. Sela is a 82 year old male with a history of  with a history of HTN and sinus bradycardia seen at the [facility] clinic, Ascending aortic aneurysm NOS, and hyponatremia with alcohol use who presents with new stroke.   Patient was originally admitted with AMS.   This has since resolved.  We was found to have acute stroke, he had a mechanical thrombectomy of the Left MCA, complicated by arterial bleed and R groin pseudoaneurysm.  Patient notes that he is doing fine. No CP, SOB, no palpitations (he is in AF).  Gen: no distress, laying supine Neck: No JVD Cardiac: No Rubs or Gallops, no murmur, IRIR +2 radial pulses Respiratory: Clear to auscultation bilaterally, normal effort, normal  respiratory rate GI: Soft, nontender, non-distended  MS: No  edema;  moves all extremities Integument: Skin feels warm, his R groin  is painless with good distal flow Neuro:  At time of evaluation, alert and oriented to person/place/time/situation   EKG: AF rate 86 with anterior infarct pattern Tele: rate controlled AF with rare PVCs Personally reviewed relevant tests; CT AP from 08/31/2021 shows RA dilation, moderate MAC and aortic atherosclerosis.  Would recommend  - when able from a R groin and stroke standpoint, will start eliquis 5 mg PO BID - when clevipine is weaned off we will restart his home metoprolol - he has an echocardiogram that is pending (complete but not uploaded) - we have discussed his sodium and alcohol use: he drinks (~ 3-4 beers) for about 2 hours every evening, only beer, but has done this for many years since living in [place].  We discussed the relationship between AF and alcohol.  Will work on cessation plan as his care continues - he will follow up with the [facility] cardiology  Riley Lam, MD FASE [facility] Cardiologist [facility]  [facility]  [address] [place], [address] [place], [place] [address] [phone]  3:53 PM

## 2022-09-14 NOTE — Telephone Encounter (Signed)
Pharmacy Patient Advocate Encounter  Insurance verification completed.    The patient is insured through [employer] Medicare Part D   The patient is currently admitted and ran test claims for the following: Eliquis .  Copays and coinsurance results were relayed to Inpatient clinical team.             

## 2022-09-14 NOTE — Progress Notes (Signed)
Due to pt continuing to have BP's higher than target range 120-140 Dr Derry Lory paged. Rn received a verbal order from Dr. Derry Lory to increase Cleviprex from 21 mg/hr max to 32mg /hr max

## 2022-09-14 NOTE — Progress Notes (Cosign Needed Addendum)
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 He is freely moving all extremities at time of presentation to room. He states he is feeling well and wants to return home as quickly as he is able.  Allergies: Patient has no known allergies.  Medications: Prior to Admission medications   Medication Sig Start Date End Date Taking? Authorizing Provider  allopurinol (ZYLOPRIM) 300 MG tablet Take 1 tablet (300 mg total) by mouth daily. 01/27/18   Lyndon Code, MD  aspirin EC 81 MG tablet Take 81 mg by mouth daily.    [provider]  chlorthalidone (HYGROTON) 25 MG tablet Take 25 mg by mouth daily.  11/29/16 11/29/17  [provider]  colchicine 0.6 MG tablet Take 0.6 mg by mouth 2 (two) times daily as needed (gout flares).     [provider]  cyanocobalamin 500 MCG tablet Take 500 mcg by mouth daily. Vitamin B12    [provider]  diphenhydrAMINE (BENADRYL) 25 MG tablet Take 25 mg by mouth at bedtime as needed for sleep.    [provider]  folic acid (FOLVITE) 1 MG tablet Take 1 tablet (1 mg total) by mouth daily. 01/29/17   Mikhail, Nita Sells, DO  furosemide (LASIX) 20 MG tablet Take 1 tablet (20 mg total) by mouth daily. 01/27/18   Lyndon Code, MD  Ginseng 100 MG CAPS Take 100 mg by mouth daily.    [provider]  ketoconazole (NIZORAL) 2 % cream Apply 1 application topically 2 (two) times daily as needed (rash).     [provider]  lisinopril (PRINIVIL,ZESTRIL) 30 MG tablet Take 1 tablet (30 mg total) by mouth daily. 01/27/18   Lyndon Code, MD  metoprolol tartrate (LOPRESSOR) 50 MG tablet Take 25 mg by mouth daily.  06/27/16   [provider]  Multiple Vitamin (MULTIVITAMIN WITH MINERALS) TABS  tablet Take 1 tablet by mouth daily. 01/29/17   Mikhail, Nita Sells, DO  naproxen (NAPROSYN) 500 MG tablet Take 500 mg by mouth 2 (two) times daily as needed (pain).     [provider]  OVER THE COUNTER MEDICATION Take 1 tablet by mouth daily. Super Glucosamine 2000 plus - glucosamine, MSM, Boron, Silica and Hyaluronic Acid, Vitamin C, Manganese, Sodium    [provider]  sildenafil (VIAGRA) 100 MG tablet Take 0.5-1 tablets (50-100 mg total) by mouth daily as needed for erectile dysfunction. 01/27/18   Lyndon Code, MD  thiamine 100 MG tablet Take 1 tablet (100 mg total) by mouth daily. 01/29/17   Mikhail, Nita Sells, DO  tiZANidine (ZANAFLEX) 4 MG tablet Take 1 tablet (4 mg total) by mouth daily as needed for muscle spasms. 01/27/18   Lyndon Code, MD     Vital Signs: BP (!) 125/58   Pulse 88   Temp 98.3 F (36.8 C) (Oral)   Resp 15   Ht 6' (1.829 m)   Wt 184 lb 4.9 oz (83.6 kg)   SpO2 100%   BMI 25.00 kg/m   Physical Exam Vitals reviewed.  Cardiovascular:     Pulses: Normal pulses.     Comments: Distal pulses palpable in all extremities Pulmonary:     Effort: Pulmonary effort is normal.  Musculoskeletal:     Comments: Right groin puncture site is  soft and non-tender. Right radial puncture site soft and non-tender  Skin:    General: Skin is warm and dry.  Neurological:     Mental Status: He is alert and oriented to person, place, and time.  Psychiatric:        Mood and Affect: Mood normal.        Behavior: Behavior normal.        Thought Content: Thought content normal.        Judgment: Judgment normal.    Alert, awake, and oriented x3 Speech and comprehension intact PERRL  EOMs intact, no nystagmus or subjective diplopia. Visual fields intact No facial asymmetry. Tongue midline  Motor power 5/5 in all extremities No pronator drift. Fine motor and coordination intact Gait not assessed Romberg not assessed Heel to toe not assessed   Imaging: MR  BRAIN WO CONTRAST  Result Date: 09/14/2022 CLINICAL DATA:  Left MCA stroke.  Status post left M2 thrombectomy. EXAM: MRI HEAD WITHOUT CONTRAST TECHNIQUE: Multiplanar, multiecho pulse sequences of the brain and surrounding structures were obtained without intravenous contrast. COMPARISON:  CTA head neck 09/13/2022 FINDINGS: Brain: There are 2 small regions of abnormal diffusion restriction within the left anterior cerebral artery territory. No acute ischemia in the left MCA territory. Punctate focus of acute ischemia in the left cerebellum. No acute or chronic hemorrhage. Normal white matter signal, parenchymal volume and CSF spaces. The midline structures are normal. Vascular: Major flow voids are preserved. Skull and upper cervical spine: Normal calvarium and skull base. Visualized upper cervical spine and soft tissues are normal. Sinuses/Orbits:No paranasal sinus fluid levels or advanced mucosal thickening. No mastoid or middle ear effusion. Normal orbits. IMPRESSION: 1. Two small regions of acute ischemia within the left anterior cerebral artery territory. No hemorrhage or mass effect. 2. Punctate focus of acute ischemia in the left cerebellum. 3. No left MCA territory acute ischemia. Electronically Signed   By: Deatra Robinson M.D.   On: 09/14/2022 01:57   CT ANGIO HEAD NECK W WO CM W PERF (CODE STROKE)  Result Date: 09/13/2022 CLINICAL DATA:  Right-sided weakness EXAM: CT ANGIOGRAPHY HEAD AND NECK CT PERFUSION BRAIN TECHNIQUE: Multidetector CT imaging of the head and neck was performed using the standard protocol during bolus administration of intravenous contrast. Multiplanar CT image reconstructions and MIPs were obtained to evaluate the vascular anatomy. Carotid stenosis measurements (when applicable) are obtained utilizing NASCET criteria, using the distal internal carotid diameter as the denominator. Multiphase CT imaging of the brain was performed following IV bolus contrast injection. Subsequent  parametric perfusion maps were calculated using RAPID software. RADIATION DOSE REDUCTION: This exam was performed according to the departmental dose-optimization program which includes automated exposure control, adjustment of the mA and/or kV according to patient size and/or use of iterative reconstruction technique. CONTRAST:  OMNIPAQUE IOHEXOL 350 MG/ML SOLN COMPARISON:  09/13/2022 at 7:03 p.m. FINDINGS: CTA NECK FINDINGS SKELETON: There is no bony spinal canal stenosis. No lytic or blastic lesion. OTHER NECK: Normal pharynx, larynx and major salivary glands. No cervical lymphadenopathy. Unremarkable thyroid gland. UPPER CHEST: No pneumothorax or pleural effusion. No nodules or masses. AORTIC ARCH: There is no calcific atherosclerosis of the aortic arch. There is no aneurysm, dissection or hemodynamically significant stenosis of the visualized portion of the aorta. Conventional 3 vessel aortic branching pattern. The visualized proximal subclavian arteries are widely patent. RIGHT CAROTID SYSTEM: Normal without aneurysm, dissection or stenosis. LEFT CAROTID SYSTEM: Normal without aneurysm, dissection or stenosis. VERTEBRAL ARTERIES: Left dominant configuration.  Both origins are clearly patent. There is no dissection, occlusion or flow-limiting stenosis to the skull base (V1-V3 segments). CTA HEAD FINDINGS POSTERIOR CIRCULATION: --Vertebral arteries: Normal V4 segments. --Inferior cerebellar arteries: Normal. --Basilar artery: Normal. --Superior cerebellar arteries: Normal. --Posterior cerebral arteries (PCA): Normal. ANTERIOR CIRCULATION: --Intracranial internal carotid arteries: Atherosclerotic calcification of the internal carotid arteries at the skull base without hemodynamically significant stenosis. --Anterior cerebral arteries (ACA): Normal. Both A1 segments are present. Patent anterior communicating artery (a-comm). --Middle cerebral arteries (MCA): Unchanged near occlusive stenosis of the left MCA  proximal M2 segment. Normal right MCA. VENOUS SINUSES: As permitted by contrast timing, patent. ANATOMIC VARIANTS: Fetal origin of the right posterior cerebral artery. Patent left P-comm. Review of the MIP images confirms the above findings. CT Brain Perfusion Findings: ASPECTS: 10 CBF (<30%) Volume: 12mL Perfusion (Tmax>6.0s) volume: 47mL Mismatch Volume: 35mL Infarction Location:Posterior left MCA territory. IMPRESSION: 1. Unchanged near occlusive stenosis of the left MCA proximal M2 segment. 2. A 35 mL area of ischemic penumbra in the posterior left MCA territory. These results were called by telephone at the time of interpretation on 09/13/2022 at 7:41 pm to provider [facility] , who verbally acknowledged these results. Electronically Signed   By: Deatra Robinson M.D.   On: 09/13/2022 19:42   CT ANGIO HEAD NECK W WO CM (CODE STROKE)  Result Date: 09/13/2022 CLINICAL DATA:  Provided history: Neuro deficit, acute, stroke suspected. Left MCA syndrome. Right-sided flaccid, aphasic. EXAM: CT ANGIOGRAPHY HEAD AND NECK WITH AND WITHOUT CONTRAST TECHNIQUE: Multidetector CT imaging of the head and neck was performed using the standard protocol during bolus administration of intravenous contrast. Multiplanar CT image reconstructions and MIPs were obtained to evaluate the vascular anatomy. Carotid stenosis measurements (when applicable) are obtained utilizing NASCET criteria, using the distal internal carotid diameter as the denominator. RADIATION DOSE REDUCTION: This exam was performed according to the departmental dose-optimization program which includes automated exposure control, adjustment of the mA and/or kV according to patient size and/or use of iterative reconstruction technique. CONTRAST:  75mL OMNIPAQUE IOHEXOL 350 MG/ML SOLN COMPARISON:  Noncontrast head CT performed earlier today 09/13/2022. FINDINGS: CTA NECK FINDINGS Aortic arch: Atherosclerotic plaque within the visualized proximal major branch vessels  of the neck. The origins of the innominate, left common carotid and left subclavian arteries are excluded from the field of view. No hemodynamically significant innominate or proximal subclavian artery stenosis at the imaged levels. Right carotid system: CCA and ICA patent within the neck without stenosis. Atherosclerotic plaque about the carotid bifurcation and within the proximal ICA. Left carotid system: The visualized common carotid and internal carotid arteries are patent within the neck without stenosis. Atherosclerotic plaque about the carotid bifurcation and within the proximal ICA. Vertebral arteries: The vertebral arteries patent within the neck. Atherosclerotic plaque at the bilateral vertebral artery origins with up to moderate stenosis. Skeleton: Spondylosis of the cervical and visualized upper thoracic levels. Slight C3-C4 grade 1 retrolisthesis. Other neck: No neck mass or cervical lymphadenopathy. Upper chest: No consolidation within the imaged lung apices. Review of the MIP images confirms the above findings CTA HEAD FINDINGS Anterior circulation: The intracranial internal carotid arteries are patent. Non-stenotic atherosclerotic plaque within both vessels. The M1 middle cerebral arteries are patent. Thrombus within a proximal M2 left MCA vessel with resultant occlusion or near occlusive stenosis (for instance as seen on series 11, image 20). No right M2 proximal branch occlusion or high-grade proximal stenosis identified. The anterior cerebral arteries are patent. No intracranial aneurysm is  identified. Posterior circulation: The intracranial vertebral arteries are patent. The basilar artery is patent. The posterior cerebral arteries are patent. Atherosclerotic irregularity of both vessels without high-grade proximal stenosis. The right PCA is fetal in origin. A left posterior communicating artery is present. Venous sinuses: Evaluation for dural venous sinus thrombosis is limited due to contrast  timing. Anatomic variants: As described Review of the MIP images confirms the above findings CTA head impression #1 called by telephone at the time of interpretation on 09/13/2022 at 7:04 pm to provider [facility] , who verbally acknowledged these results. IMPRESSION: CTA neck: 1. The origins of the innominate, left common carotid and left subclavian arteries are excluded from the field of view. 2. The visualized common carotid and internal carotid arteries are patent within the neck without stenosis. Atherosclerotic plaque bilaterally, as described. 3. The vertebral arteries are patent within the neck. Atherosclerotic plaque at both vertebral artery origins with up to moderate stenosis. CTA head: 1. Thrombus within a proximal M2 left middle cerebral artery vessel with resultant occlusion or near occlusive stenosis. 2. Background intracranial atherosclerotic disease, as described. No other proximal branch occlusion or high-grade proximal stenosis is identified. Electronically Signed   By: Jackey Loge D.O.   On: 09/13/2022 19:27   CT HEAD CODE STROKE WO CONTRAST  Result Date: 09/13/2022 CLINICAL DATA:  Code stroke. Neuro deficit, acute, stroke suspected. Right-sided weakness. EXAM: CT HEAD WITHOUT CONTRAST TECHNIQUE: Contiguous axial images were obtained from the base of the skull through the vertex without intravenous contrast. RADIATION DOSE REDUCTION: This exam was performed according to the departmental dose-optimization program which includes automated exposure control, adjustment of the mA and/or kV according to patient size and/or use of iterative reconstruction technique. COMPARISON:  Head CT 02/15/2017. FINDINGS: Brain: Generalized cerebral atrophy. Commensurate prominence of the ventricles and sulci. There is no acute intracranial hemorrhage. No demarcated cortical infarct. No extra-axial fluid collection. No evidence of an intracranial mass. No midline shift. Vascular: No hyperdense vessel.   Atherosclerotic calcifications. Skull: No fracture or aggressive osseous lesion. Sinuses/Orbits: No mass or acute finding within the imaged orbits. Mild mucosal thickening within the right maxillary sinus. Small mucous retention cyst within the left maxillary sinus. Mild mucosal thickening within the bilateral ethmoid sinuses. ASPECTS [facility] Stroke Program Early CT Score) - Ganglionic level infarction (caudate, lentiform nuclei, internal capsule, insula, M1-M3 cortex): 7 - Supraganglionic infarction (M4-M6 cortex): 3 Total score (0-10 with 10 being normal): 10 No evidence of an acute intracranial abnormality. These results were called by telephone at the time of interpretation on 09/13/2022 at 7:04 pm to provider Dr. Selina Cooley, Who verbally acknowledged these results. IMPRESSION: 1. No evidence of an acute intracranial abnormality. ASPECTS is 10. 2. Generalized cerebral atrophy. 3. Paranasal sinus disease as described. Electronically Signed   By: Jackey Loge D.O.   On: 09/13/2022 19:09    Labs:  CBC: Recent Labs    09/13/22 1842 09/13/22 1849 09/14/22 0531  WBC 5.3  --  7.1  HGB 12.1* 12.6* 10.4*  HCT 38.5* 37.0* 32.2*  PLT 145*  --  152    COAGS: Recent Labs    09/13/22 1842  INR 1.3*  APTT 33    BMP: Recent Labs    09/13/22 1842 09/13/22 1849 09/14/22 0531  NA 130* 132* 128*  K 4.6 5.2* 3.5  CL 102 103 104  CO2 18*  --  16*  GLUCOSE 105* 98 117*  BUN 28* 37* 22  CALCIUM 8.9  --  8.2*  CREATININE 1.50* 1.40* 1.35*  GFRNONAA 47*  --  53*    LIVER FUNCTION TESTS: Recent Labs    09/13/22 1842  BILITOT 0.7  AST 19  ALT 10  ALKPHOS 82  PROT 6.9  ALBUMIN 3.5    Assessment and Plan:  Albon Bogdanski is a pleasant 82 yo male being seen today in relation to 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 Patient is doing well at this time with no focal neurological deficits identified on exam. No further NIR needs identified at this time, further  management per Neurology team. [facility] service will continue to follow.    ADDENDUM:  Provider was called to room at 1024 for arterial bleeding from right groin puncture site. Patient had sat up in bed with aid of PT/OT when pulsatile, bright red bleeding was noted from right groin site. Patient was laid flat and immediate pressure was applied. Upon my presentation to patient's room, patient was having reduced but still present arterial bleeding. Pressure was held on femoral artery proximal to groin puncture site and QuickClot was applied to patient's site. After approximately 40 minutes from onset of bleeding, hemostasis was achieved. Distal posterior tibial pulse palpable, dorsalis pedis pulse is able to be found with Doppler. Patient with intact motor and sensory function in right lower extremity. Patient's blood pressure was in 140s/60s following hemostasis. Significant hematoma noted around right groin site, patient states it is non-tender at this time. He reports no chest pain, no back pain, no difficulty breathing.  Plan -Target systolic BP in 120s -Patient to lay flat in bed with knee immobilizer on right lower extremity. 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

## 2022-09-14 NOTE — Progress Notes (Addendum)
STROKE TEAM PROGRESS NOTE   INTERVAL HISTORY No family is at the bedside this morning on assessment.   Patient s/p left M2 thrombectomy yesterday with TICI 3 revascularization. Patient remained hypertensive overnight requiring cleviprex gtt.   Patient reports multiple evenings of recent where he feels palpitations and "can hear my heartbeat in my head".  Denies diagnosis of atrial fibrillation in the past.  He does state that he believes he was on Eliquis at one point in time in the past but is unable to clarify any indication for this medication.  Requested cardiology consult for new onset A-fib.  MRI brain reveals 2 small regions of acute ischemia within the left anterior cerebral artery territory.  No hemorrhage or mass effect.  Punctate focus of acute ischemia in the left cerebellum.  No left MCA territory acute ischemia.  Vitals:   09/14/22 0645 09/14/22 0700 09/14/22 0715 09/14/22 0730  BP:  (!) 125/58    Pulse: 83 80 85 88  Resp: 13 (!) 22 16 15   Temp:      TempSrc:      SpO2: 100% 100% 100% 100%  Weight:      Height:       CBC:  Recent Labs  Lab 09/13/22 1842 09/13/22 1849 09/14/22 0531  WBC 5.3  --  7.1  NEUTROABS 3.9  --  5.2  HGB 12.1* 12.6* 10.4*  HCT 38.5* 37.0* 32.2*  MCV 101.3*  --  97.9  PLT 145*  --  152   Basic Metabolic Panel:  Recent Labs  Lab 09/13/22 1842 09/13/22 1849 09/14/22 0531  NA 130* 132* 128*  K 4.6 5.2* 3.5  CL 102 103 104  CO2 18*  --  16*  GLUCOSE 105* 98 117*  BUN 28* 37* 22  CREATININE 1.50* 1.40* 1.35*  CALCIUM 8.9  --  8.2*   Lipid Panel:  Recent Labs  Lab 09/14/22 0531  CHOL 119  TRIG 86  HDL 70  CHOLHDL 1.7  VLDL 17  LDLCALC 32   HgbA1c:  Recent Labs  Lab 09/14/22 0531  HGBA1C 4.8   Urine Drug Screen:  Recent Labs  Lab 09/14/22 0531  LABOPIA NONE DETECTED  COCAINSCRNUR NONE DETECTED  LABBENZ NONE DETECTED  AMPHETMU NONE DETECTED  THCU NONE DETECTED  LABBARB NONE DETECTED    Alcohol Level  Recent  Labs  Lab 09/13/22 1842  ETH <10   IMAGING past 24 hours MR BRAIN WO CONTRAST  Result Date: 09/14/2022 CLINICAL DATA:  Left MCA stroke.  Status post left M2 thrombectomy. EXAM: MRI HEAD WITHOUT CONTRAST TECHNIQUE: Multiplanar, multiecho pulse sequences of the brain and surrounding structures were obtained without intravenous contrast. COMPARISON:  CTA head neck 09/13/2022 FINDINGS: Brain: There are 2 small regions of abnormal diffusion restriction within the left anterior cerebral artery territory. No acute ischemia in the left MCA territory. Punctate focus of acute ischemia in the left cerebellum. No acute or chronic hemorrhage. Normal white matter signal, parenchymal volume and CSF spaces. The midline structures are normal. Vascular: Major flow voids are preserved. Skull and upper cervical spine: Normal calvarium and skull base. Visualized upper cervical spine and soft tissues are normal. Sinuses/Orbits:No paranasal sinus fluid levels or advanced mucosal thickening. No mastoid or middle ear effusion. Normal orbits. IMPRESSION: 1. Two small regions of acute ischemia within the left anterior cerebral artery territory. No hemorrhage or mass effect. 2. Punctate focus of acute ischemia in the left cerebellum. 3. No left MCA territory acute ischemia. Electronically Signed  By: Deatra Robinson M.D.   On: 09/14/2022 01:57   CT ANGIO HEAD NECK W WO CM W PERF (CODE STROKE)  Result Date: 09/13/2022 CLINICAL DATA:  Right-sided weakness EXAM: CT ANGIOGRAPHY HEAD AND NECK CT PERFUSION BRAIN TECHNIQUE: Multidetector CT imaging of the head and neck was performed using the standard protocol during bolus administration of intravenous contrast. Multiplanar CT image reconstructions and MIPs were obtained to evaluate the vascular anatomy. Carotid stenosis measurements (when applicable) are obtained utilizing NASCET criteria, using the distal internal carotid diameter as the denominator. Multiphase CT imaging of the brain  was performed following IV bolus contrast injection. Subsequent parametric perfusion maps were calculated using RAPID software. RADIATION DOSE REDUCTION: This exam was performed according to the departmental dose-optimization program which includes automated exposure control, adjustment of the mA and/or kV according to patient size and/or use of iterative reconstruction technique. CONTRAST:  OMNIPAQUE IOHEXOL 350 MG/ML SOLN COMPARISON:  09/13/2022 at 7:03 p.m. FINDINGS: CTA NECK FINDINGS SKELETON: There is no bony spinal canal stenosis. No lytic or blastic lesion. OTHER NECK: Normal pharynx, larynx and major salivary glands. No cervical lymphadenopathy. Unremarkable thyroid gland. UPPER CHEST: No pneumothorax or pleural effusion. No nodules or masses. AORTIC ARCH: There is no calcific atherosclerosis of the aortic arch. There is no aneurysm, dissection or hemodynamically significant stenosis of the visualized portion of the aorta. Conventional 3 vessel aortic branching pattern. The visualized proximal subclavian arteries are widely patent. RIGHT CAROTID SYSTEM: Normal without aneurysm, dissection or stenosis. LEFT CAROTID SYSTEM: Normal without aneurysm, dissection or stenosis. VERTEBRAL ARTERIES: Left dominant configuration. Both origins are clearly patent. There is no dissection, occlusion or flow-limiting stenosis to the skull base (V1-V3 segments). CTA HEAD FINDINGS POSTERIOR CIRCULATION: --Vertebral arteries: Normal V4 segments. --Inferior cerebellar arteries: Normal. --Basilar artery: Normal. --Superior cerebellar arteries: Normal. --Posterior cerebral arteries (PCA): Normal. ANTERIOR CIRCULATION: --Intracranial internal carotid arteries: Atherosclerotic calcification of the internal carotid arteries at the skull base without hemodynamically significant stenosis. --Anterior cerebral arteries (ACA): Normal. Both A1 segments are present. Patent anterior communicating artery (a-comm). --Middle cerebral  arteries (MCA): Unchanged near occlusive stenosis of the left MCA proximal M2 segment. Normal right MCA. VENOUS SINUSES: As permitted by contrast timing, patent. ANATOMIC VARIANTS: Fetal origin of the right posterior cerebral artery. Patent left P-comm. Review of the MIP images confirms the above findings. CT Brain Perfusion Findings: ASPECTS: 10 CBF (<30%) Volume: 12mL Perfusion (Tmax>6.0s) volume: 47mL Mismatch Volume: 35mL Infarction Location:Posterior left MCA territory. IMPRESSION: 1. Unchanged near occlusive stenosis of the left MCA proximal M2 segment. 2. A 35 mL area of ischemic penumbra in the posterior left MCA territory. These results were called by telephone at the time of interpretation on 09/13/2022 at 7:41 pm to provider [facility] , who verbally acknowledged these results. Electronically Signed   By: Deatra Robinson M.D.   On: 09/13/2022 19:42   CT ANGIO HEAD NECK W WO CM (CODE STROKE)  Result Date: 09/13/2022 CLINICAL DATA:  Provided history: Neuro deficit, acute, stroke suspected. Left MCA syndrome. Right-sided flaccid, aphasic. EXAM: CT ANGIOGRAPHY HEAD AND NECK WITH AND WITHOUT CONTRAST TECHNIQUE: Multidetector CT imaging of the head and neck was performed using the standard protocol during bolus administration of intravenous contrast. Multiplanar CT image reconstructions and MIPs were obtained to evaluate the vascular anatomy. Carotid stenosis measurements (when applicable) are obtained utilizing NASCET criteria, using the distal internal carotid diameter as the denominator. RADIATION DOSE REDUCTION: This exam was performed according to the departmental dose-optimization program which includes  automated exposure control, adjustment of the mA and/or kV according to patient size and/or use of iterative reconstruction technique. CONTRAST:  75mL OMNIPAQUE IOHEXOL 350 MG/ML SOLN COMPARISON:  Noncontrast head CT performed earlier today 09/13/2022. FINDINGS: CTA NECK FINDINGS Aortic arch:  Atherosclerotic plaque within the visualized proximal major branch vessels of the neck. The origins of the innominate, left common carotid and left subclavian arteries are excluded from the field of view. No hemodynamically significant innominate or proximal subclavian artery stenosis at the imaged levels. Right carotid system: CCA and ICA patent within the neck without stenosis. Atherosclerotic plaque about the carotid bifurcation and within the proximal ICA. Left carotid system: The visualized common carotid and internal carotid arteries are patent within the neck without stenosis. Atherosclerotic plaque about the carotid bifurcation and within the proximal ICA. Vertebral arteries: The vertebral arteries patent within the neck. Atherosclerotic plaque at the bilateral vertebral artery origins with up to moderate stenosis. Skeleton: Spondylosis of the cervical and visualized upper thoracic levels. Slight C3-C4 grade 1 retrolisthesis. Other neck: No neck mass or cervical lymphadenopathy. Upper chest: No consolidation within the imaged lung apices. Review of the MIP images confirms the above findings CTA HEAD FINDINGS Anterior circulation: The intracranial internal carotid arteries are patent. Non-stenotic atherosclerotic plaque within both vessels. The M1 middle cerebral arteries are patent. Thrombus within a proximal M2 left MCA vessel with resultant occlusion or near occlusive stenosis (for instance as seen on series 11, image 20). No right M2 proximal branch occlusion or high-grade proximal stenosis identified. The anterior cerebral arteries are patent. No intracranial aneurysm is identified. Posterior circulation: The intracranial vertebral arteries are patent. The basilar artery is patent. The posterior cerebral arteries are patent. Atherosclerotic irregularity of both vessels without high-grade proximal stenosis. The right PCA is fetal in origin. A left posterior communicating artery is present. Venous sinuses:  Evaluation for dural venous sinus thrombosis is limited due to contrast timing. Anatomic variants: As described Review of the MIP images confirms the above findings CTA head impression #1 called by telephone at the time of interpretation on 09/13/2022 at 7:04 pm to provider [facility] , who verbally acknowledged these results. IMPRESSION: CTA neck: 1. The origins of the innominate, left common carotid and left subclavian arteries are excluded from the field of view. 2. The visualized common carotid and internal carotid arteries are patent within the neck without stenosis. Atherosclerotic plaque bilaterally, as described. 3. The vertebral arteries are patent within the neck. Atherosclerotic plaque at both vertebral artery origins with up to moderate stenosis. CTA head: 1. Thrombus within a proximal M2 left middle cerebral artery vessel with resultant occlusion or near occlusive stenosis. 2. Background intracranial atherosclerotic disease, as described. No other proximal branch occlusion or high-grade proximal stenosis is identified. Electronically Signed   By: Jackey Loge D.O.   On: 09/13/2022 19:27   CT HEAD CODE STROKE WO CONTRAST  Result Date: 09/13/2022 CLINICAL DATA:  Code stroke. Neuro deficit, acute, stroke suspected. Right-sided weakness. EXAM: CT HEAD WITHOUT CONTRAST TECHNIQUE: Contiguous axial images were obtained from the base of the skull through the vertex without intravenous contrast. RADIATION DOSE REDUCTION: This exam was performed according to the departmental dose-optimization program which includes automated exposure control, adjustment of the mA and/or kV according to patient size and/or use of iterative reconstruction technique. COMPARISON:  Head CT 02/15/2017. FINDINGS: Brain: Generalized cerebral atrophy. Commensurate prominence of the ventricles and sulci. There is no acute intracranial hemorrhage. No demarcated cortical infarct. No extra-axial fluid collection. No  evidence of an  intracranial mass. No midline shift. Vascular: No hyperdense vessel.  Atherosclerotic calcifications. Skull: No fracture or aggressive osseous lesion. Sinuses/Orbits: No mass or acute finding within the imaged orbits. Mild mucosal thickening within the right maxillary sinus. Small mucous retention cyst within the left maxillary sinus. Mild mucosal thickening within the bilateral ethmoid sinuses. ASPECTS [facility] Stroke Program Early CT Score) - Ganglionic level infarction (caudate, lentiform nuclei, internal capsule, insula, M1-M3 cortex): 7 - Supraganglionic infarction (M4-M6 cortex): 3 Total score (0-10 with 10 being normal): 10 No evidence of an acute intracranial abnormality. These results were called by telephone at the time of interpretation on 09/13/2022 at 7:04 pm to provider Dr. Selina Cooley, Who verbally acknowledged these results. IMPRESSION: 1. No evidence of an acute intracranial abnormality. ASPECTS is 10. 2. Generalized cerebral atrophy. 3. Paranasal sinus disease as described. Electronically Signed   By: Jackey Loge D.O.   On: 09/13/2022 19:09    PHYSICAL EXAM Constitutional: Elderly Caucasian male who appears well-developed and well-nourished.  Psych: Affect appropriate to situation, calm and cooperative with exam Eyes: No scleral injection HENT: No OP obstrucion MSK: no joint deformities or swelling Cardiovascular: Irregular rate and rhythm, atrial fibrillation on cardiac monitor Respiratory: Effort normal, non-labored breathing GI: Soft.  No distension. There is no tenderness.  Skin: WDI  Neuro: Mental Status: Patient is awake, alert, oriented to person, place, month, year, and situation. Patient is able to give a clear and coherent history. No signs of aphasia or neglect.  No dysarthria noted.  Speech is fluent repetition and comprehension intact. Cranial Nerves: II: Visual Fields are full.  PERRL. III,IV, VI: EOMI, tracks examiner throughout visual fields. V: Facial sensation is  intact and symmetric to light touch VII: Facial movement is symmetric resting and with movement VIII: Hearing is intact to voice X: Palate elevates symmetrically XI: Shoulder shrug is symmetric. XII: Tongue protrudes midline without atrophy or fasciculations.  Motor: Tone is normal. Bulk is normal. 5/5 strength was present in all four extremities without unilateral weakness or vertical drift. Sensory: Sensation is symmetric to light touch and temperature in the arms and legs. No extinction to DSS present.  Cerebellar: FNF and HKS are intact bilaterally  NIHSS of 0   ASSESSMENT/PLAN Mr. [name] is a 82 y.o. male with history of HTN, AAA, EtOH abuse presenting with right hemiplegia and aphasia with findings of occlusion/near-occlusion of the proximal left M2 MCA on vessel imaging.  Patient was not a candidate for TNK as he presented outside of the thrombolytic therapy time window from onset of symptoms but he was a candidate for mechanical thrombectomy.    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 ASPECTS is 10.  Generalized cerebral atrophy.  CTA head & neck  CTA neck: The origin of the innominate, left common carotid, and left subclavian arteries are excluded from the field-of-view.  The visualized common carotid and internal carotid arteries are patent within the neck without stenosis.  Atherosclerotic plaque bilaterally.  The vertebral arteries are patent within the neck.  Atherosclerotic plaque at both vertebral artery origins with up to moderate stenosis. 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  Background intracranial atherosclerotic disease, as described no other  proximal branch occlusion or high-grade  proximal stenosis is identified. 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  A 35 mL area of ischemic penumbra in the posterior left MCA territory. MRI brain without contrast: Two small regions of acute ischemia within the left anterior cerebral artery territory.  No hemorrhage or mass effect.  Punctate focus of acute ischemia in the left cerebellum.  No left MCA territory acute ischemia. 2D Echo pending LDL 32, at goal of < 70  HgbA1c 4.8 VTE prophylaxis - SCDs  Consult cardiology for new onset atrial fibrillation     Diet   Diet Heart Room service appropriate? Yes with Assist; Fluid consistency: Thin   aspirin 81 mg daily prior to admission, now on Eliquis (apixaban) daily in the setting of new onset atrial fibrillation  Therapy recommendations:  pending  Disposition:  pending   New onset atrial fibrillation Eliquis dosing per pharmacy consult Cardiology consulted  Hypertension Home meds: lisinopril, furosemide, metoprolol tartrate Unstable, requiring cleviprex gtt  Add lisinopril 20 mg PO daily PRN hydralazine and labetalol for ongoing hypertension  SBP 120-160 mmHg for 24 hours after intervention followed by permissive hypertension  Long-term BP goal normotensive  Other Stroke Risk Factors Advanced Age >/= 11   Other Active Problems   Hospital day # 1  Lanae Boast, AGACNP-BC [facility] Pager: [phone]  STROKE MD NOTE :  I have personally obtained history,examined this patient, reviewed notes, independently viewed imaging studies, participated in medical decision making and plan of care.ROS completed by me personally and pertinent positives fully documented  I have made any additions or clarifications directly to the above note. Agree with note above.  Patient presented with sudden onset of aphasia and right hemiparesis due to left M2 occlusion and was  treated with mechanical thrombectomy with excellent revascularization and clinical recovery.  MRI shows only tiny punctate left ACA and left cerebellar infarcts and clinical exam shows no deficits.  He has new onset A-fib will need anticoagulation with Eliquis at discharge.  Recommend close neurological observation and strict blood pressure control with systolic goal 120-160 for the first 24 hours as per post thrombectomy protocol.  Mobilize out of bed.  Therapy consults.  Continue ongoing stroke workup.  Long discussion with patient and answered questions.  Discussed with Dr. Corliss Skains.This patient is critically ill and at significant risk of neurological worsening, death and care requires constant monitoring of vital signs, hemodynamics,respiratory and cardiac monitoring, extensive review of multiple databases, frequent neurological assessment, discussion with family, other specialists and medical decision making of high complexity.I have made any additions or clarifications directly to the above note.This critical care time does not reflect procedure time, or teaching time or supervisory time of PA/NP/Med Resident etc but could involve care discussion time.  I spent 30 minutes of neurocritical care time  in the care of  this patient.      Delia Heady, MD Medical Director [facility] Pager: [phone] 09/14/2022 3:19 PM  To contact Stroke Continuity provider, please refer to [email or web address]. After hours, contact General Neurology

## 2022-09-14 NOTE — Progress Notes (Signed)
SLP Cancellation Note  Patient Details Name: [name] MRN: [id number] DOB: 10-03-1940   Cancelled treatment:       Reason Eval/Treat Not Completed: Patient at procedure or test/unavailable  Ferdinand Lango MA, CCC-SLP  [name] Meryl 09/14/2022, 11:40 AM

## 2022-09-15 DIAGNOSIS — I63512 Cerebral infarction due to unspecified occlusion or stenosis of left middle cerebral artery: Secondary | ICD-10-CM | POA: Diagnosis not present

## 2022-09-15 MED ORDER — ONDANSETRON HCL 4 MG/2ML IJ SOLN: 4.0000 mg | Freq: Four times a day (QID) | INTRAMUSCULAR | Status: DC | PRN

## 2022-09-15 MED ORDER — TIZANIDINE HCL 4 MG PO TABS
4.0000 mg | ORAL_TABLET | Freq: Once | ORAL | Status: AC
  Administered 2022-09-15: 4 mg via ORAL
  Filled 2022-09-15: qty 1

## 2022-09-15 MED ORDER — METOPROLOL TARTRATE 25 MG PO TABS
25.0000 mg | ORAL_TABLET | Freq: Two times a day (BID) | ORAL | Status: DC
  Administered 2022-09-15 – 2022-09-16 (×3): 25 mg via ORAL
  Filled 2022-09-15 (×3): qty 1

## 2022-09-15 MED ORDER — ONDANSETRON HCL 4 MG/2ML IJ SOLN
INTRAMUSCULAR | Status: AC
  Administered 2022-09-15: 4 mg via INTRAVENOUS
  Filled 2022-09-15: qty 2

## 2022-09-15 MED ORDER — CLEVIDIPINE BUTYRATE 0.5 MG/ML IV EMUL
0.0000 mg/h | INTRAVENOUS | Status: AC
  Administered 2022-09-15: 17 mg/h via INTRAVENOUS
  Administered 2022-09-15: 18 mg/h via INTRAVENOUS
  Filled 2022-09-15 (×3): qty 100

## 2022-09-15 NOTE — Progress Notes (Signed)
Progress Note  Patient Name: [name] Date of Encounter: 09/15/2022  Primary Cardiologist: New to Dr. Izora Ribas, Plans to re-establish with [facility] (Dr. Gwen Pounds)  Subjective   He is not yet cleared for DOAC administration, Echo showed evidence of LVOT gradient (no evidence of oHCM) in the setting of dynamic gradient. No CP, SOB, Palpitations.  Able to sit up a bit form yesterday  Inpatient Medications    Scheduled Meds:  Chlorhexidine Gluconate Cloth  6 each Topical Daily   lisinopril  20 mg Oral Daily   Continuous Infusions:  sodium chloride     clevidipine 18 mg/hr (09/15/22 0800)   PRN Meds: acetaminophen **OR** acetaminophen (TYLENOL) oral liquid 160 mg/5 mL **OR** acetaminophen, eptifibatide, heparin sodium (porcine), hydrALAZINE, labetalol, nitroGLYCERIN 100 mcg in sodium chloride (PF) 0.9 % 59.72 mL (25 mcg/mL) syringe, ondansetron (ZOFRAN) IV, mouth rinse, senna-docusate, verapamil   Vital Signs    Vitals:   09/15/22 0700 09/15/22 0730 09/15/22 0746 09/15/22 0800  BP: 124/67 125/65 129/64 130/83  Pulse: 79 82 81 96  Resp: 17 19 19  (!) 25  Temp:    98.3 F (36.8 C)  TempSrc:    Oral  SpO2: 91% 92% 93% 94%  Weight:      Height:        Intake/Output Summary (Last 24 hours) at 09/15/2022 0901 Last data filed at 09/15/2022 0800 Gross per 24 hour  Intake 1097.5 ml  Output 700 ml  Net 397.5 ml   Filed Weights   09/13/22 1800  Weight: 83.6 kg    Telemetry    AF with rare PACs - Personally Reviewed  Physical Exam   Gen: no distress  Neck: No JVD Cardiac: No Rubs or Gallops, IRIR no murmur, improving R groin hematoma Respiratory: Clear to auscultation bilaterally, normal effort, normal  respiratory rate GI: Soft, nontender, non-distended  MS: No  edema;  moves all extremities Integument: Skin feels warm Neuro:  At time of evaluation, alert and oriented to person/place/time/situation    Labs    Chemistry Recent Labs  Lab  09/13/22 1842 09/13/22 1849 09/14/22 0531  NA 130* 132* 128*  K 4.6 5.2* 3.5  CL 102 103 104  CO2 18*  --  16*  GLUCOSE 105* 98 117*  BUN 28* 37* 22  CREATININE 1.50* 1.40* 1.35*  CALCIUM 8.9  --  8.2*  PROT 6.9  --   --   ALBUMIN 3.5  --   --   AST 19  --   --   ALT 10  --   --   ALKPHOS 82  --   --   BILITOT 0.7  --   --   GFRNONAA 47*  --  53*  ANIONGAP 10  --  8     Hematology Recent Labs  Lab 09/13/22 1842 09/13/22 1849 09/14/22 0531  WBC 5.3  --  7.1  RBC 3.80*  --  3.29*  HGB 12.1* 12.6* 10.4*  HCT 38.5* 37.0* 32.2*  MCV 101.3*  --  97.9  MCH 31.8  --  31.6  MCHC 31.4  --  32.3  RDW 16.3*  --  16.3*  PLT 145*  --  152    Cardiac EnzymesNo results for input(s): "TROPONINI" in the last 168 hours. No results for input(s): "TROPIPOC" in the last 168 hours.   BNPNo results for input(s): "BNP", "PROBNP" in the last 168 hours.   DDimer No results for input(s): "DDIMER" in the last 168 hours.  Radiology    VAS [place] GROIN PSEUDOANEURYSM  Result Date: 09/14/2022  ARTERIAL PSEUDOANEURYSM  Patient Name:  [name]  Date of Exam:   09/14/2022 Medical Rec #: [id number]         Accession #:    [id number] Date of Birth: 04-15-1941         Patient Gender: M Patient Age:   82 years Exam Location:  [facility] Procedure:      VAS [place] Bobetta Lime Referring Phys: Julieanne Cotton --------------------------------------------------------------------------------  Exam: Right groin Indications: Patient complains of bleeding post IR procedure. History: S/P Left MCA mechanical thrombectomy. Limitations: Post procedure pressure dressing Comparison Study: No previous exams Performing Technologist: Jody Hill RVT, RDMS  Examination Guidelines: A complete evaluation includes B-mode imaging, spectral Doppler, color Doppler, and power Doppler as needed of all accessible portions of each vessel. Bilateral testing is considered an integral part of a complete examination.  Limited examinations for reoccurring indications may be performed as noted. +------------+----------+---------+------+----------+ Right DuplexPSV (cm/s)Waveform PlaqueComment(s) +------------+----------+---------+------+----------+ CFA            143    biphasic                  +------------+----------+---------+------+----------+ PFA             66    biphasic                  +------------+----------+---------+------+----------+ Prox SFA        83    triphasic                 +------------+----------+---------+------+----------+ Right Vein comments:CFV patent with phasic flow.  Summary: No evidence of pseudoaneurysm, AVF or DVT  Diagnosing physician: Gerarda Fraction Electronically signed by Gerarda Fraction on 09/14/2022 at 5:38:17 PM.    --------------------------------------------------------------------------------    Final    ECHOCARDIOGRAM COMPLETE  Result Date: 09/14/2022    ECHOCARDIOGRAM REPORT   Patient Name:   [name] Date of Exam: 09/14/2022 Medical Rec #:  [id number]        Height:       72.0 in Accession #:    [id number]       Weight:       184.3 lb Date of Birth:  1941-03-06        BSA:          2.058 m Patient Age:    81 years         BP:           125/57 mmHg Patient Gender: M                HR:           74 bpm. Exam Location:  Inpatient Procedure: 2D Echo, Cardiac Doppler and Color Doppler Indications:    Stroke  History:        Patient has prior history of Echocardiogram examinations, most                 recent 02/25/2021. Stroke and AKI, Mitral Valve Disease,                 Arrythmias:Bradycardia, Signs/Symptoms:Edema; Risk                 Factors:Hypertension and alcohol abuse.  Sonographer:    Wallie Char Referring Phys: [id number] Malachi Carl STACK IMPRESSIONS  1. Peak outflow velocity 3.3 m/s, peak gradient 45 mmHg. Left ventricular ejection fraction, by estimation, is >75%. The  left ventricle has hyperdynamic function. The left ventricle has no regional wall  motion abnormalities. There is mild left ventricular hypertrophy. Left ventricular diastolic parameters are indeterminate.  2. Right ventricular systolic function is normal. The right ventricular size is normal. There is mildly elevated pulmonary artery systolic pressure. The estimated right ventricular systolic pressure is 44.4 mmHg.  3. Left atrial size was mildly dilated.  4. The mitral valve is normal in structure. Mild mitral valve regurgitation. No evidence of mitral stenosis. Moderate mitral annular calcification.  5. The aortic valve is normal in structure. There is mild calcification of the aortic valve. Aortic valve regurgitation is not visualized. Aortic valve sclerosis is present, with no evidence of aortic valve stenosis. Aortic valve Vmax measures 1.22 m/s.  6. The inferior vena cava is dilated in size with <50% respiratory variability, suggesting right atrial pressure of 15 mmHg. FINDINGS  Left Ventricle: Peak outflow velocity 3.3 m/s, peak gradient 45 mmHg. Left ventricular ejection fraction, by estimation, is >75%. The left ventricle has hyperdynamic function. The left ventricle has no regional wall motion abnormalities. The left ventricular internal cavity size was normal in size. There is mild left ventricular hypertrophy. Left ventricular diastolic parameters are indeterminate. Right Ventricle: The right ventricular size is normal. No increase in right ventricular wall thickness. Right ventricular systolic function is normal. There is mildly elevated pulmonary artery systolic pressure. The tricuspid regurgitant velocity is 2.71  m/s, and with an assumed right atrial pressure of 15 mmHg, the estimated right ventricular systolic pressure is 44.4 mmHg. Left Atrium: Left atrial size was mildly dilated. Right Atrium: Right atrial size was normal in size. Pericardium: There is no evidence of pericardial effusion. Mitral Valve: The mitral valve is normal in structure. Moderate mitral annular  calcification. Mild mitral valve regurgitation. No evidence of mitral valve stenosis. MV peak gradient, 13.6 mmHg. The mean mitral valve gradient is 4.0 mmHg. Tricuspid Valve: The tricuspid valve is normal in structure. Tricuspid valve regurgitation is not demonstrated. No evidence of tricuspid stenosis. Aortic Valve: The aortic valve is normal in structure. There is mild calcification of the aortic valve. Aortic valve regurgitation is not visualized. Aortic valve sclerosis is present, with no evidence of aortic valve stenosis. Aortic valve mean gradient  measures 3.3 mmHg. Aortic valve peak gradient measures 6.0 mmHg. Aortic valve area, by VTI measures 5.02 cm. Pulmonic Valve: The pulmonic valve was normal in structure. Pulmonic valve regurgitation is not visualized. No evidence of pulmonic stenosis. Aorta: The aortic root is normal in size and structure. Venous: The inferior vena cava is dilated in size with less than 50% respiratory variability, suggesting right atrial pressure of 15 mmHg. IAS/Shunts: No atrial level shunt detected by color flow Doppler.  LEFT VENTRICLE PLAX 2D LVIDd:         4.50 cm     Diastology LVIDs:         3.10 cm     LV e' medial:    13.67 cm/s LV PW:         1.00 cm     LV E/e' medial:  9.6 LV IVS:        1.20 cm     LV e' lateral:   7.82 cm/s LVOT diam:     2.50 cm     LV E/e' lateral: 16.8 LV SV:         115 LV SV Index:   56 LVOT Area:     4.91 cm  LV Volumes (MOD) LV vol  d, MOD A2C: 74.9 ml LV vol d, MOD A4C: 60.4 ml LV vol s, MOD A2C: 20.2 ml LV vol s, MOD A4C: 19.3 ml LV SV MOD A2C:     54.7 ml LV SV MOD A4C:     60.4 ml LV SV MOD BP:      47.1 ml RIGHT VENTRICLE             IVC RV Basal diam:  3.30 cm     IVC diam: 2.80 cm RV S prime:     12.63 cm/s TAPSE (M-mode): 1.4 cm LEFT ATRIUM             Index        RIGHT ATRIUM           Index LA diam:        4.30 cm 2.09 cm/m   RA Area:     22.80 cm LA Vol (A2C):   83.7 ml 40.67 ml/m  RA Volume:   58.40 ml  28.38 ml/m LA Vol  (A4C):   81.9 ml 39.80 ml/m LA Biplane Vol: 83.4 ml 40.53 ml/m  AORTIC VALVE AV Area (Vmax):    4.47 cm AV Area (Vmean):   4.54 cm AV Area (VTI):     5.02 cm AV Vmax:           122.33 cm/s AV Vmean:          86.500 cm/s AV VTI:            0.228 m AV Peak Grad:      6.0 mmHg AV Mean Grad:      3.3 mmHg LVOT Vmax:         111.50 cm/s LVOT Vmean:        80.075 cm/s LVOT VTI:          0.233 m LVOT/AV VTI ratio: 1.02  AORTA Ao Root diam: 3.90 cm Ao Asc diam:  3.70 cm MITRAL VALVE                TRICUSPID VALVE MV Area (PHT): 3.82 cm     TR Peak grad:   29.4 mmHg MV Area VTI:   2.89 cm     TR Vmax:        271.00 cm/s MV Peak grad:  13.6 mmHg MV Mean grad:  4.0 mmHg     SHUNTS MV Vmax:       1.85 m/s     Systemic VTI:  0.23 m MV Vmean:      87.6 cm/s    Systemic Diam: 2.50 cm MV Decel Time: 198 msec MV E velocity: 131.00 cm/s Donato Schultz MD Electronically signed by Donato Schultz MD Signature Date/Time: 09/14/2022/3:35:05 PM    Final    MR BRAIN WO CONTRAST  Result Date: 09/14/2022 CLINICAL DATA:  Left MCA stroke.  Status post left M2 thrombectomy. EXAM: MRI HEAD WITHOUT CONTRAST TECHNIQUE: Multiplanar, multiecho pulse sequences of the brain and surrounding structures were obtained without intravenous contrast. COMPARISON:  CTA head neck 09/13/2022 FINDINGS: Brain: There are 2 small regions of abnormal diffusion restriction within the left anterior cerebral artery territory. No acute ischemia in the left MCA territory. Punctate focus of acute ischemia in the left cerebellum. No acute or chronic hemorrhage. Normal white matter signal, parenchymal volume and CSF spaces. The midline structures are normal. Vascular: Major flow voids are preserved. Skull and upper cervical spine: Normal calvarium and skull base. Visualized upper cervical spine and soft tissues are normal.  Sinuses/Orbits:No paranasal sinus fluid levels or advanced mucosal thickening. No mastoid or middle ear effusion. Normal orbits. IMPRESSION: 1. Two  small regions of acute ischemia within the left anterior cerebral artery territory. No hemorrhage or mass effect. 2. Punctate focus of acute ischemia in the left cerebellum. 3. No left MCA territory acute ischemia. Electronically Signed   By: Deatra Robinson M.D.   On: 09/14/2022 01:57   CT ANGIO HEAD NECK W WO CM W PERF (CODE STROKE)  Result Date: 09/13/2022 CLINICAL DATA:  Right-sided weakness EXAM: CT ANGIOGRAPHY HEAD AND NECK CT PERFUSION BRAIN TECHNIQUE: Multidetector CT imaging of the head and neck was performed using the standard protocol during bolus administration of intravenous contrast. Multiplanar CT image reconstructions and MIPs were obtained to evaluate the vascular anatomy. Carotid stenosis measurements (when applicable) are obtained utilizing NASCET criteria, using the distal internal carotid diameter as the denominator. Multiphase CT imaging of the brain was performed following IV bolus contrast injection. Subsequent parametric perfusion maps were calculated using RAPID software. RADIATION DOSE REDUCTION: This exam was performed according to the departmental dose-optimization program which includes automated exposure control, adjustment of the mA and/or kV according to patient size and/or use of iterative reconstruction technique. CONTRAST:  OMNIPAQUE IOHEXOL 350 MG/ML SOLN COMPARISON:  09/13/2022 at 7:03 p.m. FINDINGS: CTA NECK FINDINGS SKELETON: There is no bony spinal canal stenosis. No lytic or blastic lesion. OTHER NECK: Normal pharynx, larynx and major salivary glands. No cervical lymphadenopathy. Unremarkable thyroid gland. UPPER CHEST: No pneumothorax or pleural effusion. No nodules or masses. AORTIC ARCH: There is no calcific atherosclerosis of the aortic arch. There is no aneurysm, dissection or hemodynamically significant stenosis of the visualized portion of the aorta. Conventional 3 vessel aortic branching pattern. The visualized proximal subclavian arteries are widely patent.  RIGHT CAROTID SYSTEM: Normal without aneurysm, dissection or stenosis. LEFT CAROTID SYSTEM: Normal without aneurysm, dissection or stenosis. VERTEBRAL ARTERIES: Left dominant configuration. Both origins are clearly patent. There is no dissection, occlusion or flow-limiting stenosis to the skull base (V1-V3 segments). CTA HEAD FINDINGS POSTERIOR CIRCULATION: --Vertebral arteries: Normal V4 segments. --Inferior cerebellar arteries: Normal. --Basilar artery: Normal. --Superior cerebellar arteries: Normal. --Posterior cerebral arteries (PCA): Normal. ANTERIOR CIRCULATION: --Intracranial internal carotid arteries: Atherosclerotic calcification of the internal carotid arteries at the skull base without hemodynamically significant stenosis. --Anterior cerebral arteries (ACA): Normal. Both A1 segments are present. Patent anterior communicating artery (a-comm). --Middle cerebral arteries (MCA): Unchanged near occlusive stenosis of the left MCA proximal M2 segment. Normal right MCA. VENOUS SINUSES: As permitted by contrast timing, patent. ANATOMIC VARIANTS: Fetal origin of the right posterior cerebral artery. Patent left P-comm. Review of the MIP images confirms the above findings. CT Brain Perfusion Findings: ASPECTS: 10 CBF (<30%) Volume: 12mL Perfusion (Tmax>6.0s) volume: 47mL Mismatch Volume: 35mL Infarction Location:Posterior left MCA territory. IMPRESSION: 1. Unchanged near occlusive stenosis of the left MCA proximal M2 segment. 2. A 35 mL area of ischemic penumbra in the posterior left MCA territory. These results were called by telephone at the time of interpretation on 09/13/2022 at 7:41 pm to provider [facility] , who verbally acknowledged these results. Electronically Signed   By: Deatra Robinson M.D.   On: 09/13/2022 19:42   CT ANGIO HEAD NECK W WO CM (CODE STROKE)  Result Date: 09/13/2022 CLINICAL DATA:  Provided history: Neuro deficit, acute, stroke suspected. Left MCA syndrome. Right-sided flaccid, aphasic.  EXAM: CT ANGIOGRAPHY HEAD AND NECK WITH AND WITHOUT CONTRAST TECHNIQUE: Multidetector CT imaging of the head and neck  was performed using the standard protocol during bolus administration of intravenous contrast. Multiplanar CT image reconstructions and MIPs were obtained to evaluate the vascular anatomy. Carotid stenosis measurements (when applicable) are obtained utilizing NASCET criteria, using the distal internal carotid diameter as the denominator. RADIATION DOSE REDUCTION: This exam was performed according to the departmental dose-optimization program which includes automated exposure control, adjustment of the mA and/or kV according to patient size and/or use of iterative reconstruction technique. CONTRAST:  75mL OMNIPAQUE IOHEXOL 350 MG/ML SOLN COMPARISON:  Noncontrast head CT performed earlier today 09/13/2022. FINDINGS: CTA NECK FINDINGS Aortic arch: Atherosclerotic plaque within the visualized proximal major branch vessels of the neck. The origins of the innominate, left common carotid and left subclavian arteries are excluded from the field of view. No hemodynamically significant innominate or proximal subclavian artery stenosis at the imaged levels. Right carotid system: CCA and ICA patent within the neck without stenosis. Atherosclerotic plaque about the carotid bifurcation and within the proximal ICA. Left carotid system: The visualized common carotid and internal carotid arteries are patent within the neck without stenosis. Atherosclerotic plaque about the carotid bifurcation and within the proximal ICA. Vertebral arteries: The vertebral arteries patent within the neck. Atherosclerotic plaque at the bilateral vertebral artery origins with up to moderate stenosis. Skeleton: Spondylosis of the cervical and visualized upper thoracic levels. Slight C3-C4 grade 1 retrolisthesis. Other neck: No neck mass or cervical lymphadenopathy. Upper chest: No consolidation within the imaged lung apices. Review of the  MIP images confirms the above findings CTA HEAD FINDINGS Anterior circulation: The intracranial internal carotid arteries are patent. Non-stenotic atherosclerotic plaque within both vessels. The M1 middle cerebral arteries are patent. Thrombus within a proximal M2 left MCA vessel with resultant occlusion or near occlusive stenosis (for instance as seen on series 11, image 20). No right M2 proximal branch occlusion or high-grade proximal stenosis identified. The anterior cerebral arteries are patent. No intracranial aneurysm is identified. Posterior circulation: The intracranial vertebral arteries are patent. The basilar artery is patent. The posterior cerebral arteries are patent. Atherosclerotic irregularity of both vessels without high-grade proximal stenosis. The right PCA is fetal in origin. A left posterior communicating artery is present. Venous sinuses: Evaluation for dural venous sinus thrombosis is limited due to contrast timing. Anatomic variants: As described Review of the MIP images confirms the above findings CTA head impression #1 called by telephone at the time of interpretation on 09/13/2022 at 7:04 pm to provider [facility] , who verbally acknowledged these results. IMPRESSION: CTA neck: 1. The origins of the innominate, left common carotid and left subclavian arteries are excluded from the field of view. 2. The visualized common carotid and internal carotid arteries are patent within the neck without stenosis. Atherosclerotic plaque bilaterally, as described. 3. The vertebral arteries are patent within the neck. Atherosclerotic plaque at both vertebral artery origins with up to moderate stenosis. CTA head: 1. Thrombus within a proximal M2 left middle cerebral artery vessel with resultant occlusion or near occlusive stenosis. 2. Background intracranial atherosclerotic disease, as described. No other proximal branch occlusion or high-grade proximal stenosis is identified. Electronically Signed    By: Jackey Loge D.O.   On: 09/13/2022 19:27   CT HEAD CODE STROKE WO CONTRAST  Result Date: 09/13/2022 CLINICAL DATA:  Code stroke. Neuro deficit, acute, stroke suspected. Right-sided weakness. EXAM: CT HEAD WITHOUT CONTRAST TECHNIQUE: Contiguous axial images were obtained from the base of the skull through the vertex without intravenous contrast. RADIATION DOSE REDUCTION: This exam was performed  according to the departmental dose-optimization program which includes automated exposure control, adjustment of the mA and/or kV according to patient size and/or use of iterative reconstruction technique. COMPARISON:  Head CT 02/15/2017. FINDINGS: Brain: Generalized cerebral atrophy. Commensurate prominence of the ventricles and sulci. There is no acute intracranial hemorrhage. No demarcated cortical infarct. No extra-axial fluid collection. No evidence of an intracranial mass. No midline shift. Vascular: No hyperdense vessel.  Atherosclerotic calcifications. Skull: No fracture or aggressive osseous lesion. Sinuses/Orbits: No mass or acute finding within the imaged orbits. Mild mucosal thickening within the right maxillary sinus. Small mucous retention cyst within the left maxillary sinus. Mild mucosal thickening within the bilateral ethmoid sinuses. ASPECTS [facility] Stroke Program Early CT Score) - Ganglionic level infarction (caudate, lentiform nuclei, internal capsule, insula, M1-M3 cortex): 7 - Supraganglionic infarction (M4-M6 cortex): 3 Total score (0-10 with 10 being normal): 10 No evidence of an acute intracranial abnormality. These results were called by telephone at the time of interpretation on 09/13/2022 at 7:04 pm to provider Dr. Selina Cooley, Who verbally acknowledged these results. IMPRESSION: 1. No evidence of an acute intracranial abnormality. ASPECTS is 10. 2. Generalized cerebral atrophy. 3. Paranasal sinus disease as described. Electronically Signed   By: Jackey Loge D.O.   On: 09/13/2022 19:09     Patient Profile     82 y.o. male with AF and stroke s/p thrombectomy  Assessment & Plan    New onset of atrial fibrillation in the setting of stroke - he takes metoprolol 50 mg PO BID - metoprolol has been restarted at a lower dose without issues; at DC will likely return to 450 mg OP BID - complicated by R Pseudoaneurysm and blood loss anemia; when able from IR and neuro standpoint would start eliquis 5 mg PO BID - alcohol cessation discussed, likely a contributor to chronic hyponatremia  Dynamic LVOT gradient  - related to hyperdynamic function, would benefit from outpatient imaging follow up  Recent stroke - LDL at goal     For questions or updates, please contact [facility] Please consult [email or web address] for contact info under Cardiology/STEMI.      Riley Lam, MD FASE [facility] Cardiologist [facility]  [address] [place], [address] [place], [place] [address] [phone]  9:01 AM

## 2022-09-15 NOTE — Progress Notes (Signed)
? ?  Inpatient Rehab Admissions Coordinator : ? ?Per therapy recommendations, patient was screened for CIR candidacy by Nalee Lightle RN MSN.  At this time patient appears to be a potential candidate for CIR. I will place a rehab consult per protocol for full assessment. Please call me with any questions. ? ?Saahas Hidrogo RN MSN ?Admissions Coordinator ?[phone] ?  ?

## 2022-09-15 NOTE — Progress Notes (Signed)
Physical Therapy Treatment Patient Details Name: [name] MRN: [id number] DOB: 1941-01-28 Today's Date: 09/15/2022   History of Present Illness Patient is a 82 yo male presenting to the ED with R arm flaccid, R sided facial droop and expressive aphasia on 09/13/22. 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 5/22 bleeding rt groin from site of sheath removal; 5/23 cleared for OOB to bathroom only  PMH includes: HTN, ascending aortic aneurysm, EtOH abuse    PT Comments    Patient's rt groin site assessed throughout session with no complications noted. RN recommended not walking more than to the bathroom, therefore short distance. Pt was very weak, shaky and required bil UE support. He was previously highly independent and hopes to return home with PRN assistance. Recommend continued rehab at 3+ hours of therapy per day when discharged from acute care.     Recommendations for follow up therapy are one component of a multi-disciplinary discharge planning process, led by the attending physician.  Recommendations may be updated based on patient status, additional functional criteria and insurance authorization.  Follow Up Recommendations       Assistance Recommended at Discharge PRN  Patient can return home with the following A little help with walking and/or transfers;A little help with bathing/dressing/bathroom   Equipment Recommendations  Rolling walker (2 wheels)    Recommendations for Other Services       Precautions / Restrictions Precautions Precautions: Fall Restrictions Weight Bearing Restrictions: No     Mobility  Bed Mobility Overal bed mobility: Needs Assistance Bed Mobility: Supine to Sit     Supine to sit: Min guard     General bed mobility comments: min gaurd to sit EOB    Transfers Overall transfer level: Needs assistance Equipment used: 2 person hand held assist Transfers: Sit to/from Stand Sit to Stand: Min assist, +2  physical assistance, +2 safety/equipment, From elevated surface           General transfer comment: pt reaching for UE support as coming to stand; shaky    Ambulation/Gait Ambulation/Gait assistance: Min assist, +2 physical assistance, +2 safety/equipment Gait Distance (Feet): 12 Feet Assistive device: 2 person hand held assist Gait Pattern/deviations: Step-through pattern, Decreased stride length, Wide base of support   Gait velocity interpretation: <1.31 ft/sec, indicative of household ambulator   General Gait Details: generally shaky and needed bil UE support; wide BOS but no overt LOB   Stairs             Wheelchair Mobility    Modified Rankin (Stroke Patients Only) Modified Rankin (Stroke Patients Only) Pre-Morbid Rankin Score: No symptoms Modified Rankin: Moderately severe disability     Balance Overall balance assessment: Needs assistance   Sitting balance-Leahy Scale: Fair     Standing balance support: Bilateral upper extremity supported Standing balance-Leahy Scale: Poor                              Cognition Arousal/Alertness: Awake/alert Behavior During Therapy: WFL for tasks assessed/performed Overall Cognitive Status: Within Functional Limits for tasks assessed                                 General Comments: oriented x 4        Exercises      General Comments General comments (skin integrity, edema, etc.): Rt groin assessed throughout session with no complications noted.  Pertinent Vitals/Pain Pain Assessment Pain Assessment: No/denies pain    Home Living                          Prior Function            PT Goals (current goals can now be found in the care plan section) Acute Rehab PT Goals Patient Stated Goal: return home Time For Goal Achievement: 09/28/22 Potential to Achieve Goals: Good Progress towards PT goals: Progressing toward goals    Frequency    Min 4X/week       PT Plan Discharge plan needs to be updated    Co-evaluation PT/OT/SLP Co-Evaluation/Treatment: Yes Reason for Co-Treatment: To address functional/ADL transfers PT goals addressed during session: Mobility/safety with mobility        AM-PAC PT "6 Clicks" Mobility   Outcome Measure  Help needed turning from your back to your side while in a flat bed without using bedrails?: A Little Help needed moving from lying on your back to sitting on the side of a flat bed without using bedrails?: A Little Help needed moving to and from a bed to a chair (including a wheelchair)?: Total Help needed standing up from a chair using your arms (e.g., wheelchair or bedside chair)?: Total Help needed to walk in hospital room?: Total Help needed climbing 3-5 steps with a railing? : Total 6 Click Score: 10    End of Session   Activity Tolerance: Patient tolerated treatment well Patient left: in chair;with call bell/phone within reach;with chair alarm set Nurse Communication: Mobility status PT Visit Diagnosis: Muscle weakness (generalized) (M62.81);Difficulty in walking, not elsewhere classified (R26.2)     Time: 1610-9604 PT Time Calculation (min) (ACUTE ONLY): 23 min  Charges:  $Gait Training: 8-22 mins                      Jerolyn Center, PT Acute Rehabilitation Services  Office [phone]    Zena Amos 09/15/2022, 1:16 PM

## 2022-09-15 NOTE — TOC CM/SW Note (Signed)
Transition of Care [facility]) - Inpatient Brief Assessment   Patient Details  Name: [name] MRN: [id number] Date of Birth: 1940-09-28  Transition of Care [facility]) CM/SW Contact:    Mearl Latin, LCSW Phone Number: 09/15/2022, 12:21 PM   Clinical Narrative: TOC following for transition of care needs. Medical workup underway.    Transition of Care Asessment: Insurance and Status: Insurance coverage has been reviewed Patient has primary care physician: Yes Home environment has been reviewed: Pt from home alone Prior level of function:: Independent Prior/Current Home Services: No current home services Social Determinants of Health Reivew: SDOH reviewed no interventions necessary Readmission risk has been reviewed: Yes Transition of care needs: transition of care needs identified, TOC will continue to follow

## 2022-09-15 NOTE — Evaluation (Signed)
Speech Language Pathology Evaluation Patient Details Name: [name] MRN: [id number] DOB: [birth year] Today's Date: 09/15/2022 Time:  -     Problem List:  Patient Active Problem List   Diagnosis Date Noted   Acute ischemic left MCA stroke (HCC) 09/13/2022   Middle cerebral artery embolism, left 09/13/2022   Acute encephalopathy 01/27/2017   Chronic hyponatremia 01/27/2017   AKI (acute kidney injury) (HCC) 01/27/2017   Alcoholism (HCC) 01/27/2017   Ascending aortic aneurysm (HCC) 11/29/2016   Bradycardia 11/29/2016   Moderate mitral insufficiency 11/29/2016   Edema of both legs    Hypertension    Personal history of gout    History of palpitations    Abnormal EKG 11/01/2016   Benign essential hypertension 10/10/2016   Past Medical History:  Past Medical History:  Diagnosis Date   Abnormal EKG    Alcoholism (HCC)    Ascending aortic aneurysm (HCC)    Edema of both legs    Gout    History of palpitations    Hypertension    Personal history of gout    Past Surgical History:  Past Surgical History:  Procedure Laterality Date   incision of left neck     RADIOLOGY WITH ANESTHESIA N/A 09/13/2022   Procedure: IR WITH ANESTHESIA;  Surgeon: Julieanne Cotton, MD;  Location: [facility] OR;  Service: Radiology;  Laterality: N/A;   HPI:  82 yo man with hx HTN, ascending aortic aneurysm, EtOH abuse per chart review who was BIB EMS with R hemiplegia and aphasia (NIHSS 23) and was found to be having a 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 Etiology is embolic in setting of undiagnosed a fib.  After scan was completed patient's R sided weakness and aphasia acutely improved signifcantly, NIHSS 23 --> 4. Repeat CTA showed unchanged L proximal M2 near-occlusion. CTP showed 47 ml penumbra and 12 ml core in L MCA inferior division territory. s/p S/P Left common , carotid arteriogram 5/21.   Assessment / Plan / Recommendation Clinical Impression  Pt demosntrates language  and speech WNL. His cognition is WNL, but pt does not have good awareness of needs yet. He is in the process of understanding his physical capabilities. Pt initially assumed he would be capable of immediate independence at home, but questions about his mobility helped him consider different potential outcomes. Pt reasoning with guidance and suggestions about discharge options was appropriate. No further SLP needs at this time.    SLP Assessment  SLP Recommendation/Assessment: Patient does not need any further Speech Lanaguage Pathology Services SLP Visit Diagnosis: Cognitive communication deficit (R41.841)    Recommendations for follow up therapy are one component of a multi-disciplinary discharge planning process, led by the attending physician.  Recommendations may be updated based on patient status, additional functional criteria and insurance authorization.                   SLP Evaluation Cognition  Overall Cognitive Status:  (WFL but needed better information for judgement) Orientation Level: Oriented X4 Safety/Judgment: Other (comment) (developing awareness and judgement)       Comprehension  Auditory Comprehension Overall Auditory Comprehension: Appears within functional limits for tasks assessed    Expression Verbal Expression Overall Verbal Expression: Appears within functional limits for tasks assessed   Oral / Motor  Motor Speech Overall Motor Speech: Appears within functional limits for tasks assessed            Zaydan Papesh, Riley Nearing 09/15/2022, 1:26 PM

## 2022-09-15 NOTE — Progress Notes (Signed)
Occupational Therapy Treatment Patient Details Name: [name] MRN: [id number] DOB: Jun 16, 1940 Today's Date: 09/15/2022   History of present illness Patient is a 82 yo male presenting to the ED with R arm flaccid, R sided facial droop and expressive aphasia on 09/13/22. 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 5/22 bleeding rt groin from site of sheath removal; 5/23 cleared for OOB to bathroom only  PMH includes: HTN, ascending aortic aneurysm, EtOH abuse   OT comments  Patient progressing towards OT goals.  Eager to get OOB and mobilize today.   Completing bed mobility with min guard assist, transfers with min assist +2 and ADLS with setup to total assist.  Pt with soreness in R groin, but assessed throughout session with no issues. Believe he would benefit from intensive inpatient level rehab with >3 hrs/ day to progress higher level cognition, safety, balance and activity tolerance.  Will follow acutely.    Recommendations for follow up therapy are one component of a multi-disciplinary discharge planning process, led by the attending physician.  Recommendations may be updated based on patient status, additional functional criteria and insurance authorization.    Assistance Recommended at Discharge Frequent or constant Supervision/Assistance  Patient can return home with the following  A lot of help with walking and/or transfers;A lot of help with bathing/dressing/bathroom;Assistance with cooking/housework;Assist for transportation;Help with stairs or ramp for entrance;Direct supervision/assist for financial management;Direct supervision/assist for medications management   Equipment Recommendations  Other (comment) (TBD)    Recommendations for Other Services Rehab consult    Precautions / Restrictions Precautions Precautions: Fall Restrictions Weight Bearing Restrictions: No       Mobility Bed Mobility Overal bed mobility: Needs Assistance Bed  Mobility: Supine to Sit     Supine to sit: Min guard     General bed mobility comments: min guard to sit EOB    Transfers Overall transfer level: Needs assistance Equipment used: 2 person hand held assist Transfers: Sit to/from Stand Sit to Stand: Min assist, +2 physical assistance, +2 safety/equipment, From elevated surface           General transfer comment: pt reaching for UE support as coming to stand; shaky     Balance Overall balance assessment: Needs assistance   Sitting balance-Leahy Scale: Fair     Standing balance support: Bilateral upper extremity supported Standing balance-Leahy Scale: Poor Standing balance comment: relies on BUE support                           ADL either performed or assessed with clinical judgement   ADL Overall ADL's : Needs assistance/impaired     Grooming: Set up;Sitting               Lower Body Dressing: Maximal assistance;+2 for physical assistance;+2 for safety/equipment;Sit to/from stand Lower Body Dressing Details (indicate cue type and reason): require assist for socks, min assist +2 standing Toilet Transfer: Minimal assistance;+2 for physical assistance;+2 for safety/equipment;Ambulation           Functional mobility during ADLs: Minimal assistance;+2 for physical assistance;+2 for safety/equipment      Extremity/Trunk Assessment              Vision       Perception     Praxis      Cognition Arousal/Alertness: Awake/alert Behavior During Therapy: WFL for tasks assessed/performed Overall Cognitive Status: Impaired/Different from baseline Area of Impairment: Problem solving  Problem Solving: Slow processing, Requires verbal cues General Comments: oriented x 4, following commands and deomnstrating fair awareness to deficits.  needs further assessment with higher level cognition.  SBT WFL.        Exercises      Shoulder Instructions        General Comments Rt groin assessed throughout session with no complications noted.    Pertinent Vitals/ Pain       Pain Assessment Pain Assessment: No/denies pain  Home Living                                          Prior Functioning/Environment              Frequency  Min 2X/week        Progress Toward Goals  OT Goals(current goals can now be found in the care plan section)  Progress towards OT goals: Progressing toward goals  Acute Rehab OT Goals Patient Stated Goal: get better OT Goal Formulation: With patient Time For Goal Achievement: 09/28/22 Potential to Achieve Goals: Good  Plan Frequency remains appropriate;Discharge plan needs to be updated    Co-evaluation    PT/OT/SLP Co-Evaluation/Treatment: Yes Reason for Co-Treatment: To address functional/ADL transfers;For patient/therapist safety PT goals addressed during session: Mobility/safety with mobility OT goals addressed during session: ADL's and self-care      AM-PAC OT "6 Clicks" Daily Activity     Outcome Measure   Help from another person eating meals?: None Help from another person taking care of personal grooming?: A Little Help from another person toileting, which includes using toliet, bedpan, or urinal?: A Little Help from another person bathing (including washing, rinsing, drying)?: A Lot Help from another person to put on and taking off regular upper body clothing?: None Help from another person to put on and taking off regular lower body clothing?: Total 6 Click Score: 17    End of Session    OT Visit Diagnosis: Other symptoms and signs involving cognitive function;Muscle weakness (generalized) (M62.81)   Activity Tolerance Patient tolerated treatment well   Patient Left in chair;with call bell/phone within reach;with chair alarm set   Nurse Communication Mobility status        Time: 1610-9604 OT Time Calculation (min): 23 min  Charges: OT General  Charges $OT Visit: 1 Visit OT Treatments $Self Care/Home Management : 8-22 mins  Barry Brunner, OT Acute Rehabilitation Services Office [phone]   Chancy Milroy 09/15/2022, 2:03 PM

## 2022-09-15 NOTE — Progress Notes (Signed)
Referring Physician(s): Delia Heady, MD/Code stroke  Supervising Physician: Julieanne Cotton  Patient Status:  [facility] - In-pt  Chief Complaint: Follow up left MCA thrombectomy 09/13/22 in NIR  Subjective:  Patient sitting up in bed, had been laying flat until about an hour ago. No further bleeding noted from the right groin puncture site. Remains on cleviprex. No complaints voiced.  Allergies: Patient has no known allergies.  Medications: Prior to Admission medications   Medication Sig Start Date End Date Taking? Authorizing Provider  allopurinol (ZYLOPRIM) 300 MG tablet Take 1 tablet (300 mg total) by mouth daily. 01/27/18   Lyndon Code, MD  aspirin EC 81 MG tablet Take 81 mg by mouth daily.    [provider]  chlorthalidone (HYGROTON) 25 MG tablet Take 25 mg by mouth daily.  11/29/16 11/29/17  [provider]  colchicine 0.6 MG tablet Take 0.6 mg by mouth 2 (two) times daily as needed (gout flares).     [provider]  cyanocobalamin 500 MCG tablet Take 500 mcg by mouth daily. Vitamin B12    [provider]  diphenhydrAMINE (BENADRYL) 25 MG tablet Take 25 mg by mouth at bedtime as needed for sleep.    [provider]  folic acid (FOLVITE) 1 MG tablet Take 1 tablet (1 mg total) by mouth daily. 01/29/17   Mikhail, Nita Sells, DO  furosemide (LASIX) 20 MG tablet Take 1 tablet (20 mg total) by mouth daily. 01/27/18   Lyndon Code, MD  Ginseng 100 MG CAPS Take 100 mg by mouth daily.    [provider]  ketoconazole (NIZORAL) 2 % cream Apply 1 application topically 2 (two) times daily as needed (rash).     [provider]  lisinopril (PRINIVIL,ZESTRIL) 30 MG tablet Take 1 tablet (30 mg total) by mouth daily. 01/27/18   Lyndon Code, MD  metoprolol tartrate (LOPRESSOR) 50 MG tablet Take 25 mg by mouth daily.  06/27/16   [provider]  Multiple Vitamin (MULTIVITAMIN WITH MINERALS) TABS tablet Take 1 tablet by mouth  daily. 01/29/17   Mikhail, Nita Sells, DO  naproxen (NAPROSYN) 500 MG tablet Take 500 mg by mouth 2 (two) times daily as needed (pain).     [provider]  OVER THE COUNTER MEDICATION Take 1 tablet by mouth daily. Super Glucosamine 2000 plus - glucosamine, MSM, Boron, Silica and Hyaluronic Acid, Vitamin C, Manganese, Sodium    [provider]  sildenafil (VIAGRA) 100 MG tablet Take 0.5-1 tablets (50-100 mg total) by mouth daily as needed for erectile dysfunction. 01/27/18   Lyndon Code, MD  thiamine 100 MG tablet Take 1 tablet (100 mg total) by mouth daily. 01/29/17   Mikhail, Nita Sells, DO  tiZANidine (ZANAFLEX) 4 MG tablet Take 1 tablet (4 mg total) by mouth daily as needed for muscle spasms. 01/27/18   Lyndon Code, MD     Vital Signs: BP 130/83   Pulse 96   Temp 98.3 F (36.8 C) (Oral)   Resp (!) 25   Ht 6' (1.829 m)   Wt 184 lb 4.9 oz (83.6 kg)   SpO2 94%   BMI 25.00 kg/m   Physical Exam Vitals and nursing note reviewed.  Constitutional:      General: He is not in acute distress. HENT:     Head: Normocephalic.  Cardiovascular:     Rate and Rhythm: Normal rate.     Comments: (+) Right CFA puncture site without active bleeding, clean, dry, dressed  appropriately. Mildly TTP, non-pulsatile.  Pulmonary:     Effort: Pulmonary effort is normal.  Skin:    General: Skin is warm and dry.  Neurological:     Mental Status: He is alert. Mental status is at baseline.     Imaging: VAS [place] GROIN PSEUDOANEURYSM  Result Date: 09/14/2022  ARTERIAL PSEUDOANEURYSM  Patient Name:  FRASER VLAHOS  Date of Exam:   09/14/2022 Medical Rec #: [id number]         Accession #:    [id number] Date of Birth: 1940-11-18         Patient Gender: M Patient Age:   82 years Exam Location:  [facility] Procedure:      VAS [place] Bobetta Lime Referring Phys: Julieanne Cotton --------------------------------------------------------------------------------  Exam: Right groin Indications:  Patient complains of bleeding post IR procedure. History: S/P Left MCA mechanical thrombectomy. Limitations: Post procedure pressure dressing Comparison Study: No previous exams Performing Technologist: Jody Hill RVT, RDMS  Examination Guidelines: A complete evaluation includes B-mode imaging, spectral Doppler, color Doppler, and power Doppler as needed of all accessible portions of each vessel. Bilateral testing is considered an integral part of a complete examination. Limited examinations for reoccurring indications may be performed as noted. +------------+----------+---------+------+----------+ Right DuplexPSV (cm/s)Waveform PlaqueComment(s) +------------+----------+---------+------+----------+ CFA            143    biphasic                  +------------+----------+---------+------+----------+ PFA             66    biphasic                  +------------+----------+---------+------+----------+ Prox SFA        83    triphasic                 +------------+----------+---------+------+----------+ Right Vein comments:CFV patent with phasic flow.  Summary: No evidence of pseudoaneurysm, AVF or DVT  Diagnosing physician: Gerarda Fraction Electronically signed by Gerarda Fraction on 09/14/2022 at 5:38:17 PM.    --------------------------------------------------------------------------------    Final    ECHOCARDIOGRAM COMPLETE  Result Date: 09/14/2022    ECHOCARDIOGRAM REPORT   Patient Name:   MADAN HANNEL Date of Exam: 09/14/2022 Medical Rec #:  [id number]        Height:       72.0 in Accession #:    [id number]       Weight:       184.3 lb Date of Birth:  [birth year]        BSA:          2.058 m Patient Age:    81 years         BP:           125/57 mmHg Patient Gender: M                HR:           74 bpm. Exam Location:  Inpatient Procedure: 2D Echo, Cardiac Doppler and Color Doppler Indications:    Stroke  History:        Patient has prior history of Echocardiogram examinations, most                  recent 02/25/2021. Stroke and AKI, Mitral Valve Disease,                 Arrythmias:Bradycardia, Signs/Symptoms:Edema; Risk  Factors:Hypertension and alcohol abuse.  Sonographer:    Wallie Char Referring Phys: [id number] Malachi Carl STACK IMPRESSIONS  1. Peak outflow velocity 3.3 m/s, peak gradient 45 mmHg. Left ventricular ejection fraction, by estimation, is >75%. The left ventricle has hyperdynamic function. The left ventricle has no regional wall motion abnormalities. There is mild left ventricular hypertrophy. Left ventricular diastolic parameters are indeterminate.  2. Right ventricular systolic function is normal. The right ventricular size is normal. There is mildly elevated pulmonary artery systolic pressure. The estimated right ventricular systolic pressure is 44.4 mmHg.  3. Left atrial size was mildly dilated.  4. The mitral valve is normal in structure. Mild mitral valve regurgitation. No evidence of mitral stenosis. Moderate mitral annular calcification.  5. The aortic valve is normal in structure. There is mild calcification of the aortic valve. Aortic valve regurgitation is not visualized. Aortic valve sclerosis is present, with no evidence of aortic valve stenosis. Aortic valve Vmax measures 1.22 m/s.  6. The inferior vena cava is dilated in size with <50% respiratory variability, suggesting right atrial pressure of 15 mmHg. FINDINGS  Left Ventricle: Peak outflow velocity 3.3 m/s, peak gradient 45 mmHg. Left ventricular ejection fraction, by estimation, is >75%. The left ventricle has hyperdynamic function. The left ventricle has no regional wall motion abnormalities. The left ventricular internal cavity size was normal in size. There is mild left ventricular hypertrophy. Left ventricular diastolic parameters are indeterminate. Right Ventricle: The right ventricular size is normal. No increase in right ventricular wall thickness. Right ventricular systolic function is normal. There is  mildly elevated pulmonary artery systolic pressure. The tricuspid regurgitant velocity is 2.71  m/s, and with an assumed right atrial pressure of 15 mmHg, the estimated right ventricular systolic pressure is 44.4 mmHg. Left Atrium: Left atrial size was mildly dilated. Right Atrium: Right atrial size was normal in size. Pericardium: There is no evidence of pericardial effusion. Mitral Valve: The mitral valve is normal in structure. Moderate mitral annular calcification. Mild mitral valve regurgitation. No evidence of mitral valve stenosis. MV peak gradient, 13.6 mmHg. The mean mitral valve gradient is 4.0 mmHg. Tricuspid Valve: The tricuspid valve is normal in structure. Tricuspid valve regurgitation is not demonstrated. No evidence of tricuspid stenosis. Aortic Valve: The aortic valve is normal in structure. There is mild calcification of the aortic valve. Aortic valve regurgitation is not visualized. Aortic valve sclerosis is present, with no evidence of aortic valve stenosis. Aortic valve mean gradient  measures 3.3 mmHg. Aortic valve peak gradient measures 6.0 mmHg. Aortic valve area, by VTI measures 5.02 cm. Pulmonic Valve: The pulmonic valve was normal in structure. Pulmonic valve regurgitation is not visualized. No evidence of pulmonic stenosis. Aorta: The aortic root is normal in size and structure. Venous: The inferior vena cava is dilated in size with less than 50% respiratory variability, suggesting right atrial pressure of 15 mmHg. IAS/Shunts: No atrial level shunt detected by color flow Doppler.  LEFT VENTRICLE PLAX 2D LVIDd:         4.50 cm     Diastology LVIDs:         3.10 cm     LV e' medial:    13.67 cm/s LV PW:         1.00 cm     LV E/e' medial:  9.6 LV IVS:        1.20 cm     LV e' lateral:   7.82 cm/s LVOT diam:     2.50 cm  LV E/e' lateral: 16.8 LV SV:         115 LV SV Index:   56 LVOT Area:     4.91 cm  LV Volumes (MOD) LV vol d, MOD A2C: 74.9 ml LV vol d, MOD A4C: 60.4 ml LV vol s, MOD  A2C: 20.2 ml LV vol s, MOD A4C: 19.3 ml LV SV MOD A2C:     54.7 ml LV SV MOD A4C:     60.4 ml LV SV MOD BP:      47.1 ml RIGHT VENTRICLE             IVC RV Basal diam:  3.30 cm     IVC diam: 2.80 cm RV S prime:     12.63 cm/s TAPSE (M-mode): 1.4 cm LEFT ATRIUM             Index        RIGHT ATRIUM           Index LA diam:        4.30 cm 2.09 cm/m   RA Area:     22.80 cm LA Vol (A2C):   83.7 ml 40.67 ml/m  RA Volume:   58.40 ml  28.38 ml/m LA Vol (A4C):   81.9 ml 39.80 ml/m LA Biplane Vol: 83.4 ml 40.53 ml/m  AORTIC VALVE AV Area (Vmax):    4.47 cm AV Area (Vmean):   4.54 cm AV Area (VTI):     5.02 cm AV Vmax:           122.33 cm/s AV Vmean:          86.500 cm/s AV VTI:            0.228 m AV Peak Grad:      6.0 mmHg AV Mean Grad:      3.3 mmHg LVOT Vmax:         111.50 cm/s LVOT Vmean:        80.075 cm/s LVOT VTI:          0.233 m LVOT/AV VTI ratio: 1.02  AORTA Ao Root diam: 3.90 cm Ao Asc diam:  3.70 cm MITRAL VALVE                TRICUSPID VALVE MV Area (PHT): 3.82 cm     TR Peak grad:   29.4 mmHg MV Area VTI:   2.89 cm     TR Vmax:        271.00 cm/s MV Peak grad:  13.6 mmHg MV Mean grad:  4.0 mmHg     SHUNTS MV Vmax:       1.85 m/s     Systemic VTI:  0.23 m MV Vmean:      87.6 cm/s    Systemic Diam: 2.50 cm MV Decel Time: 198 msec MV E velocity: 131.00 cm/s Donato Schultz MD Electronically signed by Donato Schultz MD Signature Date/Time: 09/14/2022/3:35:05 PM    Final    MR BRAIN WO CONTRAST  Result Date: 09/14/2022 CLINICAL DATA:  Left MCA stroke.  Status post left M2 thrombectomy. EXAM: MRI HEAD WITHOUT CONTRAST TECHNIQUE: Multiplanar, multiecho pulse sequences of the brain and surrounding structures were obtained without intravenous contrast. COMPARISON:  CTA head neck 09/13/2022 FINDINGS: Brain: There are 2 small regions of abnormal diffusion restriction within the left anterior cerebral artery territory. No acute ischemia in the left MCA territory. Punctate focus of acute ischemia in the left  cerebellum. No acute or chronic hemorrhage. Normal white matter  signal, parenchymal volume and CSF spaces. The midline structures are normal. Vascular: Major flow voids are preserved. Skull and upper cervical spine: Normal calvarium and skull base. Visualized upper cervical spine and soft tissues are normal. Sinuses/Orbits:No paranasal sinus fluid levels or advanced mucosal thickening. No mastoid or middle ear effusion. Normal orbits. IMPRESSION: 1. Two small regions of acute ischemia within the left anterior cerebral artery territory. No hemorrhage or mass effect. 2. Punctate focus of acute ischemia in the left cerebellum. 3. No left MCA territory acute ischemia. Electronically Signed   By: Deatra Robinson M.D.   On: 09/14/2022 01:57   CT ANGIO HEAD NECK W WO CM W PERF (CODE STROKE)  Result Date: 09/13/2022 CLINICAL DATA:  Right-sided weakness EXAM: CT ANGIOGRAPHY HEAD AND NECK CT PERFUSION BRAIN TECHNIQUE: Multidetector CT imaging of the head and neck was performed using the standard protocol during bolus administration of intravenous contrast. Multiplanar CT image reconstructions and MIPs were obtained to evaluate the vascular anatomy. Carotid stenosis measurements (when applicable) are obtained utilizing NASCET criteria, using the distal internal carotid diameter as the denominator. Multiphase CT imaging of the brain was performed following IV bolus contrast injection. Subsequent parametric perfusion maps were calculated using RAPID software. RADIATION DOSE REDUCTION: This exam was performed according to the departmental dose-optimization program which includes automated exposure control, adjustment of the mA and/or kV according to patient size and/or use of iterative reconstruction technique. CONTRAST:  OMNIPAQUE IOHEXOL 350 MG/ML SOLN COMPARISON:  09/13/2022 at 7:03 p.m. FINDINGS: CTA NECK FINDINGS SKELETON: There is no bony spinal canal stenosis. No lytic or blastic lesion. OTHER NECK: Normal pharynx,  larynx and major salivary glands. No cervical lymphadenopathy. Unremarkable thyroid gland. UPPER CHEST: No pneumothorax or pleural effusion. No nodules or masses. AORTIC ARCH: There is no calcific atherosclerosis of the aortic arch. There is no aneurysm, dissection or hemodynamically significant stenosis of the visualized portion of the aorta. Conventional 3 vessel aortic branching pattern. The visualized proximal subclavian arteries are widely patent. RIGHT CAROTID SYSTEM: Normal without aneurysm, dissection or stenosis. LEFT CAROTID SYSTEM: Normal without aneurysm, dissection or stenosis. VERTEBRAL ARTERIES: Left dominant configuration. Both origins are clearly patent. There is no dissection, occlusion or flow-limiting stenosis to the skull base (V1-V3 segments). CTA HEAD FINDINGS POSTERIOR CIRCULATION: --Vertebral arteries: Normal V4 segments. --Inferior cerebellar arteries: Normal. --Basilar artery: Normal. --Superior cerebellar arteries: Normal. --Posterior cerebral arteries (PCA): Normal. ANTERIOR CIRCULATION: --Intracranial internal carotid arteries: Atherosclerotic calcification of the internal carotid arteries at the skull base without hemodynamically significant stenosis. --Anterior cerebral arteries (ACA): Normal. Both A1 segments are present. Patent anterior communicating artery (a-comm). --Middle cerebral arteries (MCA): Unchanged near occlusive stenosis of the left MCA proximal M2 segment. Normal right MCA. VENOUS SINUSES: As permitted by contrast timing, patent. ANATOMIC VARIANTS: Fetal origin of the right posterior cerebral artery. Patent left P-comm. Review of the MIP images confirms the above findings. CT Brain Perfusion Findings: ASPECTS: 10 CBF (<30%) Volume: 12mL Perfusion (Tmax>6.0s) volume: 47mL Mismatch Volume: 35mL Infarction Location:Posterior left MCA territory. IMPRESSION: 1. Unchanged near occlusive stenosis of the left MCA proximal M2 segment. 2. A 35 mL area of ischemic penumbra in the  posterior left MCA territory. These results were called by telephone at the time of interpretation on 09/13/2022 at 7:41 pm to provider [facility] , who verbally acknowledged these results. Electronically Signed   By: Deatra Robinson M.D.   On: 09/13/2022 19:42   CT ANGIO HEAD NECK W WO CM (CODE STROKE)  Result Date: 09/13/2022  CLINICAL DATA:  Provided history: Neuro deficit, acute, stroke suspected. Left MCA syndrome. Right-sided flaccid, aphasic. EXAM: CT ANGIOGRAPHY HEAD AND NECK WITH AND WITHOUT CONTRAST TECHNIQUE: Multidetector CT imaging of the head and neck was performed using the standard protocol during bolus administration of intravenous contrast. Multiplanar CT image reconstructions and MIPs were obtained to evaluate the vascular anatomy. Carotid stenosis measurements (when applicable) are obtained utilizing NASCET criteria, using the distal internal carotid diameter as the denominator. RADIATION DOSE REDUCTION: This exam was performed according to the departmental dose-optimization program which includes automated exposure control, adjustment of the mA and/or kV according to patient size and/or use of iterative reconstruction technique. CONTRAST:  75mL OMNIPAQUE IOHEXOL 350 MG/ML SOLN COMPARISON:  Noncontrast head CT performed earlier today 09/13/2022. FINDINGS: CTA NECK FINDINGS Aortic arch: Atherosclerotic plaque within the visualized proximal major branch vessels of the neck. The origins of the innominate, left common carotid and left subclavian arteries are excluded from the field of view. No hemodynamically significant innominate or proximal subclavian artery stenosis at the imaged levels. Right carotid system: CCA and ICA patent within the neck without stenosis. Atherosclerotic plaque about the carotid bifurcation and within the proximal ICA. Left carotid system: The visualized common carotid and internal carotid arteries are patent within the neck without stenosis. Atherosclerotic plaque about  the carotid bifurcation and within the proximal ICA. Vertebral arteries: The vertebral arteries patent within the neck. Atherosclerotic plaque at the bilateral vertebral artery origins with up to moderate stenosis. Skeleton: Spondylosis of the cervical and visualized upper thoracic levels. Slight C3-C4 grade 1 retrolisthesis. Other neck: No neck mass or cervical lymphadenopathy. Upper chest: No consolidation within the imaged lung apices. Review of the MIP images confirms the above findings CTA HEAD FINDINGS Anterior circulation: The intracranial internal carotid arteries are patent. Non-stenotic atherosclerotic plaque within both vessels. The M1 middle cerebral arteries are patent. Thrombus within a proximal M2 left MCA vessel with resultant occlusion or near occlusive stenosis (for instance as seen on series 11, image 20). No right M2 proximal branch occlusion or high-grade proximal stenosis identified. The anterior cerebral arteries are patent. No intracranial aneurysm is identified. Posterior circulation: The intracranial vertebral arteries are patent. The basilar artery is patent. The posterior cerebral arteries are patent. Atherosclerotic irregularity of both vessels without high-grade proximal stenosis. The right PCA is fetal in origin. A left posterior communicating artery is present. Venous sinuses: Evaluation for dural venous sinus thrombosis is limited due to contrast timing. Anatomic variants: As described Review of the MIP images confirms the above findings CTA head impression #1 called by telephone at the time of interpretation on 09/13/2022 at 7:04 pm to provider [facility] , who verbally acknowledged these results. IMPRESSION: CTA neck: 1. The origins of the innominate, left common carotid and left subclavian arteries are excluded from the field of view. 2. The visualized common carotid and internal carotid arteries are patent within the neck without stenosis. Atherosclerotic plaque bilaterally, as  described. 3. The vertebral arteries are patent within the neck. Atherosclerotic plaque at both vertebral artery origins with up to moderate stenosis. CTA head: 1. Thrombus within a proximal M2 left middle cerebral artery vessel with resultant occlusion or near occlusive stenosis. 2. Background intracranial atherosclerotic disease, as described. No other proximal branch occlusion or high-grade proximal stenosis is identified. Electronically Signed   By: Jackey Loge D.O.   On: 09/13/2022 19:27   CT HEAD CODE STROKE WO CONTRAST  Result Date: 09/13/2022 CLINICAL DATA:  Code stroke. Neuro deficit,  acute, stroke suspected. Right-sided weakness. EXAM: CT HEAD WITHOUT CONTRAST TECHNIQUE: Contiguous axial images were obtained from the base of the skull through the vertex without intravenous contrast. RADIATION DOSE REDUCTION: This exam was performed according to the departmental dose-optimization program which includes automated exposure control, adjustment of the mA and/or kV according to patient size and/or use of iterative reconstruction technique. COMPARISON:  Head CT 02/15/2017. FINDINGS: Brain: Generalized cerebral atrophy. Commensurate prominence of the ventricles and sulci. There is no acute intracranial hemorrhage. No demarcated cortical infarct. No extra-axial fluid collection. No evidence of an intracranial mass. No midline shift. Vascular: No hyperdense vessel.  Atherosclerotic calcifications. Skull: No fracture or aggressive osseous lesion. Sinuses/Orbits: No mass or acute finding within the imaged orbits. Mild mucosal thickening within the right maxillary sinus. Small mucous retention cyst within the left maxillary sinus. Mild mucosal thickening within the bilateral ethmoid sinuses. ASPECTS [facility] Stroke Program Early CT Score) - Ganglionic level infarction (caudate, lentiform nuclei, internal capsule, insula, M1-M3 cortex): 7 - Supraganglionic infarction (M4-M6 cortex): 3 Total score (0-10 with 10  being normal): 10 No evidence of an acute intracranial abnormality. These results were called by telephone at the time of interpretation on 09/13/2022 at 7:04 pm to provider Dr. Selina Cooley, Who verbally acknowledged these results. IMPRESSION: 1. No evidence of an acute intracranial abnormality. ASPECTS is 10. 2. Generalized cerebral atrophy. 3. Paranasal sinus disease as described. Electronically Signed   By: Jackey Loge D.O.   On: 09/13/2022 19:09    Labs:  CBC: Recent Labs    09/13/22 1842 09/13/22 1849 09/14/22 0531  WBC 5.3  --  7.1  HGB 12.1* 12.6* 10.4*  HCT 38.5* 37.0* 32.2*  PLT 145*  --  152    COAGS: Recent Labs    09/13/22 1842  INR 1.3*  APTT 33    BMP: Recent Labs    09/13/22 1842 09/13/22 1849 09/14/22 0531  NA 130* 132* 128*  K 4.6 5.2* 3.5  CL 102 103 104  CO2 18*  --  16*  GLUCOSE 105* 98 117*  BUN 28* 37* 22  CALCIUM 8.9  --  8.2*  CREATININE 1.50* 1.40* 1.35*  GFRNONAA 47*  --  53*    LIVER FUNCTION TESTS: Recent Labs    09/13/22 1842  BILITOT 0.7  AST 19  ALT 10  ALKPHOS 82  PROT 6.9  ALBUMIN 3.5    Assessment and Plan:  82 y/o M who presented to the ED 09/13/22 as a code stroke 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 He underwent successful thrombectomy in NIR that same day and has been doing well neurologically. He experienced persistent bleeding from the right CFA puncture site after working with PT/OT yesterday which required QuickClot + manual pressure x 40 minutes before hemostasis was achieved. Knee immobilizer placed, Eliquis held, right groin [place] ordered which showed no evidence of pseudoaneurysm.  Patient sitting up in bed on exam today, has not been out of bed per RN. No further bleeding per RN or patient. No bleeding noted on exam today.   Plan: - OOB in chair with assistance today, needs assistance for transfers to hold pressure on right groin site. May walk short distances while holding pressure at groin site. - Wean Cleviprex - May  restart Eliquis tomorrow from [facility] perspective but will defer ultimate timing to neurology/primary team  No further NIR needs at this time. Please call with questions or concerns.  Electronically Signed: Villa Herb, PA-C 09/15/2022, 8:55 AM  I spent a total of 15 Minutes at the the patient's bedside AND on the patient's hospital floor or unit, greater than 50% of which was counseling/coordinating care for left MCA occlusion.

## 2022-09-15 NOTE — Progress Notes (Signed)
STROKE TEAM PROGRESS NOTE   INTERVAL HISTORY No family is at the bedside this morning on assessment.   Patient had some right groin bruising and discoloration.  Groin ultrasound shows no evidence of pseudoaneurysm.  Patient is presently on bedrest as per neurointerventional team.  Neurological exam is unchanged.  Echocardiogram shows ejection fraction 75% with left atrial dilatation.  Eliquis was on hold last night  Vitals:   09/15/22 1200 09/15/22 1230 09/15/22 1300 09/15/22 1330  BP: 135/72 (!) 143/69 126/60 124/74  Pulse: 86 83 81 79  Resp: 17 18 16 16   Temp:      TempSrc:      SpO2: 98% 96% 95% 96%  Weight:      Height:       CBC:  Recent Labs  Lab 09/13/22 1842 09/13/22 1849 09/14/22 0531  WBC 5.3  --  7.1  NEUTROABS 3.9  --  5.2  HGB 12.1* 12.6* 10.4*  HCT 38.5* 37.0* 32.2*  MCV 101.3*  --  97.9  PLT 145*  --  152   Basic Metabolic Panel:  Recent Labs  Lab 09/13/22 1842 09/13/22 1849 09/14/22 0531  NA 130* 132* 128*  K 4.6 5.2* 3.5  CL 102 103 104  CO2 18*  --  16*  GLUCOSE 105* 98 117*  BUN 28* 37* 22  CREATININE 1.50* 1.40* 1.35*  CALCIUM 8.9  --  8.2*   Lipid Panel:  Recent Labs  Lab 09/14/22 0531  CHOL 119  TRIG 86  HDL 70  CHOLHDL 1.7  VLDL 17  LDLCALC 32   HgbA1c:  Recent Labs  Lab 09/14/22 0531  HGBA1C 4.8   Urine Drug Screen:  Recent Labs  Lab 09/14/22 0531  LABOPIA NONE DETECTED  COCAINSCRNUR NONE DETECTED  LABBENZ NONE DETECTED  AMPHETMU NONE DETECTED  THCU NONE DETECTED  LABBARB NONE DETECTED    Alcohol Level  Recent Labs  Lab 09/13/22 1842  ETH <10   IMAGING past 24 hours No results found.  PHYSICAL EXAM Constitutional: Elderly Caucasian male who appears well-developed and well-nourished.  Psych: Affect appropriate to situation, calm and cooperative with exam Eyes: No scleral injection HENT: No OP obstrucion MSK: no joint deformities or swelling Cardiovascular: Irregular rate and rhythm, atrial fibrillation on  cardiac monitor Respiratory: Effort normal, non-labored breathing GI: Soft.  No distension. There is no tenderness.  Skin: WDI Right groin shows some discoloration and swelling but is soft and nontender Neuro: Mental Status: Patient is awake, alert, oriented to person, place, month, year, and situation. Patient is able to give a clear and coherent history. No signs of aphasia or neglect.  No dysarthria noted.  Speech is fluent repetition and comprehension intact. Cranial Nerves: II: Visual Fields are full.  PERRL. III,IV, VI: EOMI, tracks examiner throughout visual fields. V: Facial sensation is intact and symmetric to light touch VII: Facial movement is symmetric resting and with movement VIII: Hearing is intact to voice X: Palate elevates symmetrically XI: Shoulder shrug is symmetric. XII: Tongue protrudes midline without atrophy or fasciculations.  Motor: Tone is normal. Bulk is normal. 5/5 strength was present in all four extremities without unilateral weakness or vertical drift. Sensory: Sensation is symmetric to light touch and temperature in the arms and legs. No extinction to DSS present.  Cerebellar: FNF and HKS are intact bilaterally     ASSESSMENT/PLAN [name] is a 82 y.o. male with history of HTN, AAA, EtOH abuse presenting with right hemiplegia and aphasia with findings  of occlusion/near-occlusion of the proximal left M2 MCA on vessel imaging.  Patient was not a candidate for TNK as he presented outside of the thrombolytic therapy time window from onset of symptoms but he was a candidate for mechanical thrombectomy.    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 ASPECTS is 10.  Generalized cerebral atrophy.  CTA head & neck  CTA  neck: The origin of the innominate, left common carotid, and left subclavian arteries are excluded from the field-of-view.  The visualized common carotid and internal carotid arteries are patent within the neck without stenosis.  Atherosclerotic plaque bilaterally.  The vertebral arteries are patent within the neck.  Atherosclerotic plaque at both vertebral artery origins with up to moderate stenosis. 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  Background intracranial atherosclerotic disease, as described no other proximal branch occlusion or high-grade proximal stenosis is identified. 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  A 35 mL area of ischemic penumbra in the posterior left MCA territory. MRI brain without contrast: Two small regions of acute ischemia within the left anterior cerebral artery territory.  No hemorrhage or mass effect.  Punctate focus of acute ischemia in the left cerebellum.  No left MCA territory acute ischemia. 2D Echo pending LDL 32, at goal of < 70  HgbA1c 4.8 VTE prophylaxis - SCDs  Consult cardiology for new onset atrial fibrillation     Diet   Diet Heart Room service appropriate? Yes with Assist; Fluid consistency: Thin   aspirin 81 mg daily prior to admission, now on Eliquis (apixaban) daily in the setting of new onset atrial fibrillation  Therapy recommendations:  pending  Disposition:  pending   New onset atrial fibrillation Eliquis dosing per pharmacy consult Cardiology consulted  Hypertension Home meds: lisinopril, furosemide, metoprolol tartrate Unstable, requiring cleviprex gtt  Add lisinopril 20 mg PO daily PRN hydralazine and labetalol for ongoing hypertension  SBP 120-160 mmHg for 24 hours after intervention followed by permissive hypertension  Long-term BP goal normotensive  Other Stroke Risk  Factors Advanced Age >/= 54   Other Active Problems Right groin small hematoma.  Hospital day # 2   Patient presented with sudden onset of aphasia and right hemiparesis due to left M2 occlusion and was treated with mechanical thrombectomy with excellent revascularization and clinical recovery.  MRI shows only tiny punctate left ACA and left cerebellar infarcts and clinical exam shows no deficits.  He has new onset A-fib will need anticoagulation with Eliquis at discharge.  Recommend gradual mobilization out of bed and therapy consults.  Out of bed to chair but cannot walk today.  Resume Eliquis.  Discussed with patient and Dr. Corliss Skains.  Greater than 50% time during this 50-minute visit was spent in counseling and coordination of care about his stroke, atrial fibrillation, groin hematoma and discussion with patient and care team and answering questions.     Delia Heady, MD Medical Director [facility] Pager: [phone] 09/15/2022 3:27 PM  To contact Stroke Continuity provider, please refer to [email or web address]. After hours, contact General Neurology

## 2022-09-16 DIAGNOSIS — R4701 Aphasia: Secondary | ICD-10-CM

## 2022-09-16 DIAGNOSIS — I63512 Cerebral infarction due to unspecified occlusion or stenosis of left middle cerebral artery: Secondary | ICD-10-CM

## 2022-09-16 MED ORDER — APIXABAN 5 MG PO TABS
5.0000 mg | ORAL_TABLET | Freq: Two times a day (BID) | ORAL | Status: DC
  Administered 2022-09-16 – 2022-09-17 (×3): 5 mg via ORAL
  Filled 2022-09-16 (×3): qty 1

## 2022-09-16 MED ORDER — METOPROLOL TARTRATE 50 MG PO TABS: 50.0000 mg | ORAL_TABLET | Freq: Two times a day (BID) | ORAL | Status: DC

## 2022-09-16 MED ORDER — METOPROLOL TARTRATE 25 MG PO TABS
25.0000 mg | ORAL_TABLET | Freq: Two times a day (BID) | ORAL | Status: DC
  Administered 2022-09-16 – 2022-09-17 (×2): 25 mg via ORAL
  Filled 2022-09-16 (×2): qty 1

## 2022-09-16 NOTE — Discharge Instructions (Signed)
Information on my medicine - ELIQUIS (apixaban)  This medication education was reviewed with me or my healthcare representative as part of my discharge preparation.     Why was Eliquis prescribed for you? Eliquis was prescribed for you to reduce the risk of a blood clot forming that can cause a stroke if you have a medical condition called atrial fibrillation (a type of irregular heartbeat).  What do You need to know about Eliquis ? Take your Eliquis TWICE DAILY - one tablet in the morning and one tablet in the evening with or without food. If you have difficulty swallowing the tablet whole please discuss with your pharmacist how to take the medication safely.  Take Eliquis exactly as prescribed by your doctor and DO NOT stop taking Eliquis without talking to the doctor who prescribed the medication.  Stopping may increase your risk of developing a stroke.  Refill your prescription before you run out.  After discharge, you should have regular check-up appointments with your healthcare provider that is prescribing your Eliquis.  In the future your dose may need to be changed if your kidney function or weight changes by a significant amount or as you get older.  What do you do if you miss a dose? If you miss a dose, take it as soon as you remember on the same day and resume taking twice daily.  Do not take more than one dose of ELIQUIS at the same time to make up a missed dose.  Important Safety Information A possible side effect of Eliquis is bleeding. You should call your healthcare provider right away if you experience any of the following: Bleeding from an injury or your nose that does not stop. Unusual colored urine (red or dark brown) or unusual colored stools (red or black). Unusual bruising for unknown reasons. A serious fall or if you hit your head (even if there is no bleeding).  Some medicines may interact with Eliquis and might increase your risk of bleeding or clotting  while on Eliquis. To help avoid this, consult your healthcare provider or pharmacist prior to using any new prescription or non-prescription medications, including herbals, vitamins, non-steroidal anti-inflammatory drugs (NSAIDs) and supplements.  This website has more information on Eliquis (apixaban): http://www.eliquis.com/eliquis/home =======================================  Atrial Fibrillation    Atrial fibrillation is a type of heartbeat that is irregular or fast. If you have this condition, your heart beats without any order. This makes it hard for your heart to pump blood in a normal way. Atrial fibrillation may come and go, or it may become a long-lasting problem. If this condition is not treated, it can put you at higher risk for stroke, heart failure, and other heart problems.  What are the causes? This condition may be caused by diseases that damage the heart. They include: High blood pressure. Heart failure. Heart valve disease. Heart surgery. Other causes include: Diabetes. Thyroid disease. Being overweight. Kidney disease. Sometimes the cause is not known.  What increases the risk? You are more likely to develop this condition if: You are older. You smoke. You exercise often and very hard. You have a family history of this condition. You are a man. You use drugs. You drink a lot of alcohol. You have lung conditions, such as emphysema, pneumonia, or COPD. You have sleep apnea.  What are the signs or symptoms? Common symptoms of this condition include: A feeling that your heart is beating very fast. Chest pain or discomfort. Feeling short of breath. Suddenly feeling   light-headed or weak. Getting tired easily during activity. Fainting. Sweating. In some cases, there are no symptoms.  How is this treated? Treatment for this condition depends on underlying conditions and how you feel when you have atrial fibrillation. They include: Medicines to: Prevent  blood clots. Treat heart rate or heart rhythm problems. Using devices, such as a pacemaker, to correct heart rhythm problems. Doing surgery to remove the part of the heart that sends bad signals. Closing an area where clots can form in the heart (left atrial appendage). In some cases, your doctor will treat other underlying conditions.  Follow these instructions at home:  Medicines Take over-the-counter and prescription medicines only as told by your doctor. Do not take any new medicines without first talking to your doctor. If you are taking blood thinners: Talk with your doctor before you take any medicines that have aspirin or NSAIDs, such as ibuprofen, in them. Take your medicine exactly as told by your doctor. Take it at the same time each day. Avoid activities that could hurt or bruise you. Follow instructions about how to prevent falls. Wear a bracelet that says you are taking blood thinners. Or, carry a card that lists what medicines you take. Lifestyle         Do not use any products that have nicotine or tobacco in them. These include cigarettes, e-cigarettes, and chewing tobacco. If you need help quitting, ask your doctor. Eat heart-healthy foods. Talk with your doctor about the right eating plan for you. Exercise regularly as told by your doctor. Do not drink alcohol. Lose weight if you are overweight. Do not use drugs, including cannabis.  General instructions If you have a condition that causes breathing to stop for a short period of time (apnea), treat it as told by your doctor. Keep a healthy weight. Do not use diet pills unless your doctor says they are safe for you. Diet pills may make heart problems worse. Keep all follow-up visits as told by your doctor. This is important.  Contact a doctor if: You notice a change in the speed, rhythm, or strength of your heartbeat. You are taking a blood-thinning medicine and you get more bruising. You get tired more easily  when you move or exercise. You have a sudden change in weight.  Get help right away if:    You have pain in your chest or your belly (abdomen). You have trouble breathing. You have side effects of blood thinners, such as blood in your vomit, poop (stool), or pee (urine), or bleeding that cannot stop. You have any signs of a stroke. "BE FAST" is an easy way to remember the main warning signs: B - Balance. Signs are dizziness, sudden trouble walking, or loss of balance. E - Eyes. Signs are trouble seeing or a change in how you see. F - Face. Signs are sudden weakness or loss of feeling in the face, or the face or eyelid drooping on one side. A - Arms. Signs are weakness or loss of feeling in an arm. This happens suddenly and usually on one side of the body. S - Speech. Signs are sudden trouble speaking, slurred speech, or trouble understanding what people say. T - Time. Time to call emergency services. Write down what time symptoms started. You have other signs of a stroke, such as: A sudden, very bad headache with no known cause. Feeling like you may vomit (nausea). Vomiting. A seizure.  These symptoms may be an emergency. Do not wait to   see if the symptoms will go away. Get medical help right away. Call your local emergency services (911 in the [place] Do not drive yourself to the hospital. Summary Atrial fibrillation is a type of heartbeat that is irregular or fast. You are at higher risk of this condition if you smoke, are older, have diabetes, or are overweight. Follow your doctor's instructions about medicines, diet, exercise, and follow-up visits. Get help right away if you have signs or symptoms of a stroke. Get help right away if you cannot catch your breath, or you have chest pain or discomfort. This information is not intended to replace advice given to you by your health care provider. Make sure you discuss any questions you have with your health care provider. Document  Revised: 10/03/2018 Document Reviewed: 10/03/2018 Elsevier Patient Education  2020 Elsevier Inc.    

## 2022-09-16 NOTE — Progress Notes (Signed)
Progress Note  Patient Name: [name] Date of Encounter: 09/16/2022  Primary Cardiologist: New to Dr. Izora Ribas, Plans to re-establish with [facility] (Dr. Gwen Pounds)  Subjective   Move to the floor.  No symptoms.    Inpatient Medications    Scheduled Meds:  apixaban  5 mg Oral BID   Chlorhexidine Gluconate Cloth  6 each Topical Daily   metoprolol tartrate  25 mg Oral BID   Continuous Infusions:  sodium chloride     PRN Meds: acetaminophen **OR** acetaminophen (TYLENOL) oral liquid 160 mg/5 mL **OR** acetaminophen, heparin sodium (porcine), hydrALAZINE, labetalol, ondansetron (ZOFRAN) IV, mouth rinse, senna-docusate   Vital Signs    Vitals:   09/16/22 0600 09/16/22 0700 09/16/22 0800 09/16/22 1032  BP: (!) 102/57 (!) 97/58 (!) 102/52 118/81  Pulse: 77 84 79 77  Resp: (!) 22 16 19 18   Temp:   98.2 F (36.8 C) 97.6 F (36.4 C)  TempSrc:   Oral Oral  SpO2: 98% 99% 99% 100%  Weight:      Height:        Intake/Output Summary (Last 24 hours) at 09/16/2022 1214 Last data filed at 09/16/2022 0600 Gross per 24 hour  Intake 18.67 ml  Output 550 ml  Net -531.33 ml   Filed Weights   09/13/22 1800  Weight: 83.6 kg    Telemetry    AF  Personally Reviewed  Physical Exam   Gen: no distress  Neck: No JVD Cardiac: No Rubs or Gallops, IRIR no murmur, improving R groin hematoma Respiratory: Clear to auscultation bilaterally, normal effort, normal  respiratory rate GI: Soft, nontender, non-distended  MS: No  edema;  moves all extremities Integument: Skin feels warm Neuro:  At time of evaluation, alert and oriented to person/place/time/situation    Labs    Chemistry Recent Labs  Lab 09/13/22 1842 09/13/22 1849 09/14/22 0531  NA 130* 132* 128*  K 4.6 5.2* 3.5  CL 102 103 104  CO2 18*  --  16*  GLUCOSE 105* 98 117*  BUN 28* 37* 22  CREATININE 1.50* 1.40* 1.35*  CALCIUM 8.9  --  8.2*  PROT 6.9  --   --   ALBUMIN 3.5  --   --   AST 19  --    --   ALT 10  --   --   ALKPHOS 82  --   --   BILITOT 0.7  --   --   GFRNONAA 47*  --  53*  ANIONGAP 10  --  8     Hematology Recent Labs  Lab 09/13/22 1842 09/13/22 1849 09/14/22 0531  WBC 5.3  --  7.1  RBC 3.80*  --  3.29*  HGB 12.1* 12.6* 10.4*  HCT 38.5* 37.0* 32.2*  MCV 101.3*  --  97.9  MCH 31.8  --  31.6  MCHC 31.4  --  32.3  RDW 16.3*  --  16.3*  PLT 145*  --  152    Cardiac EnzymesNo results for input(s): "TROPONINI" in the last 168 hours. No results for input(s): "TROPIPOC" in the last 168 hours.   BNPNo results for input(s): "BNP", "PROBNP" in the last 168 hours.   DDimer No results for input(s): "DDIMER" in the last 168 hours.   Radiology    ECHOCARDIOGRAM COMPLETE  Result Date: 09/14/2022    ECHOCARDIOGRAM REPORT   Patient Name:   [name] Date of Exam: 09/14/2022 Medical Rec #:  [id number]  Height:       72.0 in Accession #:    [id number]       Weight:       184.3 lb Date of Birth:  [birth year]        BSA:          2.058 m Patient Age:    81 years         BP:           125/57 mmHg Patient Gender: M                HR:           74 bpm. Exam Location:  Inpatient Procedure: 2D Echo, Cardiac Doppler and Color Doppler Indications:    Stroke  History:        Patient has prior history of Echocardiogram examinations, most                 recent 02/25/2021. Stroke and AKI, Mitral Valve Disease,                 Arrythmias:Bradycardia, Signs/Symptoms:Edema; Risk                 Factors:Hypertension and alcohol abuse.  Sonographer:    Wallie Char Referring Phys: [id number] Malachi Carl STACK IMPRESSIONS  1. Peak outflow velocity 3.3 m/s, peak gradient 45 mmHg. Left ventricular ejection fraction, by estimation, is >75%. The left ventricle has hyperdynamic function. The left ventricle has no regional wall motion abnormalities. There is mild left ventricular hypertrophy. Left ventricular diastolic parameters are indeterminate.  2. Right ventricular systolic function is  normal. The right ventricular size is normal. There is mildly elevated pulmonary artery systolic pressure. The estimated right ventricular systolic pressure is 44.4 mmHg.  3. Left atrial size was mildly dilated.  4. The mitral valve is normal in structure. Mild mitral valve regurgitation. No evidence of mitral stenosis. Moderate mitral annular calcification.  5. The aortic valve is normal in structure. There is mild calcification of the aortic valve. Aortic valve regurgitation is not visualized. Aortic valve sclerosis is present, with no evidence of aortic valve stenosis. Aortic valve Vmax measures 1.22 m/s.  6. The inferior vena cava is dilated in size with <50% respiratory variability, suggesting right atrial pressure of 15 mmHg. FINDINGS  Left Ventricle: Peak outflow velocity 3.3 m/s, peak gradient 45 mmHg. Left ventricular ejection fraction, by estimation, is >75%. The left ventricle has hyperdynamic function. The left ventricle has no regional wall motion abnormalities. The left ventricular internal cavity size was normal in size. There is mild left ventricular hypertrophy. Left ventricular diastolic parameters are indeterminate. Right Ventricle: The right ventricular size is normal. No increase in right ventricular wall thickness. Right ventricular systolic function is normal. There is mildly elevated pulmonary artery systolic pressure. The tricuspid regurgitant velocity is 2.71  m/s, and with an assumed right atrial pressure of 15 mmHg, the estimated right ventricular systolic pressure is 44.4 mmHg. Left Atrium: Left atrial size was mildly dilated. Right Atrium: Right atrial size was normal in size. Pericardium: There is no evidence of pericardial effusion. Mitral Valve: The mitral valve is normal in structure. Moderate mitral annular calcification. Mild mitral valve regurgitation. No evidence of mitral valve stenosis. MV peak gradient, 13.6 mmHg. The mean mitral valve gradient is 4.0 mmHg. Tricuspid Valve:  The tricuspid valve is normal in structure. Tricuspid valve regurgitation is not demonstrated. No evidence of tricuspid stenosis. Aortic Valve: The aortic valve is normal in structure. There  is mild calcification of the aortic valve. Aortic valve regurgitation is not visualized. Aortic valve sclerosis is present, with no evidence of aortic valve stenosis. Aortic valve mean gradient  measures 3.3 mmHg. Aortic valve peak gradient measures 6.0 mmHg. Aortic valve area, by VTI measures 5.02 cm. Pulmonic Valve: The pulmonic valve was normal in structure. Pulmonic valve regurgitation is not visualized. No evidence of pulmonic stenosis. Aorta: The aortic root is normal in size and structure. Venous: The inferior vena cava is dilated in size with less than 50% respiratory variability, suggesting right atrial pressure of 15 mmHg. IAS/Shunts: No atrial level shunt detected by color flow Doppler.  LEFT VENTRICLE PLAX 2D LVIDd:         4.50 cm     Diastology LVIDs:         3.10 cm     LV e' medial:    13.67 cm/s LV PW:         1.00 cm     LV E/e' medial:  9.6 LV IVS:        1.20 cm     LV e' lateral:   7.82 cm/s LVOT diam:     2.50 cm     LV E/e' lateral: 16.8 LV SV:         115 LV SV Index:   56 LVOT Area:     4.91 cm  LV Volumes (MOD) LV vol d, MOD A2C: 74.9 ml LV vol d, MOD A4C: 60.4 ml LV vol s, MOD A2C: 20.2 ml LV vol s, MOD A4C: 19.3 ml LV SV MOD A2C:     54.7 ml LV SV MOD A4C:     60.4 ml LV SV MOD BP:      47.1 ml RIGHT VENTRICLE             IVC RV Basal diam:  3.30 cm     IVC diam: 2.80 cm RV S prime:     12.63 cm/s TAPSE (M-mode): 1.4 cm LEFT ATRIUM             Index        RIGHT ATRIUM           Index LA diam:        4.30 cm 2.09 cm/m   RA Area:     22.80 cm LA Vol (A2C):   83.7 ml 40.67 ml/m  RA Volume:   58.40 ml  28.38 ml/m LA Vol (A4C):   81.9 ml 39.80 ml/m LA Biplane Vol: 83.4 ml 40.53 ml/m  AORTIC VALVE AV Area (Vmax):    4.47 cm AV Area (Vmean):   4.54 cm AV Area (VTI):     5.02 cm AV Vmax:            122.33 cm/s AV Vmean:          86.500 cm/s AV VTI:            0.228 m AV Peak Grad:      6.0 mmHg AV Mean Grad:      3.3 mmHg LVOT Vmax:         111.50 cm/s LVOT Vmean:        80.075 cm/s LVOT VTI:          0.233 m LVOT/AV VTI ratio: 1.02  AORTA Ao Root diam: 3.90 cm Ao Asc diam:  3.70 cm MITRAL VALVE                TRICUSPID VALVE MV Area (  PHT): 3.82 cm     TR Peak grad:   29.4 mmHg MV Area VTI:   2.89 cm     TR Vmax:        271.00 cm/s MV Peak grad:  13.6 mmHg MV Mean grad:  4.0 mmHg     SHUNTS MV Vmax:       1.85 m/s     Systemic VTI:  0.23 m MV Vmean:      87.6 cm/s    Systemic Diam: 2.50 cm MV Decel Time: 198 msec MV E velocity: 131.00 cm/s Donato Schultz MD Electronically signed by Donato Schultz MD Signature Date/Time: 09/14/2022/3:35:05 PM    Final     Patient Profile     82 y.o. male with AF and stroke s/p thrombectomy  Assessment & Plan    New onset of atrial fibrillation in the setting of stroke - returns to metoprolol 50 mg PO BID; will discharge at lower dose - started eliquis with no worsening hematoma  Dynamic LVOT gradient  - related to hyperdynamic function, would benefit from outpatient imaging follow up  Recent stroke - LDL at goal   He would like to follow up with [facility]. Cardiology will sign off; please call with concerns.  For questions or updates, please contact [facility] Please consult [email or web address] for contact info under Cardiology/STEMI.      Riley Lam, MD FASE [facility] Cardiologist [facility]  [address] [place], [address] [place], [place] [address] [phone]  12:14 PM

## 2022-09-16 NOTE — Consult Note (Signed)
Physical Medicine and Rehabilitation Consult Reason for Consult:right sided weakness and language deficits Referring Physician: Pearlean Brownie   HPI: [name] is a 82 y.o. male  With a history of hypertension, ascending aortic aneurysm, EtOH abuse who presented on 521 with right-sided weakness and word finding deficits.  Patient also found to be in atrial fibrillation.  CTA of the head and neck demonstrated a thrombus causing occlusion or near occlusion of the proximal left M2.  Patient underwent complete revascularization of the left MCA inferior division with an 054 Free climb aspiration catheter achievingTICI 3 revascularization.  MRI of the brain demonstrated 2 small regions of acute ischemia within the left anterior cerebral artery territory without hemorrhage or mass effect.  They are also aware of punctate foci of ischemia in the left cerebellum.  No left MCA territory ischemia was seen.  Neurology is following the patient and feels that the stroke was secondary to an embolism related to new onset atrial fibrillation.  Patient was placed on Eliquis for stroke prophylaxis.  Cardiology has been following.  Patient was up with therapies today and was min assist for sit to stand transfer.  He is min assist +2 for 150 feet with rolling walker.  Therapy notes wide base of support and low gait velocity.  Patient was modified independent with a cane prior to admission. Pt told me her rarely used a cane.  He was driving.  He lives at home in a 1 level house with 3 steps to enter. Daughter apparently will be back home today?    Review of Systems  Constitutional:  Positive for malaise/fatigue. Negative for fever.  HENT:  Negative for hearing loss.   Eyes:  Negative for blurred vision and double vision.  Respiratory:  Negative for cough.   Cardiovascular:  Negative for chest pain.  Gastrointestinal:  Negative for heartburn.  Genitourinary:  Positive for urgency.  Musculoskeletal:  Negative for  myalgias.  Skin:  Negative for rash.  Neurological:  Positive for focal weakness.  Psychiatric/Behavioral:  Negative for depression.    Past Medical History:  Diagnosis Date   Abnormal EKG    Alcoholism (HCC)    Ascending aortic aneurysm (HCC)    Edema of both legs    Gout    History of palpitations    Hypertension    Personal history of gout    Past Surgical History:  Procedure Laterality Date   incision of left neck     RADIOLOGY WITH ANESTHESIA N/A 09/13/2022   Procedure: IR WITH ANESTHESIA;  Surgeon: Julieanne Cotton, MD;  Location: [facility] OR;  Service: Radiology;  Laterality: N/A;   Family History  Problem Relation Age of Onset   Lung cancer Father    Social History:  reports that he has never smoked. He has never used smokeless tobacco. He reports current alcohol use of about 21.0 standard drinks of alcohol per week. He reports that he does not use drugs. Allergies:  Allergies  Allergen Reactions   Gramineae Pollens Other (See Comments)   Medications Prior to Admission  Medication Sig Dispense Refill   donepezil (ARICEPT) 5 MG tablet Take 5 mg by mouth at bedtime.     ipratropium (ATROVENT) 0.03 % nasal spray Place 2 sprays into both nostrils 2 (two) times daily.     iron polysaccharides (NIFEREX) 150 MG capsule Take 150 mg by mouth daily.     ketoconazole (NIZORAL) 2 % cream Apply 1 application topically 2 (two) times daily as  needed (rash).      lisinopril (PRINIVIL,ZESTRIL) 30 MG tablet Take 1 tablet (30 mg total) by mouth daily. 90 tablet 4   loperamide (IMODIUM) 2 MG capsule Take 2 mg by mouth as needed for diarrhea or loose stools.     metoprolol tartrate (LOPRESSOR) 50 MG tablet Take 25 mg by mouth daily.      tiZANidine (ZANAFLEX) 4 MG tablet Take 1 tablet (4 mg total) by mouth daily as needed for muscle spasms. 90 tablet 3   allopurinol (ZYLOPRIM) 300 MG tablet Take 1 tablet (300 mg total) by mouth daily. (Patient not taking: Reported on 09/15/2022) 90 tablet 3    chlorthalidone (HYGROTON) 25 MG tablet Take 25 mg by mouth daily.  (Patient not taking: Reported on 09/15/2022)     folic acid (FOLVITE) 1 MG tablet Take 1 tablet (1 mg total) by mouth daily. (Patient not taking: Reported on 09/15/2022) 30 tablet 0   furosemide (LASIX) 20 MG tablet Take 1 tablet (20 mg total) by mouth daily. (Patient not taking: Reported on 09/15/2022) 90 tablet 4   Multiple Vitamin (MULTIVITAMIN WITH MINERALS) TABS tablet Take 1 tablet by mouth daily. (Patient not taking: Reported on 09/15/2022) 30 tablet 0   sildenafil (VIAGRA) 100 MG tablet Take 0.5-1 tablets (50-100 mg total) by mouth daily as needed for erectile dysfunction. (Patient not taking: Reported on 09/15/2022) 15 tablet 6   thiamine 100 MG tablet Take 1 tablet (100 mg total) by mouth daily. (Patient not taking: Reported on 09/15/2022) 30 tablet 0    Home: Home Living Family/patient expects to be discharged to:: Private residence Living Arrangements: Alone Type of Home: House Home Access: Stairs to enter Secretary/administrator of Steps: 3 Entrance Stairs-Rails: Left Home Layout: One level Bathroom Shower/Tub: Health visitor: Standard Home Equipment: Medical laboratory scientific officer - single point, Information systems manager, BSC/3in1  Functional History: Prior Function Prior Level of Function : Independent/Modified Independent Mobility Comments: walking with a cane occasionally ADLs Comments: independent, driving, does his own medications Functional Status:  Mobility: Bed Mobility Overal bed mobility: Needs Assistance Bed Mobility: Supine to Sit Supine to sit: Min guard Sit to supine: Total assist General bed mobility comments: pt up in chair upon PT arrival Transfers Overall transfer level: Needs assistance Equipment used: Rolling walker (2 wheels) Transfers: Sit to/from Stand Sit to Stand: Min guard General transfer comment: pt used arm rests, increased time Ambulation/Gait Ambulation/Gait assistance: Min assist, +2 physical  assistance, +2 safety/equipment Gait Distance (Feet): 150 Feet Assistive device: Rolling walker (2 wheels) Gait Pattern/deviations: Step-through pattern, Decreased stride length, Wide base of support General Gait Details: generally shaky but improved with distance. Attempted ambulation without AD however very unsteady. education on walker management and safety. Pt with decreased step height and length, minA required for walke rmanagement during turning Gait velocity: dec Gait velocity interpretation: <1.31 ft/sec, indicative of household ambulator    ADL: ADL Overall ADL's : Needs assistance/impaired Eating/Feeding: Set up, Sitting Grooming: Set up, Sitting Upper Body Bathing: Min guard, Sitting Lower Body Bathing: Moderate assistance, Sitting/lateral leans, Sit to/from stand Upper Body Dressing : Min guard, Sitting Lower Body Dressing: Maximal assistance, +2 for physical assistance, +2 for safety/equipment, Sit to/from stand Lower Body Dressing Details (indicate cue type and reason): require assist for socks, min assist +2 standing Toilet Transfer: Minimal assistance, +2 for physical assistance, +2 for safety/equipment, Ambulation Toilet Transfer Details (indicate cue type and reason): did not assess Functional mobility during ADLs: Minimal assistance, +2 for physical assistance, +2 for  safety/equipment General ADL Comments: Patient presenting with potential upper level cognitive deficits, patient's evaluation shortened due to bleeding from femoral IR site and requiring pressure to be held.  Cognition: Cognition Overall Cognitive Status: No family/caregiver present to determine baseline cognitive functioning Orientation Level: Oriented X4 Safety/Judgment: Other (comment) (developing awareness and judgement) Cognition Arousal/Alertness: Awake/alert Behavior During Therapy: WFL for tasks assessed/performed Overall Cognitive Status: No family/caregiver present to determine baseline  cognitive functioning Area of Impairment: Problem solving Problem Solving: Slow processing, Requires verbal cues General Comments: pt appropriate and asking appropriate questions regarding d/c and rehab. Pt reports primary concern to be condition of his house because he left via EMT and doens't know if his house is wide open or anything. Discussed at length regarding people here in [place] that could check on his house. Pt reports he has a step dtr in law that is currently in [place]  Blood pressure 118/81, pulse 77, temperature 97.6 F (36.4 C), temperature source Oral, resp. rate 18, height 6' (1.829 m), weight 83.6 kg, SpO2 100 %. Physical Exam Constitutional:      General: He is not in acute distress. HENT:     Head: Normocephalic and atraumatic.     Right Ear: External ear normal.     Left Ear: External ear normal.     Mouth/Throat:     Mouth: Mucous membranes are moist.  Eyes:     Pupils: Pupils are equal, round, and reactive to light.  Cardiovascular:     Rate and Rhythm: Normal rate.  Pulmonary:     Effort: Pulmonary effort is normal.  Abdominal:     General: Abdomen is flat.     Palpations: Abdomen is soft.  Musculoskeletal:        General: No swelling. Normal range of motion.     Cervical back: Normal range of motion.  Skin:    Findings: Bruising present.     Comments: Mutiple bruises and lacs on all 4 limbs.   Neurological:     Mental Status: He is alert.     Comments: Alert and oriented x 3. Normal insight and awareness. Intact Memory. Speech slightly slurred and intermittent word finding deficits. Good comprehension. Cranial nerve exam unremarkable except for mild right central 7. MMT: RUE 4+/5. LUE 5/5. RLE and RLE 5/5. Sensory exam normal for light touch and pain in all 4 limbs. No limb ataxia or cerebellar signs. No abnormal tone appreciated.  Marland Kitchen    Psychiatric:        Mood and Affect: Mood normal.        Behavior: Behavior normal.     No results found for this or any  previous visit (from the past 24 hour(s)). VAS [place] GROIN PSEUDOANEURYSM  Result Date: 09/14/2022  ARTERIAL PSEUDOANEURYSM  Patient Name:  DEUNDRE ELA  Date of Exam:   09/14/2022 Medical Rec #: [id number]         Accession #:    [id number] Date of Birth: 1941-04-04         Patient Gender: M Patient Age:   51 years Exam [facility]  [facility] Procedure:      VAS [place] Bobetta Lime Referring Phys: Julieanne Cotton --------------------------------------------------------------------------------  Exam: Right groin Indications: Patient complains of bleeding post IR procedure. History: S/P Left MCA mechanical thrombectomy. Limitations: Post procedure pressure dressing Comparison Study: No previous exams Performing Technologist: Jody Hill RVT, RDMS  Examination Guidelines: A complete evaluation includes B-mode imaging, spectral Doppler, color Doppler, and power Doppler as  needed of all accessible portions of each vessel. Bilateral testing is considered an integral part of a complete examination. Limited examinations for reoccurring indications may be performed as noted. +------------+----------+---------+------+----------+ Right DuplexPSV (cm/s)Waveform PlaqueComment(s) +------------+----------+---------+------+----------+ CFA            143    biphasic                  +------------+----------+---------+------+----------+ PFA             66    biphasic                  +------------+----------+---------+------+----------+ Prox SFA        83    triphasic                 +------------+----------+---------+------+----------+ Right Vein comments:CFV patent with phasic flow.  Summary: No evidence of pseudoaneurysm, AVF or DVT  Diagnosing physician: Gerarda Fraction Electronically signed by Gerarda Fraction on 09/14/2022 at 5:38:17 PM.    --------------------------------------------------------------------------------    Final    ECHOCARDIOGRAM COMPLETE  Result Date: 09/14/2022     ECHOCARDIOGRAM REPORT   Patient Name:   JAZZIEL MCMEANS Date of Exam: 09/14/2022 Medical Rec #:  [id number]        Height:       72.0 in Accession #:    [id number]       Weight:       184.3 lb Date of Birth:  1941-01-03        BSA:          2.058 m Patient Age:    81 years         BP:           125/57 mmHg Patient Gender: M                HR:           74 bpm. Exam Location:  Inpatient Procedure: 2D Echo, Cardiac Doppler and Color Doppler Indications:    Stroke  History:        Patient has prior history of Echocardiogram examinations, most                 recent 02/25/2021. Stroke and AKI, Mitral Valve Disease,                 Arrythmias:Bradycardia, Signs/Symptoms:Edema; Risk                 Factors:Hypertension and alcohol abuse.  Sonographer:    Wallie Char Referring Phys: [id number] Malachi Carl STACK IMPRESSIONS  1. Peak outflow velocity 3.3 m/s, peak gradient 45 mmHg. Left ventricular ejection fraction, by estimation, is >75%. The left ventricle has hyperdynamic function. The left ventricle has no regional wall motion abnormalities. There is mild left ventricular hypertrophy. Left ventricular diastolic parameters are indeterminate.  2. Right ventricular systolic function is normal. The right ventricular size is normal. There is mildly elevated pulmonary artery systolic pressure. The estimated right ventricular systolic pressure is 44.4 mmHg.  3. Left atrial size was mildly dilated.  4. The mitral valve is normal in structure. Mild mitral valve regurgitation. No evidence of mitral stenosis. Moderate mitral annular calcification.  5. The aortic valve is normal in structure. There is mild calcification of the aortic valve. Aortic valve regurgitation is not visualized. Aortic valve sclerosis is present, with no evidence of aortic valve stenosis. Aortic valve Vmax measures 1.22 m/s.  6. The inferior vena cava is dilated in size with <50%  respiratory variability, suggesting right atrial pressure of 15 mmHg. FINDINGS  Left  Ventricle: Peak outflow velocity 3.3 m/s, peak gradient 45 mmHg. Left ventricular ejection fraction, by estimation, is >75%. The left ventricle has hyperdynamic function. The left ventricle has no regional wall motion abnormalities. The left ventricular internal cavity size was normal in size. There is mild left ventricular hypertrophy. Left ventricular diastolic parameters are indeterminate. Right Ventricle: The right ventricular size is normal. No increase in right ventricular wall thickness. Right ventricular systolic function is normal. There is mildly elevated pulmonary artery systolic pressure. The tricuspid regurgitant velocity is 2.71  m/s, and with an assumed right atrial pressure of 15 mmHg, the estimated right ventricular systolic pressure is 44.4 mmHg. Left Atrium: Left atrial size was mildly dilated. Right Atrium: Right atrial size was normal in size. Pericardium: There is no evidence of pericardial effusion. Mitral Valve: The mitral valve is normal in structure. Moderate mitral annular calcification. Mild mitral valve regurgitation. No evidence of mitral valve stenosis. MV peak gradient, 13.6 mmHg. The mean mitral valve gradient is 4.0 mmHg. Tricuspid Valve: The tricuspid valve is normal in structure. Tricuspid valve regurgitation is not demonstrated. No evidence of tricuspid stenosis. Aortic Valve: The aortic valve is normal in structure. There is mild calcification of the aortic valve. Aortic valve regurgitation is not visualized. Aortic valve sclerosis is present, with no evidence of aortic valve stenosis. Aortic valve mean gradient  measures 3.3 mmHg. Aortic valve peak gradient measures 6.0 mmHg. Aortic valve area, by VTI measures 5.02 cm. Pulmonic Valve: The pulmonic valve was normal in structure. Pulmonic valve regurgitation is not visualized. No evidence of pulmonic stenosis. Aorta: The aortic root is normal in size and structure. Venous: The inferior vena cava is dilated in size with less than  50% respiratory variability, suggesting right atrial pressure of 15 mmHg. IAS/Shunts: No atrial level shunt detected by color flow Doppler.  LEFT VENTRICLE PLAX 2D LVIDd:         4.50 cm     Diastology LVIDs:         3.10 cm     LV e' medial:    13.67 cm/s LV PW:         1.00 cm     LV E/e' medial:  9.6 LV IVS:        1.20 cm     LV e' lateral:   7.82 cm/s LVOT diam:     2.50 cm     LV E/e' lateral: 16.8 LV SV:         115 LV SV Index:   56 LVOT Area:     4.91 cm  LV Volumes (MOD) LV vol d, MOD A2C: 74.9 ml LV vol d, MOD A4C: 60.4 ml LV vol s, MOD A2C: 20.2 ml LV vol s, MOD A4C: 19.3 ml LV SV MOD A2C:     54.7 ml LV SV MOD A4C:     60.4 ml LV SV MOD BP:      47.1 ml RIGHT VENTRICLE             IVC RV Basal diam:  3.30 cm     IVC diam: 2.80 cm RV S prime:     12.63 cm/s TAPSE (M-mode): 1.4 cm LEFT ATRIUM             Index        RIGHT ATRIUM           Index LA diam:  4.30 cm 2.09 cm/m   RA Area:     22.80 cm LA Vol (A2C):   83.7 ml 40.67 ml/m  RA Volume:   58.40 ml  28.38 ml/m LA Vol (A4C):   81.9 ml 39.80 ml/m LA Biplane Vol: 83.4 ml 40.53 ml/m  AORTIC VALVE AV Area (Vmax):    4.47 cm AV Area (Vmean):   4.54 cm AV Area (VTI):     5.02 cm AV Vmax:           122.33 cm/s AV Vmean:          86.500 cm/s AV VTI:            0.228 m AV Peak Grad:      6.0 mmHg AV Mean Grad:      3.3 mmHg LVOT Vmax:         111.50 cm/s LVOT Vmean:        80.075 cm/s LVOT VTI:          0.233 m LVOT/AV VTI ratio: 1.02  AORTA Ao Root diam: 3.90 cm Ao Asc diam:  3.70 cm MITRAL VALVE                TRICUSPID VALVE MV Area (PHT): 3.82 cm     TR Peak grad:   29.4 mmHg MV Area VTI:   2.89 cm     TR Vmax:        271.00 cm/s MV Peak grad:  13.6 mmHg MV Mean grad:  4.0 mmHg     SHUNTS MV Vmax:       1.85 m/s     Systemic VTI:  0.23 m MV Vmean:      87.6 cm/s    Systemic Diam: 2.50 cm MV Decel Time: 198 msec MV E velocity: 131.00 cm/s Donato Schultz MD Electronically signed by Donato Schultz MD Signature Date/Time: 09/14/2022/3:35:05 PM     Final     Assessment/Plan: Diagnosis: 82 year old male with right hemiparesis and aphasia due to left M2 occlusion which was treated with mechanical thrombectomy.  MRI revealed tiny punctate left ACA and left cerebellar infarcts.  Does the need for close, 24 hr/day medical supervision in concert with the patient's rehab needs make it unreasonable for this patient to be served in a less intensive setting?  no Co-Morbidities requiring supervision/potential complications:  -New onset atrial fibrillation, patient now on Eliquis. -Hypertension -History of EtOH abuse Due to bladder management, safety, skin/wound care, and medication administration, does the patient require 24 hr/day rehab nursing? No Does the patient require coordinated care of a physician, rehab nurse, therapy disciplines of PT, OT, SLP to address physical and functional deficits in the context of the above medical diagnosis(es)? No Addressing deficits in the following areas: balance, locomotion, transferring, bathing, feeding, and toileting Can the patient actively participate in an intensive therapy program of at least 3 hrs of therapy per day at least 5 days per week? Potentially The potential for patient to make measurable gains while on inpatient rehab is  n/a Anticipated functional outcomes upon discharge from inpatient rehab are n/a  with PT, n/a with OT, n/a with SLP. Estimated rehab length of stay to reach the above functional goals is: n/a Anticipated discharge destination: Home Overall Rehab/Functional Prognosis: excellent  POST ACUTE RECOMMENDATIONS: This patient's condition is appropriate for continued rehabilitative care in the following setting: [facility] Therapy and Outpatient Therapy Patient has agreed to participate in recommended program. Yes Note that insurance prior authorization may be required for reimbursement  for recommended care.  Comment: Pt is progressing nicely. He indicates daughter will be at home. His  motor exam is almost normal now. Has some mild word finding deficits/dysarthria still. He prefers to go home. If someone will be with him, he could go home when medically appropriate.     I have personally performed a face to face diagnostic evaluation of this patient. Additionally, I have examined the patient's medical record including any pertinent labs and radiographic images. If the physician assistant has documented in this note, I have reviewed and edited or otherwise concur with the physician assistant's documentation.  Thanks,  Ranelle Oyster, MD 09/16/2022

## 2022-09-16 NOTE — TOC Initial Note (Signed)
Transition of Care [facility]) - Initial/Assessment Note    Patient Details  Name: [name] MRN: [id number] Date of Birth: 1940-11-08  Transition of Care [facility]) CM/SW Contact:    Lockie Pares, RN Phone Number: 09/16/2022, 3:28 PM  Clinical Narrative:                 Patient presented with strokelike symptoms and had a thrombectomy in IR. PT and OT initially recommended CIR however the patient stated his doctor spoke to him and he feels like he can go home. He lives alone [place] acres of land, but his daughter has a house on the property.  He is alert and oriented, having pauses in his speech when talking. He is agreeable to home health, requests a highly rated agency that works with his insurance . Becky Sax from [facility] has accepted for PT OT and RN. Will need orders closer to [place]. He requests a walker. Ordered via [employer].  He states he may have transportation problems if not being [place] this weekend, as his daughter is Programme researcher, broadcasting/film/video.  TOC will continue to follow for needs.   Expected Discharge Plan: Home w Home Health Services Barriers to Discharge: Continued Medical Work up   Patient Goals and CMS Choice     Choice offered to / list presented to : Patient      Expected Discharge Plan and Services   Discharge Planning Services: CM Consult Post Acute Care Choice: Home Health Living arrangements for the past 2 months: Single Family Home                 DME Arranged: Lyda Perone rolling DME Agency: [employer] Date DME Agency Contacted: 09/16/22 Time DME Agency Contacted: [phone] Representative spoke with at DME Agency: [place] HH Arranged: PT, OT, RN HH Agency: [facility] Date [facility] Agency Contacted: 09/16/22 Time HH Agency Contacted: [id number] Representative spoke with at [facility] Agency: Becky Sax  Prior Living Arrangements/Services Living arrangements for the past 2 months: Single Family Home Lives with:: Self (Daughter lives on property 1/4 mile away) Patient  language and need for interpreter reviewed:: Yes Do you feel safe going back to the place where you live?: Yes      Need for Family Participation in Patient Care: Yes (Comment) Care giver support system in place?: Yes (comment)   Criminal Activity/Legal Involvement Pertinent to Current Situation/Hospitalization: No - Comment as needed  Activities of Daily Living      Permission Sought/Granted   Permission granted to share information with : Yes, Verbal Permission Granted              Emotional Assessment   Attitude/Demeanor/Rapport: Gracious Affect (typically observed): Accepting Orientation: : Oriented to Self, Oriented to Place, Oriented to Situation Alcohol / Substance Use: Not Applicable Psych Involvement: No (comment)  Admission diagnosis:  Acute ischemic left MCA stroke (HCC) [I63.512] Cerebrovascular accident (CVA), unspecified mechanism (HCC) [I63.9] Middle cerebral artery embolism, left [I66.02] Patient Active Problem List   Diagnosis Date Noted   Acute ischemic left MCA stroke (HCC) 09/13/2022   Middle cerebral artery embolism, left 09/13/2022   Acute encephalopathy 01/27/2017   Chronic hyponatremia 01/27/2017   AKI (acute kidney injury) (HCC) 01/27/2017   Alcoholism (HCC) 01/27/2017   Ascending aortic aneurysm (HCC) 11/29/2016   Bradycardia 11/29/2016   Moderate mitral insufficiency 11/29/2016   Edema of both legs    Hypertension    Personal history of gout    History of palpitations  Abnormal EKG 11/01/2016   Benign essential hypertension 10/10/2016   PCP:  Barbette Reichmann, MD Pharmacy:   [facility] [id number] [place], [place] - [address] [place] [address] Phone: [phone] Fax: [phone]  [id number] - [place], [place] - [address] AT [facility] [address] [place] [address] Phone: [phone] Fax: [phone]     Social Determinants of Health (SDOH) Social  History: SDOH Screenings   Tobacco Use: Low Risk  (09/14/2022)   SDOH Interventions:     Readmission Risk Interventions     No data to display

## 2022-09-16 NOTE — Significant Event (Signed)
Accepted patient into 3W07, via wheelchair from 4N ICU. Patient A/O X 4. Wanted to sit up in the recliner. Patient oriented to new room; chair alarm is on while sitting in the recliner. Patient declined for staff notifying family that he has arrived to this new. Call bell within reach.

## 2022-09-16 NOTE — Progress Notes (Signed)
Physical Therapy Treatment Patient Details Name: [name] MRN: [id number] DOB: 07/15/1940 Today's Date: 09/16/2022   History of Present Illness Patient is a 82 yo male presenting to the ED with R arm flaccid, R sided facial droop and expressive aphasia on 09/13/22. 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 5/22 bleeding rt groin from site of sheath removal; 5/23 cleared for OOB to bathroom only  PMH includes: HTN, ascending aortic aneurysm, EtOH abuse    PT Comments    Pt progressing well. Pt able to tolerate amb 150' with RW this date however HR increased to 148bpm, RR into 40s and SpO2 at 90% on RA. Pt reports 3/4 DOE. Pt requires RW for safe ambulation at this time. PTA pt was indep and living alone with supportive hired care for driving, cooking, cleaning, and shoping. Pt to greatly benefit from inpatient rehab program of 3+ hours as he demonstrates excellent rehab potential to achieve safe mod I level of function. Acute PT to cont to follow.    Recommendations for follow up therapy are one component of a multi-disciplinary discharge planning process, led by the attending physician.  Recommendations may be updated based on patient status, additional functional criteria and insurance authorization.  Follow Up Recommendations       Assistance Recommended at Discharge PRN  Patient can return home with the following A little help with walking and/or transfers;A little help with bathing/dressing/bathroom   Equipment Recommendations  Rolling walker (2 wheels)    Recommendations for Other Services       Precautions / Restrictions Precautions Precautions: Fall Restrictions Weight Bearing Restrictions: No     Mobility  Bed Mobility               General bed mobility comments: pt up in chair upon PT arrival    Transfers Overall transfer level: Needs assistance Equipment used: Rolling walker (2 wheels) Transfers: Sit to/from Stand Sit to  Stand: Min guard           General transfer comment: pt used arm rests, increased time    Ambulation/Gait Ambulation/Gait assistance: Min assist, +2 physical assistance, +2 safety/equipment Gait Distance (Feet): 150 Feet Assistive device: Rolling walker (2 wheels) Gait Pattern/deviations: Step-through pattern, Decreased stride length, Wide base of support Gait velocity: dec Gait velocity interpretation: <1.31 ft/sec, indicative of household ambulator   General Gait Details: generally shaky but improved with distance. Attempted ambulation without AD however very unsteady. education on walker management and safety. Pt with decreased step height and length, minA required for walke rmanagement during turning   Stairs             Wheelchair Mobility    Modified Rankin (Stroke Patients Only) Modified Rankin (Stroke Patients Only) Pre-Morbid Rankin Score: No symptoms Modified Rankin: Moderate disability     Balance Overall balance assessment: Needs assistance Sitting-balance support: Feet supported, No upper extremity supported Sitting balance-Leahy Scale: Fair     Standing balance support: Bilateral upper extremity supported Standing balance-Leahy Scale: Poor Standing balance comment: relies on BUE support                            Cognition Arousal/Alertness: Awake/alert Behavior During Therapy: WFL for tasks assessed/performed Overall Cognitive Status: No family/caregiver present to determine baseline cognitive functioning  General Comments: pt appropriate and asking appropriate questions regarding d/c and rehab. Pt reports primary concern to be condition of his house because he left via EMT and doens't know if his house is wide open or anything. Discussed at length regarding people here in [place] that could check on his house. Pt reports he has a step dtr in law that is currently in [place]        Exercises       General Comments General comments (skin integrity, edema, etc.): HR increased to 146bpm, RR 44, SPO2 >90% on RA. during ambulation. s/p 3 min rest HR down to 86 bpm, RR 25, and SpO2 98% on RA      Pertinent Vitals/Pain Pain Assessment Pain Assessment: No/denies pain    Home Living                          Prior Function            PT Goals (current goals can now be found in the care plan section) Acute Rehab PT Goals Patient Stated Goal: return home PT Goal Formulation: Patient unable to participate in goal setting Time For Goal Achievement: 09/28/22 Potential to Achieve Goals: Good Progress towards PT goals: Progressing toward goals    Frequency    Min 4X/week      PT Plan Discharge plan needs to be updated    Co-evaluation              AM-PAC PT "6 Clicks" Mobility   Outcome Measure  Help needed turning from your back to your side while in a flat bed without using bedrails?: A Little Help needed moving from lying on your back to sitting on the side of a flat bed without using bedrails?: A Little Help needed moving to and from a bed to a chair (including a wheelchair)?: A Little Help needed standing up from a chair using your arms (e.g., wheelchair or bedside chair)?: A Little Help needed to walk in hospital room?: A Lot Help needed climbing 3-5 steps with a railing? : Total 6 Click Score: 15    End of Session Equipment Utilized During Treatment: Gait belt Activity Tolerance: Patient tolerated treatment well Patient left: in chair;with call bell/phone within reach;with chair alarm set Nurse Communication: Mobility status PT Visit Diagnosis: Muscle weakness (generalized) (M62.81);Difficulty in walking, not elsewhere classified (R26.2)     Time: 2956-2130 PT Time Calculation (min) (ACUTE ONLY): 25 min  Charges:  $Gait Training: 23-37 mins                     [name], PT, DPT Acute Rehabilitation Services Secure chat  preferred Office #: [phone]    [name] 09/16/2022, 8:53 AM

## 2022-09-16 NOTE — Progress Notes (Addendum)
STROKE TEAM PROGRESS NOTE   INTERVAL HISTORY No family is at the bedside. Eliquis restarted. Transfer out of ICU.  Neurological exam is stable.  No complaints.  Therapist recommend inpatient rehab  Vitals:   09/16/22 0600 09/16/22 0700 09/16/22 0800 09/16/22 1032  BP: (!) 102/57 (!) 97/58 (!) 102/52 118/81  Pulse: 77 84 79 77  Resp: (!) 22 16 19 18   Temp:   98.2 F (36.8 C) 97.6 F (36.4 C)  TempSrc:   Oral Oral  SpO2: 98% 99% 99% 100%  Weight:      Height:       CBC:  Recent Labs  Lab 09/13/22 1842 09/13/22 1849 09/14/22 0531  WBC 5.3  --  7.1  NEUTROABS 3.9  --  5.2  HGB 12.1* 12.6* 10.4*  HCT 38.5* 37.0* 32.2*  MCV 101.3*  --  97.9  PLT 145*  --  152    Basic Metabolic Panel:  Recent Labs  Lab 09/13/22 1842 09/13/22 1849 09/14/22 0531  NA 130* 132* 128*  K 4.6 5.2* 3.5  CL 102 103 104  CO2 18*  --  16*  GLUCOSE 105* 98 117*  BUN 28* 37* 22  CREATININE 1.50* 1.40* 1.35*  CALCIUM 8.9  --  8.2*    Lipid Panel:  Recent Labs  Lab 09/14/22 0531  CHOL 119  TRIG 86  HDL 70  CHOLHDL 1.7  VLDL 17  LDLCALC 32    HgbA1c:  Recent Labs  Lab 09/14/22 0531  HGBA1C 4.8    Urine Drug Screen:  Recent Labs  Lab 09/14/22 0531  LABOPIA NONE DETECTED  COCAINSCRNUR NONE DETECTED  LABBENZ NONE DETECTED  AMPHETMU NONE DETECTED  THCU NONE DETECTED  LABBARB NONE DETECTED     Alcohol Level  Recent Labs  Lab 09/13/22 1842  ETH <10    IMAGING past 24 hours No results found.  PHYSICAL EXAM Constitutional: Elderly Caucasian male who appears well-developed and well-nourished.  Psych: Affect appropriate to situation, calm and cooperative with exam Eyes: No scleral injection HENT: No OP obstrucion MSK: no joint deformities or swelling Cardiovascular: Irregular rate and rhythm, atrial fibrillation on cardiac monitor Respiratory: Effort normal, non-labored breathing GI: Soft.  No distension. There is no tenderness.  Skin: WDI Right groin shows some  discoloration and swelling but is soft and nontender  Neuro: Mental Status: Patient is awake, alert, oriented to person, place, month, year, and situation. Patient is able to give a clear and coherent history. No signs of aphasia or neglect.  No dysarthria noted.  Speech is fluent repetition and comprehension intact. Cranial Nerves: II: Visual Fields are full.  PERRL. III,IV, VI: EOMI, tracks examiner throughout visual fields. V: Facial sensation is intact and symmetric to light touch VII: Facial movement is symmetric resting and with movement VIII: Hearing is intact to voice X: Palate elevates symmetrically XI: Shoulder shrug is symmetric. XII: Tongue protrudes midline without atrophy or fasciculations.  Motor: Tone is normal. Bulk is normal. 5/5 strength was present in all four extremities without unilateral weakness or vertical drift. Sensory: Sensation is symmetric to light touch and temperature in the arms and legs. No extinction to DSS present.  Cerebellar: FNF and HKS are intact bilaterally   ASSESSMENT/PLAN Mr. [name] is a 82 y.o. male with history of HTN, AAA, EtOH abuse presenting with right hemiplegia and aphasia with findings of occlusion/near-occlusion of the proximal left M2 MCA on vessel imaging.  Patient was not a candidate for TNK as he  presented outside of the thrombolytic therapy time window from onset of symptoms but he was a candidate for mechanical thrombectomy.    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 ASPECTS is 10.  Generalized cerebral atrophy.  CTA head & neck  CTA neck: The origin of the innominate, left common carotid, and left subclavian arteries are excluded from the field-of-view.  The visualized common carotid  and internal carotid arteries are patent within the neck without stenosis.  Atherosclerotic plaque bilaterally.  The vertebral arteries are patent within the neck.  Atherosclerotic plaque at both vertebral artery origins with up to moderate stenosis. 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  Background intracranial atherosclerotic disease, as described no other proximal branch occlusion or high-grade proximal stenosis is identified. 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  A 35 mL area of ischemic penumbra in the posterior left MCA territory. MRI brain without contrast: Two small regions of acute ischemia within the left anterior cerebral artery territory.  No hemorrhage or mass effect.  Punctate focus of acute ischemia in the left cerebellum.  No left MCA territory acute ischemia. 2D Echo pending LDL 32, at goal of < 70  HgbA1c 4.8 VTE prophylaxis - Eliquis     Diet   Diet Heart Room service appropriate? Yes with Assist; Fluid consistency: Thin   aspirin 81 mg daily prior to admission, now on Eliquis (apixaban) daily in the setting of new onset atrial fibrillation  Therapy recommendations:  pending  Disposition:  pending   New onset atrial fibrillation Eliquis dosing per pharmacy consult Metoprolol 50mg  BID Cardiology consulted, signed off today Patient wishes to [facility] with [facility]  Hypertension Home meds: lisinopril, furosemide, metoprolol tartrate Off cleviprex gtt  Add lisinopril 20 mg PO daily PRN hydralazine and labetalol for ongoing hypertension  SBP 120-160 mmHg for 24 hours after intervention, now SBP <180. Long-term BP goal normotensive  Other Stroke Risk Factors Advanced Age >/= 10   Other Active Problems Right groin small hematoma. [place] negative for pseudoaneurysm  Hospital day # 3    Pt seen by Neuro  NP/APP and later by MD. Note/plan to be edited by MD as needed.    Lynnae January, DNP, AGACNP-BC [facility] Please use AMION for contact information & EPIC for messaging.  I have personally obtained history,examined this patient, reviewed notes, independently viewed imaging studies, participated in medical decision making and plan of care.ROS completed by me personally and pertinent positives fully documented  I have made any additions or clarifications directly to the above note. Agree with note above.  Mobilize out of bed.  Transfer to neurology floor bed.  Transfer to rehab when bed available.  Resume Eliquis today.  Long discussion with patient and answered questions.  Greater than 50% time during this 35-minute visit was spent in counseling and coordination of care about his stroke and groin hematoma and answering questions  Delia Heady, MD Medical Director [facility] Pager: [phone] 09/16/2022 2:56 PM   To contact Stroke Continuity provider, please refer to [email or web address]. After hours, contact General Neurology

## 2022-09-17 DIAGNOSIS — I63512 Cerebral infarction due to unspecified occlusion or stenosis of left middle cerebral artery: Secondary | ICD-10-CM

## 2022-09-17 MED ORDER — APIXABAN 5 MG PO TABS: 5.0000 mg | ORAL_TABLET | Freq: Two times a day (BID) | ORAL | 0 refills | Status: AC

## 2022-09-17 MED ORDER — MELATONIN 5 MG PO TABS: 5.0000 mg | ORAL_TABLET | Freq: Every evening | ORAL | Status: DC | PRN

## 2022-09-17 NOTE — Discharge Summary (Signed)
Stroke Discharge Summary  Patient ID: [name]   MRN: [id number]      DOB: 1940-10-15  Date of Admission: 09/13/2022 Date of Discharge: 09/17/2022  Attending Physician:  Stroke, Md, MD, Stroke MD Consultant(s):    cardiology  Patient's PCP:  Trevor Reichmann, MD  DISCHARGE DIAGNOSIS: Left MCA M2 thrombus s/p mechanical thrombectomy with TICI 3 revascularization likely secondary to embolism from new onset atrial fibrillation identified on hospital arrival Principal Problem:   Acute ischemic left MCA stroke [facility]) Active Problems:   Middle cerebral artery embolism, left   Allergies as of 09/17/2022       Reactions   Gramineae Pollens Other (See Comments)        Medication List     STOP taking these medications    chlorthalidone 25 MG tablet Commonly known as: HYGROTON   tiZANidine 4 MG tablet Commonly known as: ZANAFLEX       TAKE these medications    apixaban 5 MG Tabs tablet Commonly known as: ELIQUIS Take 1 tablet (5 mg total) by mouth 2 (two) times daily.   donepezil 5 MG tablet Commonly known as: ARICEPT Take 5 mg by mouth at bedtime.   ipratropium 0.03 % nasal spray Commonly known as: ATROVENT Place 2 sprays into both nostrils 2 (two) times daily.   iron polysaccharides 150 MG capsule Commonly known as: NIFEREX Take 150 mg by mouth daily.   ketoconazole 2 % cream Commonly known as: NIZORAL Apply 1 application topically 2 (two) times daily as needed (rash).   lisinopril 30 MG tablet Commonly known as: ZESTRIL Take 1 tablet (30 mg total) by mouth daily.   loperamide 2 MG capsule Commonly known as: IMODIUM Take 2 mg by mouth as needed for diarrhea or loose stools.   metoprolol tartrate 50 MG tablet Commonly known as: LOPRESSOR Take 25 mg by mouth daily.               Durable Medical Equipment  (From admission, onward)           Start     Ordered   09/17/22 1203  DME Walker  Once       Question Answer Comment   Walker: With 5 Inch Wheels   Patient needs a walker to treat with the following condition Acute ischemic stroke (HCC)      09/17/22 1203   09/16/22 1541  For home use only DME Walker rolling  Once       Comments: Stroke  Question Answer Comment  Walker: With 5 Inch Wheels   Patient needs a walker to treat with the following condition Weakness      09/16/22 1542            LABORATORY STUDIES CBC    Component Value Date/Time   WBC 7.1 09/14/2022 0531   RBC 3.29 (L) 09/14/2022 0531   HGB 10.4 (L) 09/14/2022 0531   HCT 32.2 (L) 09/14/2022 0531   PLT 152 09/14/2022 0531   MCV 97.9 09/14/2022 0531   MCH 31.6 09/14/2022 0531   MCHC 32.3 09/14/2022 0531   RDW 16.3 (H) 09/14/2022 0531   LYMPHSABS 0.4 (L) 09/14/2022 0531   MONOABS 0.6 09/14/2022 0531   EOSABS 0.9 (H) 09/14/2022 0531   BASOSABS 0.0 09/14/2022 0531   CMP    Component Value Date/Time   NA 128 (L) 09/14/2022 0531   K 3.5 09/14/2022 0531   CL 104 09/14/2022 0531   CO2 16 (L) 09/14/2022  0531   GLUCOSE 117 (H) 09/14/2022 0531   BUN 22 09/14/2022 0531   CREATININE 1.35 (H) 09/14/2022 0531   CALCIUM 8.2 (L) 09/14/2022 0531   PROT 6.9 09/13/2022 1842   ALBUMIN 3.5 09/13/2022 1842   AST 19 09/13/2022 1842   ALT 10 09/13/2022 1842   ALKPHOS 82 09/13/2022 1842   BILITOT 0.7 09/13/2022 1842   GFRNONAA 53 (L) 09/14/2022 0531   GFRAA >60 01/29/2017 0243   COAGS Lab Results  Component Value Date   INR 1.3 (H) 09/13/2022   Lipid Panel    Component Value Date/Time   CHOL 119 09/14/2022 0531   TRIG 86 09/14/2022 0531   HDL 70 09/14/2022 0531   CHOLHDL 1.7 09/14/2022 0531   VLDL 17 09/14/2022 0531   LDLCALC 32 09/14/2022 0531   HgbA1C  Lab Results  Component Value Date   HGBA1C 4.8 09/14/2022   Urinalysis    Component Value Date/Time   COLORURINE STRAW (A) 09/14/2022 0531   APPEARANCEUR CLEAR 09/14/2022 0531   LABSPEC 1.039 (H) 09/14/2022 0531   PHURINE 5.0 09/14/2022 0531   GLUCOSEU NEGATIVE  09/14/2022 0531   HGBUR MODERATE (A) 09/14/2022 0531   BILIRUBINUR NEGATIVE 09/14/2022 0531   KETONESUR 5 (A) 09/14/2022 0531   PROTEINUR NEGATIVE 09/14/2022 0531   NITRITE NEGATIVE 09/14/2022 0531   LEUKOCYTESUR NEGATIVE 09/14/2022 0531   Urine Drug Screen     Component Value Date/Time   LABOPIA NONE DETECTED 09/14/2022 0531   COCAINSCRNUR NONE DETECTED 09/14/2022 0531   LABBENZ NONE DETECTED 09/14/2022 0531   AMPHETMU NONE DETECTED 09/14/2022 0531   THCU NONE DETECTED 09/14/2022 0531   LABBARB NONE DETECTED 09/14/2022 0531    Alcohol Level    Component Value Date/Time   ETH <10 09/13/2022 1842     SIGNIFICANT DIAGNOSTIC STUDIES VAS [place] GROIN PSEUDOANEURYSM  Result Date: 09/14/2022  ARTERIAL PSEUDOANEURYSM  Patient Name:  [name]  Date of Exam:   09/14/2022 Medical Rec #: [id number]         Accession #:    [id number] Date of Birth: [birth year]         Patient Gender: M Patient Age:   82 years Exam Location:  [facility] Procedure:      VAS [place] Bobetta Lime Referring Phys: Julieanne Cotton --------------------------------------------------------------------------------  Exam: Right groin Indications: Patient complains of bleeding post IR procedure. History: S/P Left MCA mechanical thrombectomy. Limitations: Post procedure pressure dressing Comparison Study: No previous exams Performing Technologist: Jody Hill RVT, RDMS  Examination Guidelines: A complete evaluation includes B-mode imaging, spectral Doppler, color Doppler, and power Doppler as needed of all accessible portions of each vessel. Bilateral testing is considered an integral part of a complete examination. Limited examinations for reoccurring indications may be performed as noted. +------------+----------+---------+------+----------+ Right DuplexPSV (cm/s)Waveform PlaqueComment(s) +------------+----------+---------+------+----------+ CFA            143    biphasic                   +------------+----------+---------+------+----------+ PFA             66    biphasic                  +------------+----------+---------+------+----------+ Prox SFA        83    triphasic                 +------------+----------+---------+------+----------+ Right Vein comments:CFV patent with phasic flow.  Summary: No evidence of pseudoaneurysm, AVF or  DVT  Diagnosing physician: Gerarda Fraction Electronically signed by Gerarda Fraction on 09/14/2022 at 5:38:17 PM.    --------------------------------------------------------------------------------    Final    ECHOCARDIOGRAM COMPLETE  Result Date: 09/14/2022    ECHOCARDIOGRAM REPORT   Patient Name:   JOVAHN WOHLWEND Date of Exam: 09/14/2022 Medical Rec #:  [id number]        Height:       72.0 in Accession #:    [id number]       Weight:       184.3 lb Date of Birth:  1940/11/10        BSA:          2.058 m Patient Age:    81 years         BP:           125/57 mmHg Patient Gender: M                HR:           74 bpm. Exam Location:  Inpatient Procedure: 2D Echo, Cardiac Doppler and Color Doppler Indications:    Stroke  History:        Patient has prior history of Echocardiogram examinations, most                 recent 02/25/2021. Stroke and AKI, Mitral Valve Disease,                 Arrythmias:Bradycardia, Signs/Symptoms:Edema; Risk                 Factors:Hypertension and alcohol abuse.  Sonographer:    Wallie Char Referring Phys: [id number] Malachi Carl STACK IMPRESSIONS  1. Peak outflow velocity 3.3 m/s, peak gradient 45 mmHg. Left ventricular ejection fraction, by estimation, is >75%. The left ventricle has hyperdynamic function. The left ventricle has no regional wall motion abnormalities. There is mild left ventricular hypertrophy. Left ventricular diastolic parameters are indeterminate.  2. Right ventricular systolic function is normal. The right ventricular size is normal. There is mildly elevated pulmonary artery systolic pressure. The estimated right  ventricular systolic pressure is 44.4 mmHg.  3. Left atrial size was mildly dilated.  4. The mitral valve is normal in structure. Mild mitral valve regurgitation. No evidence of mitral stenosis. Moderate mitral annular calcification.  5. The aortic valve is normal in structure. There is mild calcification of the aortic valve. Aortic valve regurgitation is not visualized. Aortic valve sclerosis is present, with no evidence of aortic valve stenosis. Aortic valve Vmax measures 1.22 m/s.  6. The inferior vena cava is dilated in size with <50% respiratory variability, suggesting right atrial pressure of 15 mmHg. FINDINGS  Left Ventricle: Peak outflow velocity 3.3 m/s, peak gradient 45 mmHg. Left ventricular ejection fraction, by estimation, is >75%. The left ventricle has hyperdynamic function. The left ventricle has no regional wall motion abnormalities. The left ventricular internal cavity size was normal in size. There is mild left ventricular hypertrophy. Left ventricular diastolic parameters are indeterminate. Right Ventricle: The right ventricular size is normal. No increase in right ventricular wall thickness. Right ventricular systolic function is normal. There is mildly elevated pulmonary artery systolic pressure. The tricuspid regurgitant velocity is 2.71  m/s, and with an assumed right atrial pressure of 15 mmHg, the estimated right ventricular systolic pressure is 44.4 mmHg. Left Atrium: Left atrial size was mildly dilated. Right Atrium: Right atrial size was normal in size. Pericardium: There is no evidence of pericardial effusion. Mitral Valve: The mitral  valve is normal in structure. Moderate mitral annular calcification. Mild mitral valve regurgitation. No evidence of mitral valve stenosis. MV peak gradient, 13.6 mmHg. The mean mitral valve gradient is 4.0 mmHg. Tricuspid Valve: The tricuspid valve is normal in structure. Tricuspid valve regurgitation is not demonstrated. No evidence of tricuspid  stenosis. Aortic Valve: The aortic valve is normal in structure. There is mild calcification of the aortic valve. Aortic valve regurgitation is not visualized. Aortic valve sclerosis is present, with no evidence of aortic valve stenosis. Aortic valve mean gradient  measures 3.3 mmHg. Aortic valve peak gradient measures 6.0 mmHg. Aortic valve area, by VTI measures 5.02 cm. Pulmonic Valve: The pulmonic valve was normal in structure. Pulmonic valve regurgitation is not visualized. No evidence of pulmonic stenosis. Aorta: The aortic root is normal in size and structure. Venous: The inferior vena cava is dilated in size with less than 50% respiratory variability, suggesting right atrial pressure of 15 mmHg. IAS/Shunts: No atrial level shunt detected by color flow Doppler.  LEFT VENTRICLE PLAX 2D LVIDd:         4.50 cm     Diastology LVIDs:         3.10 cm     LV e' medial:    13.67 cm/s LV PW:         1.00 cm     LV E/e' medial:  9.6 LV IVS:        1.20 cm     LV e' lateral:   7.82 cm/s LVOT diam:     2.50 cm     LV E/e' lateral: 16.8 LV SV:         115 LV SV Index:   56 LVOT Area:     4.91 cm  LV Volumes (MOD) LV vol d, MOD A2C: 74.9 ml LV vol d, MOD A4C: 60.4 ml LV vol s, MOD A2C: 20.2 ml LV vol s, MOD A4C: 19.3 ml LV SV MOD A2C:     54.7 ml LV SV MOD A4C:     60.4 ml LV SV MOD BP:      47.1 ml RIGHT VENTRICLE             IVC RV Basal diam:  3.30 cm     IVC diam: 2.80 cm RV S prime:     12.63 cm/s TAPSE (M-mode): 1.4 cm LEFT ATRIUM             Index        RIGHT ATRIUM           Index LA diam:        4.30 cm 2.09 cm/m   RA Area:     22.80 cm LA Vol (A2C):   83.7 ml 40.67 ml/m  RA Volume:   58.40 ml  28.38 ml/m LA Vol (A4C):   81.9 ml 39.80 ml/m LA Biplane Vol: 83.4 ml 40.53 ml/m  AORTIC VALVE AV Area (Vmax):    4.47 cm AV Area (Vmean):   4.54 cm AV Area (VTI):     5.02 cm AV Vmax:           122.33 cm/s AV Vmean:          86.500 cm/s AV VTI:            0.228 m AV Peak Grad:      6.0 mmHg AV Mean Grad:       3.3 mmHg LVOT Vmax:         111.50  cm/s LVOT Vmean:        80.075 cm/s LVOT VTI:          0.233 m LVOT/AV VTI ratio: 1.02  AORTA Ao Root diam: 3.90 cm Ao Asc diam:  3.70 cm MITRAL VALVE                TRICUSPID VALVE MV Area (PHT): 3.82 cm     TR Peak grad:   29.4 mmHg MV Area VTI:   2.89 cm     TR Vmax:        271.00 cm/s MV Peak grad:  13.6 mmHg MV Mean grad:  4.0 mmHg     SHUNTS MV Vmax:       1.85 m/s     Systemic VTI:  0.23 m MV Vmean:      87.6 cm/s    Systemic Diam: 2.50 cm MV Decel Time: 198 msec MV E velocity: 131.00 cm/s Donato Schultz MD Electronically signed by Donato Schultz MD Signature Date/Time: 09/14/2022/3:35:05 PM    Final    MR BRAIN WO CONTRAST  Result Date: 09/14/2022 CLINICAL DATA:  Left MCA stroke.  Status post left M2 thrombectomy. EXAM: MRI HEAD WITHOUT CONTRAST TECHNIQUE: Multiplanar, multiecho pulse sequences of the brain and surrounding structures were obtained without intravenous contrast. COMPARISON:  CTA head neck 09/13/2022 FINDINGS: Brain: There are 2 small regions of abnormal diffusion restriction within the left anterior cerebral artery territory. No acute ischemia in the left MCA territory. Punctate focus of acute ischemia in the left cerebellum. No acute or chronic hemorrhage. Normal white matter signal, parenchymal volume and CSF spaces. The midline structures are normal. Vascular: Major flow voids are preserved. Skull and upper cervical spine: Normal calvarium and skull base. Visualized upper cervical spine and soft tissues are normal. Sinuses/Orbits:No paranasal sinus fluid levels or advanced mucosal thickening. No mastoid or middle ear effusion. Normal orbits. IMPRESSION: 1. Two small regions of acute ischemia within the left anterior cerebral artery territory. No hemorrhage or mass effect. 2. Punctate focus of acute ischemia in the left cerebellum. 3. No left MCA territory acute ischemia. Electronically Signed   By: Deatra Robinson M.D.   On: 09/14/2022 01:57   CT ANGIO HEAD  NECK W WO CM W PERF (CODE STROKE)  Result Date: 09/13/2022 CLINICAL DATA:  Right-sided weakness EXAM: CT ANGIOGRAPHY HEAD AND NECK CT PERFUSION BRAIN TECHNIQUE: Multidetector CT imaging of the head and neck was performed using the standard protocol during bolus administration of intravenous contrast. Multiplanar CT image reconstructions and MIPs were obtained to evaluate the vascular anatomy. Carotid stenosis measurements (when applicable) are obtained utilizing NASCET criteria, using the distal internal carotid diameter as the denominator. Multiphase CT imaging of the brain was performed following IV bolus contrast injection. Subsequent parametric perfusion maps were calculated using RAPID software. RADIATION DOSE REDUCTION: This exam was performed according to the departmental dose-optimization program which includes automated exposure control, adjustment of the mA and/or kV according to patient size and/or use of iterative reconstruction technique. CONTRAST:  OMNIPAQUE IOHEXOL 350 MG/ML SOLN COMPARISON:  09/13/2022 at 7:03 p.m. FINDINGS: CTA NECK FINDINGS SKELETON: There is no bony spinal canal stenosis. No lytic or blastic lesion. OTHER NECK: Normal pharynx, larynx and major salivary glands. No cervical lymphadenopathy. Unremarkable thyroid gland. UPPER CHEST: No pneumothorax or pleural effusion. No nodules or masses. AORTIC ARCH: There is no calcific atherosclerosis of the aortic arch. There is no aneurysm, dissection or hemodynamically significant stenosis of the visualized portion of the aorta.  Conventional 3 vessel aortic branching pattern. The visualized proximal subclavian arteries are widely patent. RIGHT CAROTID SYSTEM: Normal without aneurysm, dissection or stenosis. LEFT CAROTID SYSTEM: Normal without aneurysm, dissection or stenosis. VERTEBRAL ARTERIES: Left dominant configuration. Both origins are clearly patent. There is no dissection, occlusion or flow-limiting stenosis to the skull base  (V1-V3 segments). CTA HEAD FINDINGS POSTERIOR CIRCULATION: --Vertebral arteries: Normal V4 segments. --Inferior cerebellar arteries: Normal. --Basilar artery: Normal. --Superior cerebellar arteries: Normal. --Posterior cerebral arteries (PCA): Normal. ANTERIOR CIRCULATION: --Intracranial internal carotid arteries: Atherosclerotic calcification of the internal carotid arteries at the skull base without hemodynamically significant stenosis. --Anterior cerebral arteries (ACA): Normal. Both A1 segments are present. Patent anterior communicating artery (a-comm). --Middle cerebral arteries (MCA): Unchanged near occlusive stenosis of the left MCA proximal M2 segment. Normal right MCA. VENOUS SINUSES: As permitted by contrast timing, patent. ANATOMIC VARIANTS: Fetal origin of the right posterior cerebral artery. Patent left P-comm. Review of the MIP images confirms the above findings. CT Brain Perfusion Findings: ASPECTS: 10 CBF (<30%) Volume: 12mL Perfusion (Tmax>6.0s) volume: 47mL Mismatch Volume: 35mL Infarction Location:Posterior left MCA territory. IMPRESSION: 1. Unchanged near occlusive stenosis of the left MCA proximal M2 segment. 2. A 35 mL area of ischemic penumbra in the posterior left MCA territory. These results were called by telephone at the time of interpretation on 09/13/2022 at 7:41 pm to provider [facility] , who verbally acknowledged these results. Electronically Signed   By: Deatra Robinson M.D.   On: 09/13/2022 19:42   CT ANGIO HEAD NECK W WO CM (CODE STROKE)  Result Date: 09/13/2022 CLINICAL DATA:  Provided history: Neuro deficit, acute, stroke suspected. Left MCA syndrome. Right-sided flaccid, aphasic. EXAM: CT ANGIOGRAPHY HEAD AND NECK WITH AND WITHOUT CONTRAST TECHNIQUE: Multidetector CT imaging of the head and neck was performed using the standard protocol during bolus administration of intravenous contrast. Multiplanar CT image reconstructions and MIPs were obtained to evaluate the vascular  anatomy. Carotid stenosis measurements (when applicable) are obtained utilizing NASCET criteria, using the distal internal carotid diameter as the denominator. RADIATION DOSE REDUCTION: This exam was performed according to the departmental dose-optimization program which includes automated exposure control, adjustment of the mA and/or kV according to patient size and/or use of iterative reconstruction technique. CONTRAST:  75mL OMNIPAQUE IOHEXOL 350 MG/ML SOLN COMPARISON:  Noncontrast head CT performed earlier today 09/13/2022. FINDINGS: CTA NECK FINDINGS Aortic arch: Atherosclerotic plaque within the visualized proximal major branch vessels of the neck. The origins of the innominate, left common carotid and left subclavian arteries are excluded from the field of view. No hemodynamically significant innominate or proximal subclavian artery stenosis at the imaged levels. Right carotid system: CCA and ICA patent within the neck without stenosis. Atherosclerotic plaque about the carotid bifurcation and within the proximal ICA. Left carotid system: The visualized common carotid and internal carotid arteries are patent within the neck without stenosis. Atherosclerotic plaque about the carotid bifurcation and within the proximal ICA. Vertebral arteries: The vertebral arteries patent within the neck. Atherosclerotic plaque at the bilateral vertebral artery origins with up to moderate stenosis. Skeleton: Spondylosis of the cervical and visualized upper thoracic levels. Slight C3-C4 grade 1 retrolisthesis. Other neck: No neck mass or cervical lymphadenopathy. Upper chest: No consolidation within the imaged lung apices. Review of the MIP images confirms the above findings CTA HEAD FINDINGS Anterior circulation: The intracranial internal carotid arteries are patent. Non-stenotic atherosclerotic plaque within both vessels. The M1 middle cerebral arteries are patent. Thrombus within a proximal M2 left MCA vessel  with resultant  occlusion or near occlusive stenosis (for instance as seen on series 11, image 20). No right M2 proximal branch occlusion or high-grade proximal stenosis identified. The anterior cerebral arteries are patent. No intracranial aneurysm is identified. Posterior circulation: The intracranial vertebral arteries are patent. The basilar artery is patent. The posterior cerebral arteries are patent. Atherosclerotic irregularity of both vessels without high-grade proximal stenosis. The right PCA is fetal in origin. A left posterior communicating artery is present. Venous sinuses: Evaluation for dural venous sinus thrombosis is limited due to contrast timing. Anatomic variants: As described Review of the MIP images confirms the above findings CTA head impression #1 called by telephone at the time of interpretation on 09/13/2022 at 7:04 pm to provider [facility] , who verbally acknowledged these results. IMPRESSION: CTA neck: 1. The origins of the innominate, left common carotid and left subclavian arteries are excluded from the field of view. 2. The visualized common carotid and internal carotid arteries are patent within the neck without stenosis. Atherosclerotic plaque bilaterally, as described. 3. The vertebral arteries are patent within the neck. Atherosclerotic plaque at both vertebral artery origins with up to moderate stenosis. CTA head: 1. Thrombus within a proximal M2 left middle cerebral artery vessel with resultant occlusion or near occlusive stenosis. 2. Background intracranial atherosclerotic disease, as described. No other proximal branch occlusion or high-grade proximal stenosis is identified. Electronically Signed   By: Jackey Loge D.O.   On: 09/13/2022 19:27   CT HEAD CODE STROKE WO CONTRAST  Result Date: 09/13/2022 CLINICAL DATA:  Code stroke. Neuro deficit, acute, stroke suspected. Right-sided weakness. EXAM: CT HEAD WITHOUT CONTRAST TECHNIQUE: Contiguous axial images were obtained from the base of  the skull through the vertex without intravenous contrast. RADIATION DOSE REDUCTION: This exam was performed according to the departmental dose-optimization program which includes automated exposure control, adjustment of the mA and/or kV according to patient size and/or use of iterative reconstruction technique. COMPARISON:  Head CT 02/15/2017. FINDINGS: Brain: Generalized cerebral atrophy. Commensurate prominence of the ventricles and sulci. There is no acute intracranial hemorrhage. No demarcated cortical infarct. No extra-axial fluid collection. No evidence of an intracranial mass. No midline shift. Vascular: No hyperdense vessel.  Atherosclerotic calcifications. Skull: No fracture or aggressive osseous lesion. Sinuses/Orbits: No mass or acute finding within the imaged orbits. Mild mucosal thickening within the right maxillary sinus. Small mucous retention cyst within the left maxillary sinus. Mild mucosal thickening within the bilateral ethmoid sinuses. ASPECTS [facility] Stroke Program Early CT Score) - Ganglionic level infarction (caudate, lentiform nuclei, internal capsule, insula, M1-M3 cortex): 7 - Supraganglionic infarction (M4-M6 cortex): 3 Total score (0-10 with 10 being normal): 10 No evidence of an acute intracranial abnormality. These results were called by telephone at the time of interpretation on 09/13/2022 at 7:04 pm to provider Dr. Selina Cooley, Who verbally acknowledged these results. IMPRESSION: 1. No evidence of an acute intracranial abnormality. ASPECTS is 10. 2. Generalized cerebral atrophy. 3. Paranasal sinus disease as described. Electronically Signed   By: Jackey Loge D.O.   On: 09/13/2022 19:09      HISTORY OF PRESENT ILLNESS Mr. RAESEAN SEELEY is a 82 y.o. male with history of HTN, AAA, EtOH abuse presenting with right hemiplegia and aphasia with findings of occlusion/near-occlusion of the proximal left M2 MCA on vessel imaging.  Patient was not a candidate for TNK as he presented outside  of the thrombolytic therapy time window from onset of symptoms but he was a candidate for mechanical thrombectomy.  HOSPITAL COURSE Stroke:  occlusive/near occlusive left MCA M2 thrombus s/p mechanical thrombectomy with TICI 3 revascularization likely secondary to embolism from new onset atrial fibrillation identified on hospital arrival Code Stroke CT head no evidence of acute intracranial abnormality. ASPECTS is 10.  Generalized cerebral atrophy.  CTA head & neck  CTA neck: The origin of the innominate, left common carotid, and left subclavian arteries are excluded from the field-of-view.  The visualized common carotid and internal carotid arteries are patent within the neck without stenosis.  Atherosclerotic plaque bilaterally.  The vertebral arteries are patent within the neck.  Atherosclerotic plaque at both vertebral artery origins with up to moderate stenosis. 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  Background intracranial atherosclerotic disease, as described no other proximal branch occlusion or high-grade proximal stenosis is identified. 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  A 35 mL area of ischemic penumbra in the posterior left MCA territory. MRI brain without contrast: Two small regions of acute ischemia within the left anterior cerebral artery territory.  No hemorrhage or mass effect.  Punctate focus of acute ischemia in the left cerebellum.  No left MCA territory acute ischemia. 2D Echo pending LDL 32, at goal of < 70  HgbA1c 4.8 VTE prophylaxis - Eliquis Diet: Heart Healthy aspirin 81 mg daily prior to admission, now on Eliquis (apixaban) daily in the setting of new onset atrial fibrillation  Therapy recommendations:  Home Health PT/OT Disposition:  home  New onset atrial fibrillation Eliquis dosing per pharmacy  consult Metoprolol 50mg  BID Cardiology consulted, signed off Patient wishes to follow-up with [facility]   Hypertension Home meds: lisinopril, furosemide, metoprolol tartrate Long-term BP goal normotensive   Other Stroke Risk Factors Advanced Age >/= 59   Other Active Problems Right groin small hematoma. [place] negative for pseudoaneurysm     DISCHARGE EXAM Blood pressure (!) 156/93, pulse 93, temperature 98.8 F (37.1 C), temperature source Oral, resp. rate 16, height 6' (1.829 m), weight 83.6 kg, SpO2 98 %. Constitutional: Elderly Caucasian male who appears well-developed and well-nourished.  Psych: Affect appropriate to situation, calm and cooperative with exam Eyes: No scleral injection HENT: No OP obstrucion MSK: no joint deformities or swelling Cardiovascular: Irregular rate and rhythm, atrial fibrillation on cardiac monitor Respiratory: Effort normal, non-labored breathing GI: Soft.  No distension. There is no tenderness.  Skin: WDI Right groin shows some discoloration and swelling but is soft and nontender   Neuro: Mental Status: Patient is awake, alert, oriented to person, place, month, year, and situation. Patient is able to give a clear and coherent history. No signs of aphasia or neglect.  No dysarthria noted.  Speech is fluent. Naming, repetition and comprehension intact. Cranial Nerves: II: Visual Fields are full.  PERRL. III,IV, VI: EOMI, tracks examiner throughout visual fields. V: Facial sensation is intact and symmetric to light touch VII: Facial movement is symmetric resting and with movement VIII: Hearing is intact to voice X: Palate elevates symmetrically XI: Shoulder shrug is symmetric. XII: Tongue protrudes midline without atrophy or fasciculations.  Motor: Tone is normal. Bulk is normal. 5/5 strength was present in all four extremities without unilateral weakness or vertical drift. Sensory: Sensation is symmetric to light touch and  temperature in the arms and legs. No extinction to DSS present.  Cerebellar: FNF and HKS are intact bilaterally. LLE mild ataxia  Discharge Diet  Diet   Diet Heart Room service appropriate? Yes with Assist; Fluid consistency: Thin   liquids  DISCHARGE PLAN Disposition:  home with home health Eliquis (apixaban) daily for secondary stroke prevention  Ongoing stroke risk factor control by Primary Care Physician at time of discharge Follow-up PCP Trevor Reichmann, MD in 2 weeks. Follow-up in [facility] in 8 weeks, office to schedule an appointment.   35 minutes were spent preparing discharge.   Pt seen by Neuro NP/APP and later by MD. Note/plan to be edited by MD as needed.    Lynnae January, DNP, AGACNP-BC [facility] Please use AMION for contact information & EPIC for messaging.

## 2022-09-17 NOTE — TOC Transition Note (Signed)
Transition of Care [facility]) - CM/SW Discharge Note   Patient Details  Name: [name] MRN: [id number] Date of Birth: Jul 24, 1940  Transition of Care [facility]) CM/SW Contact:  Lawerance Sabal, RN Phone Number: 09/17/2022, 12:37 PM   Clinical Narrative:     Notified [facility] that patient will DC today.  30 day Eliquis card taken to patient.     Barriers to Discharge: Continued Medical Work up   Patient Goals and CMS Choice   Choice offered to / list presented to : Patient  Discharge Placement                         Discharge Plan and Services Additional resources added to the After Visit Summary for     Discharge Planning Services: CM Consult Post Acute Care Choice: Home Health          DME Arranged: Lyda Perone rolling DME Agency: [employer] Date DME Agency Contacted: 09/16/22 Time DME Agency Contacted: [phone] Representative spoke with at DME Agency: [place] [facility] Arranged: PT, OT, RN [facility] Agency: [employer] [facility] Date [facility] Agency Contacted: 09/16/22 Time HH Agency Contacted: [phone] Representative spoke with at [facility] Agency: Becky Sax  Social Determinants of Health (SDOH) Interventions SDOH Screenings   Tobacco Use: Low Risk  (09/14/2022)     Readmission Risk Interventions     No data to display

## 2022-09-17 NOTE — Progress Notes (Signed)
Physical Therapy Treatment Patient Details Name: [name] MRN: [id number] DOB: June 20, 1940 Today's Date: 09/17/2022   History of Present Illness Patient is a 82 yo male presenting to the ED with R arm flaccid, R sided facial droop and expressive aphasia on 09/13/22. 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 5/22 bleeding rt groin from site of sheath removal; 5/23 cleared for OOB to bathroom only  PMH includes: HTN, ascending aortic aneurysm, EtOH abuse    PT Comments    Pt progressing well towards all goals. Pt with improved ambulation tolerance and gait pattern with RW. Pt denies SOB this date like he had yesterday. Pt also able to safely navigate stairs to enter home. Continue to recommend 24/7 supervision initially as pt lives alone and now requires use of RW for safe ambulation as he is at increased risk of falling. Acute PT to cont to help.    Recommendations for follow up therapy are one component of a multi-disciplinary discharge planning process, led by the attending physician.  Recommendations may be updated based on patient status, additional functional criteria and insurance authorization.  Follow Up Recommendations       Assistance Recommended at Discharge PRN, 24/7 supervision initially  Patient can return home with the following A little help with walking and/or transfers;A little help with bathing/dressing/bathroom   Equipment Recommendations  Rolling walker (2 wheels)    Recommendations for Other Services       Precautions / Restrictions Precautions Precautions: Fall Restrictions Weight Bearing Restrictions: No     Mobility  Bed Mobility               General bed mobility comments: pt up in chair upon PT arrival    Transfers Overall transfer level: Needs assistance Equipment used: Rolling walker (2 wheels) Transfers: Sit to/from Stand Sit to Stand: Min guard           General transfer comment: pt used arm rests,  increased time    Ambulation/Gait Ambulation/Gait assistance: Min guard Gait Distance (Feet): 200 Feet Assistive device: Rolling walker (2 wheels) Gait Pattern/deviations: Step-through pattern, Decreased stride length, Wide base of support Gait velocity: dec     General Gait Details: improved step length compared to previous session. Pt's HR 108 bpm and SpO2 >97% on RA. Pt denies SOB like he experienced yesterday   Stairs Stairs: Yes Stairs assistance: Min guard Stair Management: One rail Left, Step to pattern, Forwards Number of Stairs: 4 General stair comments: definite use of L HR, increased time   Wheelchair Mobility    Modified Rankin (Stroke Patients Only) Modified Rankin (Stroke Patients Only) Pre-Morbid Rankin Score: No symptoms Modified Rankin: Moderate disability     Balance Overall balance assessment: Needs assistance Sitting-balance support: Feet supported, No upper extremity supported Sitting balance-Leahy Scale: Fair Sitting balance - Comments: on EOB briefly till return to supine due to R groin bleeding   Standing balance support: Bilateral upper extremity supported Standing balance-Leahy Scale: Fair Standing balance comment: relies on BUE support                            Cognition Arousal/Alertness: Awake/alert Behavior During Therapy: WFL for tasks assessed/performed Overall Cognitive Status: Within Functional Limits for tasks assessed                                 General Comments: pt alert, no  delayed processing or response time today, reports his dtr in law is coming to get him and take him home        Exercises      General Comments General comments (skin integrity, edema, etc.): VSS      Pertinent Vitals/Pain Pain Assessment Pain Assessment: No/denies pain    Home Living                          Prior Function            PT Goals (current goals can now be found in the care plan section)  Acute Rehab PT Goals PT Goal Formulation: Patient unable to participate in goal setting Time For Goal Achievement: 09/28/22 Potential to Achieve Goals: Good Progress towards PT goals: Progressing toward goals    Frequency    Min 4X/week      PT Plan Discharge plan needs to be updated    Co-evaluation              AM-PAC PT "6 Clicks" Mobility   Outcome Measure  Help needed turning from your back to your side while in a flat bed without using bedrails?: A Little Help needed moving from lying on your back to sitting on the side of a flat bed without using bedrails?: A Little Help needed moving to and from a bed to a chair (including a wheelchair)?: A Little Help needed standing up from a chair using your arms (e.g., wheelchair or bedside chair)?: A Little Help needed to walk in hospital room?: A Little Help needed climbing 3-5 steps with a railing? : A Little 6 Click Score: 18    End of Session Equipment Utilized During Treatment: Gait belt Activity Tolerance: Patient tolerated treatment well Patient left: in chair;with call bell/phone within reach;with chair alarm set Nurse Communication: Mobility status PT Visit Diagnosis: Muscle weakness (generalized) (M62.81);Difficulty in walking, not elsewhere classified (R26.2)     Time: 1202-1220 PT Time Calculation (min) (ACUTE ONLY): 18 min  Charges:  $Gait Training: 8-22 mins                     [name], PT, DPT Acute Rehabilitation Services Secure chat preferred Office #: [phone]    [name] 09/17/2022, 12:54 PM

## 2022-09-20 ENCOUNTER — Telehealth: Payer: Self-pay

## 2022-09-20 NOTE — Patient Outreach (Signed)
Received a red flag Emmi stroke notification for [name]. I have assigned Roshanda Florance, RN to call for follow up and determine if there are any Case Management needs.    Square [place]ett, CBCS, CMAA [facility] Care Management Assistant [facility] Care Management [phone]  

## 2022-09-20 NOTE — Patient Outreach (Signed)
  Emmi Stroke Care Coordination Follow Up  09/20/2022 Name:  [name] MRN:  [id number] DOB:  1940-09-30  Subjective: [name] is a 82 y.o. year old male who is a primary care patient of Barbette Reichmann, MD  An Emmi alert was received indicating patient responded to questions: Scheduled a follow-up appointment?.  I reached out by phone to follow up on the alert and spoke to Patient. Patient states he is doing well since returning home and has had no issues or concerns. He has ben up walking with walker. Appetite fair. LBM was when he was in the hospital-reviewed bowel mgmt with pt. He admits he has not been eating a lot of foods high in fiber but will adjust diet. He has not heard from [facility] yet. Patient aware to call office if he does not hear from them in next 24hrs. He confirms he has all his meds and no issues/concerns regarding them. Patient is going to see PCP for follow up appt tomorrow. He voices that he prefers to stay within the [facility] for all providers so he will discuss with MD about making referral to [facility] for cardiology and neurology. He denies any RN CM needs or concerns at this time.   Care Coordination Interventions:  Yes, provided  TOC Interventions Today    Flowsheet Row Most Recent Value  TOC Interventions   TOC Interventions Discussed/Reviewed TOC Interventions Discussed, Post discharge activity limitations per provider      Interventions Today    Flowsheet Row Most Recent Value  Chronic Disease   Chronic disease during today's visit Other  [stroke]  General Interventions   General Interventions Discussed/Reviewed Doctor Visits, General Interventions Discussed  Doctor Visits Discussed/Reviewed Doctor Visits Discussed, Specialist, PCP  PCP/Specialist Visits Compliance with follow-up visit  Education Interventions   Education Provided Provided Education  Provided Verbal Education On Nutrition, When to see the doctor, Medication  Nutrition  Interventions   Nutrition Discussed/Reviewed Adding fruits and vegetables, Decreasing salt, Decreasing fats  Pharmacy Interventions   Pharmacy Dicussed/Reviewed Pharmacy Topics Discussed, Medications and their functions  Safety Interventions   Safety Discussed/Reviewed Safety Discussed, Home Safety  Home Safety Assistive Devices       Follow up plan: Advised patient that they would continue to get automated EMMI-Stroke post discharge calls to assess how they are doing following recent hospitalization and will receive a call from a nurse if any of their responses were abnormal. Patient voiced understanding and was appreciative of f/u call.   Encounter Outcome:  Pt. Visit Completed   Alessandra Grout [facility] Care Management Care Management Community Coordinator Direct Phone: [phone] Toll Free: [phone] Fax: [phone]

## 2022-09-22 ENCOUNTER — Telehealth: Payer: Self-pay

## 2022-09-22 NOTE — Patient Outreach (Signed)
  Emmi Stroke Care Coordination Follow Up  09/22/2022 Name:  [name] MRN:  [id number] DOB:  04-Sep-1940  Notification received from [facility] CMA that patient called office requesting to speak with a nurse. Return call to patient .He voices that he had not heard from [facility] agency-[facility] so he called to follow up as he was advised to do. Patient states that he was told that they were unable to accept order due to pt's change in insurance from [employer] to [facility]. He states he was told hey were sending [facility] referral to another agency but patient unable to recall name.   RN CM contacted [facility] listed on d/c [phone]). Number is for corporate and provided with local number of [place] branch [phone] which took pt's referral order. Call placed to number-no answer and unable to leave message.   Second attempt to number a few minutes alter-RN CM spoke with Aurther Loft. She looked pt info and reported that due to pt's address/county referral was sent to another branch. She will contact other [facility] branches and call RN CM back with update on status of referral.  12:30pm-Received incoming call from [place] from [place]. She voices that she spoke with Elnita Maxwell at [place] branch and discussed case. She voices that due to pt's address there was confusion and referral sent to wrong office in [place]. It has since been corrected and changed to [place] office. She confirms office received it today and they will call to update patient and hopefully they can be out to see patient in the next few days.  12:50pm-Contacted patient back to provide and update.   Care Coordination Interventions:  Yes, provided    TOC Interventions Today    Flowsheet Row Most Recent Value  TOC Interventions   TOC Interventions Discussed/Reviewed Contacted Home Health RN/OT/PT      Interventions Today    Flowsheet Row Most Recent Value  General Interventions   Doctor Visits Discussed/Reviewed Doctor Visits Reviewed,  PCP, Specialist  Safety Interventions   Home Safety Contact provider for referral to PT/OT       Follow up plan: Advised patient that they would continue to get automated EMMI-Stroke post discharge calls to assess how they are doing following recent hospitalization and will receive a call from a nurse if any of their responses were abnormal. Patient voiced understanding and was appreciative of f/u call.   Encounter Outcome:  Pt. Visit Completed    Alessandra Grout [facility] Care Management Care Management Community Coordinator Direct Phone: [phone] Toll Free: [phone] Fax: [phone]

## 2022-09-27 ENCOUNTER — Telehealth: Payer: Self-pay

## 2022-09-27 NOTE — Patient Outreach (Signed)
  Emmi Stroke Care Coordination Follow Up  09/27/2022 Name:  [name] MRN:  [id number] DOB:  July 11, 1940   RN CM received message from [facility] CMA that patient called office wanting to speak with RN regarding [facility] services. Outreach call to patient. Patient states he is doing well. He voices that [facility] was out to see him on Friday. Patient states no one has been back out since. Advised patient that per orders therapy only needed/ordered 1x/wk for about 4wks. Contacted [facility]-spoke with Danetta to see when therapist would be out to see patient this week. Advised that Laura,PT is assigned to patient's case and will be out Thurs for weekly visit. Patient made aware of this and voiced understanding. No further needs or concerns at this time.   Care Coordination Interventions:  Yes, provided  TOC Interventions Today    Flowsheet Row Most Recent Value  TOC Interventions   TOC Interventions Discussed/Reviewed Contacted Home Health RN/OT/PT  [[facility] [phone]]        Follow up plan:  Patient to call for any other needs or concerns.    Encounter Outcome:  Pt. Visit Completed   Alessandra Grout [facility] Care Management Care Management Community Coordinator Direct Phone: [phone] Toll Free: [phone] Fax: [phone]

## 2022-12-23 ENCOUNTER — Other Ambulatory Visit: Payer: Self-pay

## 2022-12-23 NOTE — Patient Outreach (Signed)
Telephone outreach to patient to obtain mRS was successfully completed. MRS= 0  [name] [facility] Care Management Assistant [phone]  

## 2022-12-28 ENCOUNTER — Inpatient Hospital Stay: Payer: 59 | Admitting: Neurology

## 2022-12-28 ENCOUNTER — Encounter: Payer: Self-pay | Admitting: Neurology

## 2023-01-02 ENCOUNTER — Telehealth: Payer: Self-pay | Admitting: Neurology

## 2023-01-02 NOTE — Telephone Encounter (Signed)
Pt called to dispute no show for appt. Pt said, called and cancelled the appt and did not no show.   Informed the pt will not be charged a no show fee. Ask pt if wanted to reschedule the appt. Pt said no and do not contact me again.

## 2023-04-26 DEATH — deceased

## 2023-10-21 ENCOUNTER — Encounter (HOSPITAL_COMMUNITY): Payer: Self-pay | Admitting: Interventional Radiology
